# Patient Record
Sex: Male | Born: 1950
Health system: Southern US, Community
[De-identification: ages and names within clinical notes are randomized; demographics above are authoritative.]

## PROBLEM LIST (undated history)

## (undated) DIAGNOSIS — H9190 Unspecified hearing loss, unspecified ear: Secondary | ICD-10-CM

## (undated) DIAGNOSIS — K859 Acute pancreatitis without necrosis or infection, unspecified: Secondary | ICD-10-CM

## (undated) DIAGNOSIS — I10 Essential (primary) hypertension: Secondary | ICD-10-CM

## (undated) HISTORY — PX: CHOLECYSTECTOMY: SHX55

## (undated) HISTORY — PX: JOINT REPLACEMENT: SHX530

---

## 1999-09-22 ENCOUNTER — Encounter: Payer: Self-pay | Admitting: Orthopedic Surgery

## 1999-09-25 ENCOUNTER — Inpatient Hospital Stay (HOSPITAL_COMMUNITY): Admission: RE | Admit: 1999-09-25 | Discharge: 1999-09-29 | Payer: Self-pay | Admitting: Orthopedic Surgery

## 1999-10-25 ENCOUNTER — Encounter: Admission: RE | Admit: 1999-10-25 | Discharge: 2000-01-23 | Payer: Self-pay | Admitting: Orthopedic Surgery

## 1999-11-15 ENCOUNTER — Inpatient Hospital Stay (HOSPITAL_COMMUNITY): Admission: RE | Admit: 1999-11-15 | Discharge: 1999-11-20 | Payer: Self-pay | Admitting: Orthopedic Surgery

## 2001-04-24 ENCOUNTER — Ambulatory Visit (HOSPITAL_COMMUNITY): Admission: RE | Admit: 2001-04-24 | Discharge: 2001-04-24 | Payer: Self-pay | Admitting: *Deleted

## 2002-10-15 ENCOUNTER — Encounter: Payer: Self-pay | Admitting: Emergency Medicine

## 2002-10-16 ENCOUNTER — Inpatient Hospital Stay (HOSPITAL_COMMUNITY): Admission: EM | Admit: 2002-10-16 | Discharge: 2002-10-23 | Payer: Self-pay | Admitting: Emergency Medicine

## 2002-10-17 ENCOUNTER — Encounter: Payer: Self-pay | Admitting: Internal Medicine

## 2004-05-12 ENCOUNTER — Ambulatory Visit (HOSPITAL_COMMUNITY): Admission: RE | Admit: 2004-05-12 | Discharge: 2004-05-12 | Payer: Self-pay | Admitting: *Deleted

## 2015-01-26 ENCOUNTER — Other Ambulatory Visit: Payer: Self-pay | Admitting: Gastroenterology

## 2021-01-02 DIAGNOSIS — I10 Essential (primary) hypertension: Secondary | ICD-10-CM | POA: Diagnosis not present

## 2021-01-02 DIAGNOSIS — R634 Abnormal weight loss: Secondary | ICD-10-CM | POA: Diagnosis not present

## 2021-01-02 DIAGNOSIS — R7301 Impaired fasting glucose: Secondary | ICD-10-CM | POA: Diagnosis not present

## 2021-01-03 ENCOUNTER — Other Ambulatory Visit: Payer: Self-pay | Admitting: Family Medicine

## 2021-01-03 DIAGNOSIS — R6881 Early satiety: Secondary | ICD-10-CM

## 2021-01-03 DIAGNOSIS — R634 Abnormal weight loss: Secondary | ICD-10-CM

## 2021-01-16 ENCOUNTER — Other Ambulatory Visit: Payer: Self-pay

## 2021-01-17 ENCOUNTER — Ambulatory Visit
Admission: RE | Admit: 2021-01-17 | Discharge: 2021-01-17 | Disposition: A | Discharge status: Home / Self Care | Payer: Medicare Other | Source: Ambulatory Visit | Attending: Family Medicine | Admitting: Family Medicine

## 2021-01-17 DIAGNOSIS — R634 Abnormal weight loss: Secondary | ICD-10-CM | POA: Diagnosis not present

## 2021-01-17 DIAGNOSIS — R6881 Early satiety: Secondary | ICD-10-CM | POA: Diagnosis not present

## 2021-01-17 DIAGNOSIS — Z9049 Acquired absence of other specified parts of digestive tract: Secondary | ICD-10-CM | POA: Diagnosis not present

## 2021-01-17 DIAGNOSIS — K7689 Other specified diseases of liver: Secondary | ICD-10-CM | POA: Diagnosis not present

## 2021-01-19 ENCOUNTER — Other Ambulatory Visit: Payer: Self-pay | Admitting: Family Medicine

## 2021-01-19 DIAGNOSIS — R6881 Early satiety: Secondary | ICD-10-CM

## 2021-01-19 DIAGNOSIS — R634 Abnormal weight loss: Secondary | ICD-10-CM

## 2021-01-30 ENCOUNTER — Ambulatory Visit
Admission: RE | Admit: 2021-01-30 | Discharge: 2021-01-30 | Disposition: A | Discharge status: Home / Self Care | Payer: Medicare Other | Source: Ambulatory Visit | Attending: Family Medicine | Admitting: Family Medicine

## 2021-01-30 ENCOUNTER — Other Ambulatory Visit: Payer: Self-pay | Admitting: Family Medicine

## 2021-01-30 DIAGNOSIS — R634 Abnormal weight loss: Secondary | ICD-10-CM

## 2021-01-30 DIAGNOSIS — R6881 Early satiety: Secondary | ICD-10-CM

## 2021-01-30 DIAGNOSIS — R140 Abdominal distension (gaseous): Secondary | ICD-10-CM | POA: Diagnosis not present

## 2021-07-11 DIAGNOSIS — I10 Essential (primary) hypertension: Secondary | ICD-10-CM | POA: Diagnosis not present

## 2021-07-11 DIAGNOSIS — Z1389 Encounter for screening for other disorder: Secondary | ICD-10-CM | POA: Diagnosis not present

## 2021-07-11 DIAGNOSIS — R7301 Impaired fasting glucose: Secondary | ICD-10-CM | POA: Diagnosis not present

## 2021-07-11 DIAGNOSIS — Z0000 Encounter for general adult medical examination without abnormal findings: Secondary | ICD-10-CM | POA: Diagnosis not present

## 2021-08-05 ENCOUNTER — Inpatient Hospital Stay (HOSPITAL_BASED_OUTPATIENT_CLINIC_OR_DEPARTMENT_OTHER)
Admission: EM | Admit: 2021-08-05 | Discharge: 2021-08-12 | DRG: 439 | Disposition: A | Discharge status: Home / Self Care | Payer: Medicare Other | Attending: Internal Medicine | Admitting: Internal Medicine

## 2021-08-05 ENCOUNTER — Other Ambulatory Visit: Payer: Self-pay

## 2021-08-05 ENCOUNTER — Encounter (HOSPITAL_BASED_OUTPATIENT_CLINIC_OR_DEPARTMENT_OTHER): Payer: Self-pay

## 2021-08-05 ENCOUNTER — Inpatient Hospital Stay (HOSPITAL_COMMUNITY): Payer: Medicare Other

## 2021-08-05 ENCOUNTER — Emergency Department (HOSPITAL_BASED_OUTPATIENT_CLINIC_OR_DEPARTMENT_OTHER): Payer: Medicare Other

## 2021-08-05 DIAGNOSIS — K8050 Calculus of bile duct without cholangitis or cholecystitis without obstruction: Secondary | ICD-10-CM | POA: Diagnosis not present

## 2021-08-05 DIAGNOSIS — R7989 Other specified abnormal findings of blood chemistry: Secondary | ICD-10-CM | POA: Diagnosis not present

## 2021-08-05 DIAGNOSIS — Y838 Other surgical procedures as the cause of abnormal reaction of the patient, or of later complication, without mention of misadventure at the time of the procedure: Secondary | ICD-10-CM | POA: Diagnosis present

## 2021-08-05 DIAGNOSIS — Z9049 Acquired absence of other specified parts of digestive tract: Secondary | ICD-10-CM

## 2021-08-05 DIAGNOSIS — K625 Hemorrhage of anus and rectum: Secondary | ICD-10-CM | POA: Diagnosis not present

## 2021-08-05 DIAGNOSIS — K851 Biliary acute pancreatitis without necrosis or infection: Secondary | ICD-10-CM | POA: Diagnosis not present

## 2021-08-05 DIAGNOSIS — R7401 Elevation of levels of liver transaminase levels: Secondary | ICD-10-CM

## 2021-08-05 DIAGNOSIS — K9186 Retained cholelithiasis following cholecystectomy: Secondary | ICD-10-CM | POA: Diagnosis not present

## 2021-08-05 DIAGNOSIS — Z885 Allergy status to narcotic agent status: Secondary | ICD-10-CM | POA: Diagnosis not present

## 2021-08-05 DIAGNOSIS — I10 Essential (primary) hypertension: Secondary | ICD-10-CM

## 2021-08-05 DIAGNOSIS — R109 Unspecified abdominal pain: Secondary | ICD-10-CM | POA: Diagnosis not present

## 2021-08-05 DIAGNOSIS — K8511 Biliary acute pancreatitis with uninfected necrosis: Principal | ICD-10-CM | POA: Diagnosis present

## 2021-08-05 DIAGNOSIS — Z79899 Other long term (current) drug therapy: Secondary | ICD-10-CM

## 2021-08-05 DIAGNOSIS — K828 Other specified diseases of gallbladder: Secondary | ICD-10-CM | POA: Diagnosis not present

## 2021-08-05 DIAGNOSIS — K567 Ileus, unspecified: Secondary | ICD-10-CM

## 2021-08-05 DIAGNOSIS — K8689 Other specified diseases of pancreas: Secondary | ICD-10-CM | POA: Diagnosis not present

## 2021-08-05 DIAGNOSIS — K5900 Constipation, unspecified: Secondary | ICD-10-CM | POA: Diagnosis present

## 2021-08-05 DIAGNOSIS — R17 Unspecified jaundice: Secondary | ICD-10-CM

## 2021-08-05 DIAGNOSIS — Z20822 Contact with and (suspected) exposure to covid-19: Secondary | ICD-10-CM | POA: Diagnosis not present

## 2021-08-05 DIAGNOSIS — H9190 Unspecified hearing loss, unspecified ear: Secondary | ICD-10-CM | POA: Diagnosis present

## 2021-08-05 DIAGNOSIS — K859 Acute pancreatitis without necrosis or infection, unspecified: Secondary | ICD-10-CM | POA: Diagnosis present

## 2021-08-05 DIAGNOSIS — D696 Thrombocytopenia, unspecified: Secondary | ICD-10-CM | POA: Diagnosis not present

## 2021-08-05 DIAGNOSIS — R1110 Vomiting, unspecified: Secondary | ICD-10-CM | POA: Diagnosis not present

## 2021-08-05 DIAGNOSIS — K8020 Calculus of gallbladder without cholecystitis without obstruction: Secondary | ICD-10-CM | POA: Diagnosis not present

## 2021-08-05 DIAGNOSIS — K838 Other specified diseases of biliary tract: Secondary | ICD-10-CM | POA: Diagnosis not present

## 2021-08-05 DIAGNOSIS — R1084 Generalized abdominal pain: Secondary | ICD-10-CM | POA: Diagnosis not present

## 2021-08-05 HISTORY — DX: Essential (primary) hypertension: I10

## 2021-08-05 LAB — COMPREHENSIVE METABOLIC PANEL
ALT: 277 U/L — ABNORMAL HIGH (ref 0–44)
AST: 279 U/L — ABNORMAL HIGH (ref 15–41)
Albumin: 4.1 g/dL (ref 3.5–5.0)
Alkaline Phosphatase: 129 U/L — ABNORMAL HIGH (ref 38–126)
Anion gap: 13 (ref 5–15)
BUN: 17 mg/dL (ref 8–23)
CO2: 24 mmol/L (ref 22–32)
Calcium: 9.8 mg/dL (ref 8.9–10.3)
Chloride: 101 mmol/L (ref 98–111)
Creatinine, Ser: 0.99 mg/dL (ref 0.61–1.24)
GFR, Estimated: >60 mL/min (ref >60)
Glucose, Bld: 147 mg/dL — ABNORMAL HIGH (ref 70–99)
Potassium: 4 mmol/L (ref 3.5–5.1)
Sodium: 138 mmol/L (ref 135–145)
Total Bilirubin: 3.9 mg/dL — ABNORMAL HIGH (ref 0.3–1.2)
Total Protein: 7 g/dL (ref 6.5–8.1)

## 2021-08-05 LAB — CBC WITH DIFFERENTIAL/PLATELET
Abs Immature Granulocytes: 0.04 10*3/uL (ref 0.00–0.07)
Basophils Absolute: 0 10*3/uL (ref 0.0–0.1)
Basophils Relative: 0 %
Eosinophils Absolute: 0 10*3/uL (ref 0.0–0.5)
Eosinophils Relative: 0 %
HCT: 42 % (ref 39.0–52.0)
Hemoglobin: 14.2 g/dL (ref 13.0–17.0)
Immature Granulocytes: 0 %
Lymphocytes Relative: 3 %
Lymphs Abs: 0.4 10*3/uL — ABNORMAL LOW (ref 0.7–4.0)
MCH: 33 pg (ref 26.0–34.0)
MCHC: 33.8 g/dL (ref 30.0–36.0)
MCV: 97.7 fL (ref 80.0–100.0)
Monocytes Absolute: 0.6 10*3/uL (ref 0.1–1.0)
Monocytes Relative: 4 %
Neutro Abs: 12.3 10*3/uL — ABNORMAL HIGH (ref 1.7–7.7)
Neutrophils Relative %: 93 %
Platelets: 153 10*3/uL (ref 150–400)
RBC: 4.3 MIL/uL (ref 4.22–5.81)
RDW: 13.7 % (ref 11.5–15.5)
WBC: 13.2 10*3/uL — ABNORMAL HIGH (ref 4.0–10.5)
nRBC: 0 % (ref 0.0–0.2)

## 2021-08-05 LAB — RESP PANEL BY RT-PCR (FLU A&B, COVID) ARPGX2
Influenza A by PCR: NEGATIVE
Influenza B by PCR: NEGATIVE
SARS Coronavirus 2 by RT PCR: NEGATIVE

## 2021-08-05 LAB — LIPASE, BLOOD: Lipase: 1649 U/L — ABNORMAL HIGH (ref 11–51)

## 2021-08-05 LAB — TRIGLYCERIDES: Triglycerides: 39 mg/dL (ref <150)

## 2021-08-05 MED ORDER — IOHEXOL 350 MG/ML SOLN
50.0000 mL | Freq: Once | INTRAVENOUS | Status: AC | PRN
  Administered 2021-08-05: 50 mL via INTRAVENOUS

## 2021-08-05 MED ORDER — HYDROMORPHONE HCL 1 MG/ML IJ SOLN
0.5000 mg | INTRAMUSCULAR | Status: DC | PRN
  Administered 2021-08-06 – 2021-08-09 (×13): 0.5 mg via INTRAVENOUS
  Filled 2021-08-05 (×13): qty 0.5

## 2021-08-05 MED ORDER — ONDANSETRON HCL 4 MG/2ML IJ SOLN
4.0000 mg | Freq: Once | INTRAMUSCULAR | Status: AC
  Administered 2021-08-05: 4 mg via INTRAVENOUS
  Filled 2021-08-05: qty 2

## 2021-08-05 MED ORDER — HYDROMORPHONE HCL 1 MG/ML IJ SOLN
0.5000 mg | Freq: Once | INTRAMUSCULAR | Status: AC
  Administered 2021-08-05: 0.5 mg via INTRAVENOUS
  Filled 2021-08-05: qty 1

## 2021-08-05 MED ORDER — ONDANSETRON HCL 4 MG/2ML IJ SOLN
4.0000 mg | Freq: Once | INTRAMUSCULAR | Status: AC
  Administered 2021-08-05: 4 mg via INTRAVENOUS

## 2021-08-05 MED ORDER — FENTANYL CITRATE (PF) 100 MCG/2ML IJ SOLN
50.0000 ug | Freq: Once | INTRAMUSCULAR | Status: AC
Start: 2021-08-05 — End: 2021-08-05
  Administered 2021-08-05: 50 ug via INTRAVENOUS
  Filled 2021-08-05: qty 2

## 2021-08-05 MED ORDER — LACTATED RINGERS IV SOLN
INTRAVENOUS | Status: DC
  Administered 2021-08-07: 150 mL/h via INTRAVENOUS

## 2021-08-05 MED ORDER — HYDROMORPHONE HCL 1 MG/ML IJ SOLN: 0.5000 mg | INTRAMUSCULAR | Status: DC | PRN

## 2021-08-05 MED ORDER — ENOXAPARIN SODIUM 40 MG/0.4ML IJ SOSY
40.0000 mg | PREFILLED_SYRINGE | INTRAMUSCULAR | Status: DC
  Administered 2021-08-06 – 2021-08-11 (×6): 40 mg via SUBCUTANEOUS
  Filled 2021-08-05 (×7): qty 0.4

## 2021-08-05 MED ORDER — FENTANYL CITRATE PF 50 MCG/ML IJ SOSY
12.5000 ug | PREFILLED_SYRINGE | Freq: Once | INTRAMUSCULAR | Status: AC
  Administered 2021-08-05: 12.5 ug via INTRAVENOUS
  Filled 2021-08-05: qty 1

## 2021-08-05 MED ORDER — ONDANSETRON HCL 4 MG/2ML IJ SOLN
4.0000 mg | Freq: Once | INTRAMUSCULAR | Status: DC
Start: 2021-08-05 — End: 2021-08-12
  Filled 2021-08-05 (×2): qty 2

## 2021-08-05 MED ORDER — ONDANSETRON HCL 4 MG/2ML IJ SOLN
4.0000 mg | Freq: Four times a day (QID) | INTRAMUSCULAR | Status: DC | PRN
  Administered 2021-08-06 – 2021-08-07 (×4): 4 mg via INTRAVENOUS
  Filled 2021-08-05 (×4): qty 2

## 2021-08-05 MED ORDER — PROCHLORPERAZINE EDISYLATE 10 MG/2ML IJ SOLN
5.0000 mg | Freq: Once | INTRAMUSCULAR | Status: AC
  Administered 2021-08-05: 5 mg via INTRAVENOUS
  Filled 2021-08-05: qty 2

## 2021-08-05 MED ORDER — MORPHINE SULFATE (PF) 2 MG/ML IV SOLN
1.0000 mg | INTRAVENOUS | Status: DC | PRN
  Filled 2021-08-05: qty 1

## 2021-08-05 NOTE — Progress Notes (Signed)
Pt arrived to floor, MD notified.  

## 2021-08-05 NOTE — ED Notes (Signed)
Handoff report given to [facility] 

## 2021-08-05 NOTE — ED Notes (Signed)
Handoff report given to Pharmacologist at [facility]

## 2021-08-05 NOTE — H&P (Signed)
History and Physical    Hunter Ward [id number] DOB: 29-Oct-1951 DOA: 08/05/2021  PCP: Gaynelle Arabian, MD  Patient coming from:[facility]  I have personally briefly reviewed patient's old medical records in [place]  Chief Complaint: abdominal pain  HPI: Hunter Ward is a 70 y.o. male with medical history significant for HTN and cholecystectomy presenting with abdominal pain and N/V.  Pt is deaf and required virtual sign language interpreter.  However her history was limited since there was certain questions that per the interpreter patient did not answer appropriately.  He states he had abdominal pain sometime in the past.  Has had nausea and vomiting.  No diarrhea.  Points to pain in the epigastric area.  Did not answer appropriately when asked medical history and only pointed IV machine wanting pain medication.  Denies any new recent medications.  Denies alcohol use.  [place] unclear of his answer when asked if he had elevated cholesterol levels.   In the ED he was afebrile, normotensive.  Lipase >1600. CT abd showing acute pancreatitis with retained stone in gallbladder fossa and CBD around 39m.  AST of 279, ALT of 277, alkaline phosphatase 129, total bilirubin 3.9. Denies alcohol use. WBC 13.    Review of Systems: Unable to fully obtain due to language barrier even with virtual interpreter Past Medical History:  Diagnosis Date   Hypertension     Past Surgical History:  Procedure Laterality Date   CHOLECYSTECTOMY     JOINT REPLACEMENT Bilateral      reports that he has never smoked. He has never used smokeless tobacco. He reports that he does not drink alcohol and does not use drugs. Social History  Allergies  Allergen Reactions   Codeine Itching and Nausea And Vomiting    History reviewed. No pertinent family history.   Prior to Admission medications   Medication Sig Start Date End Date Taking? Authorizing Provider  lisinopril (ZESTRIL) 20 MG  tablet Take 20 mg by mouth daily. 05/29/21  Yes [provider]  NIFEdipine (PROCARDIA-XL/NIFEDICAL-XL) 30 MG 24 hr tablet Take 30 mg by mouth daily. 05/29/21  Yes [provider]    Physical Exam: Vitals:   08/05/21 1700 08/05/21 1710 08/05/21 1805 08/05/21 2109  BP: 94/71 140/77 139/77 (!) 151/94  Pulse: (!) 102 75 69 68  Resp: '18 14 15 16  '$ Temp:    98.7 F (37.1 C)  TempSrc:    Oral  SpO2: 100% 100% 100% 100%  Weight:      Height:        Constitutional: NAD, calm, elderly male in some discomfort due to abdominal pain Vitals:   08/05/21 1700 08/05/21 1710 08/05/21 1805 08/05/21 2109  BP: 94/71 140/77 139/77 (!) 151/94  Pulse: (!) 102 75 69 68  Resp: '18 14 15 16  '$ Temp:    98.7 F (37.1 C)  TempSrc:    Oral  SpO2: 100% 100% 100% 100%  Weight:      Height:       Eyes: PERRL, lids and conjunctivae normal ENMT: Mucous membranes are moist.  Neck: normal, supple Respiratory: clear to auscultation bilaterally, no wheezing, no crackles. Normal respiratory effort. No accessory muscle use.  Cardiovascular: Regular rate and rhythm, no murmurs / rubs / gallops. No extremity edema.  Abdomen: Diffuse mild tenderness with voluntary guarding but no rebound tenderness, or rigidity, no masses palpated. Bowel sounds positive.  Musculoskeletal: no clubbing / cyanosis. No joint deformity upper and lower extremities. Good ROM, no  contractures. Normal muscle tone.  Skin: scattered linear excoriations on the distal pretibial region of bilateral lower extremity Neurologic: CN 2-12 grossly intact. Sensation intact,  Strength 5/5 in all 4.  Psychiatric:  Alert and oriented x 3. Normal mood.     Labs on Admission: I have personally reviewed following labs and imaging studies  CBC: Recent Labs  Lab 08/05/21 1632  WBC 13.2*  NEUTROABS 12.3*  HGB 14.2  HCT 42.0  MCV 97.7  PLT 0000000   Basic Metabolic Panel: Recent Labs  Lab 08/05/21 1632  NA 138  K 4.0  CL 101  CO2 24   GLUCOSE 147*  BUN 17  CREATININE 0.99  CALCIUM 9.8   GFR: Estimated Creatinine Clearance: 71.5 mL/min (by C-G formula based on SCr of 0.99 mg/dL). Liver Function Tests: Recent Labs  Lab 08/05/21 1632  AST 279*  ALT 277*  ALKPHOS 129*  BILITOT 3.9*  PROT 7.0  ALBUMIN 4.1   Recent Labs  Lab 08/05/21 1632  LIPASE 1,649*   No results for input(s): AMMONIA in the last 168 hours. Coagulation Profile: No results for input(s): INR, PROTIME in the last 168 hours. Cardiac Enzymes: No results for input(s): CKTOTAL, CKMB, CKMBINDEX, TROPONINI in the last 168 hours. BNP (last 3 results) No results for input(s): PROBNP in the last 8760 hours. HbA1C: No results for input(s): HGBA1C in the last 72 hours. CBG: No results for input(s): GLUCAP in the last 168 hours. Lipid Profile: No results for input(s): CHOL, HDL, LDLCALC, TRIG, CHOLHDL, LDLDIRECT in the last 72 hours. Thyroid Function Tests: No results for input(s): TSH, T4TOTAL, FREET4, T3FREE, THYROIDAB in the last 72 hours. Anemia Panel: No results for input(s): VITAMINB12, FOLATE, FERRITIN, TIBC, IRON, RETICCTPCT in the last 72 hours. Urine analysis: No results found for: COLORURINE, APPEARANCEUR, LABSPEC, PHURINE, GLUCOSEU, HGBUR, BILIRUBINUR, KETONESUR, PROTEINUR, UROBILINOGEN, NITRITE, LEUKOCYTESUR  Radiological Exams on Admission: CT ABDOMEN PELVIS W CONTRAST  Result Date: 08/05/2021 CLINICAL DATA:  Persistent vomiting with history of prior cholecystectomy. EXAM: CT ABDOMEN AND PELVIS WITH CONTRAST TECHNIQUE: Multidetector CT imaging of the abdomen and pelvis was performed using the standard protocol following bolus administration of intravenous contrast. CONTRAST:  17m OMNIPAQUE IOHEXOL 350 MG/ML SOLN COMPARISON:  None. FINDINGS: Lower chest: No acute abnormality. Hepatobiliary: No focal liver abnormality is seen. Status post cholecystectomy. 9 mm and 14 mm calcifications are seen within the inferior aspect of the  gallbladder fossa. The common bile duct measures approximately 9 mm. Pancreas: Marked severity peripancreatic inflammatory fat stranding and peripancreatic fluid is seen. This extends to involve the left upper quadrant and mid and lower portions of the right abdomen. Diffuse enlargement of the pancreatic head and uncinate process is also noted (approximately 3.6 cm x 3.6 cm (axial CT image 35, CT series 2). Spleen: Normal in size without focal abnormality. Adrenals/Urinary Tract: A 2.4 cm x 1.9 cm well-defined low-attenuation (approximately 15.91 Hounsfield units) left adrenal mass is noted. The right adrenal gland is normal in size and appearance. Kidneys are normal, without renal calculi, focal lesion, or hydronephrosis. Bladder is unremarkable. Stomach/Bowel: There is a small hiatal hernia. Appendix appears normal. No evidence of bowel dilatation. Vascular/Lymphatic: Aortic atherosclerosis. No enlarged abdominal or pelvic lymph nodes. Reproductive: There is mild to moderate severity prostate gland enlargement. Other: No abdominal wall hernia or abnormality. No abdominopelvic ascites. Musculoskeletal: Multilevel degenerative changes are seen throughout the lumbar spine. IMPRESSION: 1. Marked severity acute pancreatitis with associated inflammatory changes. 2. Small retained gallstones within the gallbladder fossa, status  post cholecystectomy. 3. Left adrenal mass which may represent an adrenal adenoma. 4. Small hiatal hernia. 5. Mild to moderate severity prostate gland enlargement. 6. Aortic atherosclerosis. Aortic Atherosclerosis (ICD10-I70.0). Electronically Signed   By: Virgina Norfolk M.D.   On: 08/05/2021 18:18      Assessment/Plan  Acute pancreatitis -Likely secondary to gallstones.  Has a history of Coley cystectomy but with gallstones present in fossa.  LFTs and total bilirubin also elevated concerning for possible biliary obstruction although CT abdomen does not show any apparent dilatation.   Will obtain right upper quadrant ultrasound -check triglyceride level -Continuous IV 150 cc/h fluid.  Clear liquid diet as tolerated. -PRN pain control with Dilaudid-has received some in ER and tolerated well despite it being on allergy list   Transaminitis/elevated total bilirubin - Obtain right upper quadrant ultrasound as above - Monitor with repeat CMP in the morning  HTN -controlled  DVT prophylaxis:.Lovenox Code Status: Full Family Communication: Plan discussed with patient at bedside. No family at bedside disposition Plan: Home with at least 2 midnight stays  Consults called:  Admission status: inpatient  Level of care: Med-Surg  Status is: Inpatient  Remains inpatient appropriate because:Inpatient level of care appropriate due to severity of illness  Dispo: The patient is from: Home              Anticipated d/c is to: Home              Patient currently is not medically stable to d/c.   Difficult to place patient No         Orene Desanctis DO [facility]   If 7PM-7AM, please contact night-coverage [email or web address]   08/05/2021, 10:40 PM

## 2021-08-05 NOTE — ED Notes (Signed)
[facility] at bedside 

## 2021-08-05 NOTE — ED Notes (Signed)
Patient transported to CT 

## 2021-08-05 NOTE — ED Provider Notes (Signed)
[facility] EMERGENCY DEPT Provider Note   CSN: [id number] Arrival date & time: 08/05/21  1601     History Chief Complaint  Patient presents with   Abdominal Pain   Emesis   Patient is deaf.  Speaks American sign language.  Child psychotherapist used. Hunter Ward is a 70 y.o. male.   Abdominal Pain Associated symptoms: nausea and vomiting   Associated symptoms: no chest pain and no shortness of breath   Emesis Associated symptoms: abdominal pain   Patient with abdominal pain and vomiting.  Began this morning.  Reportedly was feeling well yesterday.  Pain is somewhat up in his throat also diffusely in his abdomen.  Has been vomiting.  Vomiting does not make the pain feel better.  No diarrhea or constipation.  Abdomen not more swollen.  Has had previous cholecystectomy.  No fevers or chills.  Pain is dull and crampy at times.  It is diffuse over the abdomen.    Past Medical History:  Diagnosis Date   Hypertension     There are no problems to display for this patient.   Past Surgical History:  Procedure Laterality Date   CHOLECYSTECTOMY     JOINT REPLACEMENT Bilateral        History reviewed. No pertinent family history.  Social History   Tobacco Use   Smoking status: Never   Smokeless tobacco: Never  Vaping Use   Vaping Use: Never used  Substance Use Topics   Alcohol use: Never   Drug use: Never    Home Medications Prior to Admission medications   Medication Sig Start Date End Date Taking? Authorizing Provider  lisinopril (ZESTRIL) 20 MG tablet Take 20 mg by mouth daily. 05/29/21   [provider]  NIFEdipine (PROCARDIA-XL/NIFEDICAL-XL) 30 MG 24 hr tablet Take 30 mg by mouth daily. 05/29/21   [provider]    Allergies    Patient has no known allergies.  Review of Systems   Review of Systems  Constitutional:  Negative for appetite change.  HENT:  Negative for congestion.   Respiratory:  Negative for chest tightness  and shortness of breath.   Cardiovascular:  Negative for chest pain.  Gastrointestinal:  Positive for abdominal pain, nausea and vomiting.  Genitourinary:  Negative for urgency.  Musculoskeletal:  Negative for back pain.  Skin:  Negative for rash.  Neurological:  Negative for weakness.  Psychiatric/Behavioral:  Negative for confusion.    Physical Exam Updated Vital Signs BP 139/77   Pulse 69   Temp 98.3 F (36.8 C)   Resp 15   Ht '6\' 1"'$  (1.854 m)   Wt 73.9 kg   SpO2 100%   BMI 21.51 kg/m   Physical Exam Vitals and nursing note reviewed.  Constitutional:      Comments: Patient appears uncomfortable.  HENT:     Head: Atraumatic.  Cardiovascular:     Rate and Rhythm: Normal rate.  Pulmonary:     Breath sounds: No wheezing, rhonchi or rales.  Chest:     Chest wall: No tenderness.  Abdominal:     General: There is no distension.     Tenderness: There is abdominal tenderness.     Hernia: No hernia is present.     Comments: Diffuse abdominal tenderness.  No hernia seen.  No distention.  No mass palpated.  Skin:    General: Skin is warm.     Capillary Refill: Capillary refill takes less than 2 seconds.  Neurological:     Mental  Status: He is alert and oriented to person, place, and time.    ED Results / Procedures / Treatments   Labs (all labs ordered are listed, but only abnormal results are displayed) Labs Reviewed  COMPREHENSIVE METABOLIC PANEL - Abnormal; Notable for the following components:      Result Value   Glucose, Bld 147 (*)    AST 279 (*)    ALT 277 (*)    Alkaline Phosphatase 129 (*)    Total Bilirubin 3.9 (*)    All other components within normal limits  LIPASE, BLOOD - Abnormal; Notable for the following components:   Lipase 1,649 (*)    All other components within normal limits  CBC WITH DIFFERENTIAL/PLATELET - Abnormal; Notable for the following components:   WBC 13.2 (*)    Neutro Abs 12.3 (*)    Lymphs Abs 0.4 (*)    All other components  within normal limits  RESP PANEL BY RT-PCR (FLU A&B, COVID) [id number]  URINALYSIS, ROUTINE W REFLEX MICROSCOPIC    EKG None  Radiology CT ABDOMEN PELVIS W CONTRAST  Result Date: 08/05/2021 CLINICAL DATA:  Persistent vomiting with history of prior cholecystectomy. EXAM: CT ABDOMEN AND PELVIS WITH CONTRAST TECHNIQUE: Multidetector CT imaging of the abdomen and pelvis was performed using the standard protocol following bolus administration of intravenous contrast. CONTRAST:  107m OMNIPAQUE IOHEXOL 350 MG/ML SOLN COMPARISON:  None. FINDINGS: Lower chest: No acute abnormality. Hepatobiliary: No focal liver abnormality is seen. Status post cholecystectomy. 9 mm and 14 mm calcifications are seen within the inferior aspect of the gallbladder fossa. The common bile duct measures approximately 9 mm. Pancreas: Marked severity peripancreatic inflammatory fat stranding and peripancreatic fluid is seen. This extends to involve the left upper quadrant and mid and lower portions of the right abdomen. Diffuse enlargement of the pancreatic head and uncinate process is also noted (approximately 3.6 cm x 3.6 cm (axial CT image 35, CT series 2). Spleen: Normal in size without focal abnormality. Adrenals/Urinary Tract: A 2.4 cm x 1.9 cm well-defined low-attenuation (approximately 15.91 Hounsfield units) left adrenal mass is noted. The right adrenal gland is normal in size and appearance. Kidneys are normal, without renal calculi, focal lesion, or hydronephrosis. Bladder is unremarkable. Stomach/Bowel: There is a small hiatal hernia. Appendix appears normal. No evidence of bowel dilatation. Vascular/Lymphatic: Aortic atherosclerosis. No enlarged abdominal or pelvic lymph nodes. Reproductive: There is mild to moderate severity prostate gland enlargement. Other: No abdominal wall hernia or abnormality. No abdominopelvic ascites. Musculoskeletal: Multilevel degenerative changes are seen throughout the lumbar spine. IMPRESSION: 1.  Marked severity acute pancreatitis with associated inflammatory changes. 2. Small retained gallstones within the gallbladder fossa, status post cholecystectomy. 3. Left adrenal mass which may represent an adrenal adenoma. 4. Small hiatal hernia. 5. Mild to moderate severity prostate gland enlargement. 6. Aortic atherosclerosis. Aortic Atherosclerosis (ICD10-I70.0). Electronically Signed   By: TVirgina NorfolkM.D.   On: 08/05/2021 18:18    Procedures Procedures   Medications Ordered in ED Medications  ondansetron (ZOFRAN) injection 4 mg (4 mg Intravenous Not Given 08/05/21 1745)  fentaNYL (SUBLIMAZE) injection 50 mcg (50 mcg Intravenous Given 08/05/21 1703)  ondansetron (ZOFRAN) injection 4 mg (4 mg Intravenous Given 08/05/21 1703)  ondansetron (ZOFRAN) injection 4 mg (4 mg Intravenous Given 08/05/21 1745)  iohexol (OMNIPAQUE) 350 MG/ML injection 50 mL (50 mLs Intravenous Contrast Given 08/05/21 1744)    ED Course  I have reviewed the triage vital signs and the nursing notes.  Pertinent labs & imaging results  that were available during my care of the patient were reviewed by me and considered in my medical decision making (see chart for details).    MDM Rules/Calculators/A&P                           Patient abdominal pain.  Began this morning.  Has had vomiting.  White count is elevated.  Has pancreatitis and LFTs are elevated.  No fever.  Does have some retained gallstones after cholecystectomy.  CT scan showed acute pancreatitis.  Continued pain and vomiting.  Will admit to hospitalist.  Reportedly has seen by [facility] in the past Final Clinical Impression(s) / ED Diagnoses Final diagnoses:  Acute pancreatitis, unspecified complication status, unspecified pancreatitis type    Rx / DC Orders ED Discharge Orders     None        Davonna Belling, MD 08/05/21 1846

## 2021-08-05 NOTE — ED Triage Notes (Signed)
Vomiting constantly since 0600 this morning, patient is deaf but can read lips and use sign language, patient's niece is in the room and assisting with patient's history.

## 2021-08-06 ENCOUNTER — Inpatient Hospital Stay (HOSPITAL_COMMUNITY): Payer: Medicare Other

## 2021-08-06 DIAGNOSIS — K859 Acute pancreatitis without necrosis or infection, unspecified: Secondary | ICD-10-CM

## 2021-08-06 DIAGNOSIS — I10 Essential (primary) hypertension: Secondary | ICD-10-CM

## 2021-08-06 DIAGNOSIS — R17 Unspecified jaundice: Secondary | ICD-10-CM

## 2021-08-06 DIAGNOSIS — R7401 Elevation of levels of liver transaminase levels: Secondary | ICD-10-CM

## 2021-08-06 LAB — LIPASE, BLOOD: Lipase: 771 U/L — ABNORMAL HIGH (ref 11–51)

## 2021-08-06 LAB — COMPREHENSIVE METABOLIC PANEL
ALT: 239 U/L — ABNORMAL HIGH (ref 0–44)
AST: 220 U/L — ABNORMAL HIGH (ref 15–41)
Albumin: 3.9 g/dL (ref 3.5–5.0)
Alkaline Phosphatase: 123 U/L (ref 38–126)
Anion gap: 8 (ref 5–15)
BUN: 22 mg/dL (ref 8–23)
CO2: 25 mmol/L (ref 22–32)
Calcium: 9.1 mg/dL (ref 8.9–10.3)
Chloride: 104 mmol/L (ref 98–111)
Creatinine, Ser: 0.86 mg/dL (ref 0.61–1.24)
GFR, Estimated: >60 mL/min (ref >60)
Glucose, Bld: 129 mg/dL — ABNORMAL HIGH (ref 70–99)
Potassium: 4.2 mmol/L (ref 3.5–5.1)
Sodium: 137 mmol/L (ref 135–145)
Total Bilirubin: 3.6 mg/dL — ABNORMAL HIGH (ref 0.3–1.2)
Total Protein: 6.6 g/dL (ref 6.5–8.1)

## 2021-08-06 LAB — CBC
HCT: 43.9 % (ref 39.0–52.0)
Hemoglobin: 14.9 g/dL (ref 13.0–17.0)
MCH: 33.3 pg (ref 26.0–34.0)
MCHC: 33.9 g/dL (ref 30.0–36.0)
MCV: 98 fL (ref 80.0–100.0)
Platelets: 144 10*3/uL — ABNORMAL LOW (ref 150–400)
RBC: 4.48 MIL/uL (ref 4.22–5.81)
RDW: 13.7 % (ref 11.5–15.5)
WBC: 19.8 10*3/uL — ABNORMAL HIGH (ref 4.0–10.5)
nRBC: 0 % (ref 0.0–0.2)

## 2021-08-06 MED ORDER — BISACODYL 10 MG RE SUPP
10.0000 mg | Freq: Once | RECTAL | Status: DC
  Filled 2021-08-06: qty 1

## 2021-08-06 MED ORDER — GADOBUTROL 1 MMOL/ML IV SOLN
7.5000 mL | Freq: Once | INTRAVENOUS | Status: AC | PRN
  Administered 2021-08-06: 7.5 mL via INTRAVENOUS

## 2021-08-06 NOTE — Progress Notes (Signed)
Unable to complete admission questions due to communication difficulties. Pt appears confused about his care. Per interpreter, pt is not answering questions appropriately.

## 2021-08-06 NOTE — Consult Note (Signed)
Referring Provider: Dr. Tana Coast Primary Care Physician:  Gaynelle Arabian, MD Primary Gastroenterologist:  Althia Forts  Reason for Consultation:  Elevated LFTs; Pancreatitis  HPI: Hunter Ward is a 70 y.o. male (history obtained with a sign language interpretor virtually) with a remote history of a cholecystectomy about 30 years ago per his niece who had the acute onset of profuse nonbloody vomiting and crampy upper quadrant abdominal pain that started 2 days ago. No BM in "a while." Not able to tell me how long ago. Denies rectal bleeding or diarrhea. Denies alcohol. Niece reports that he had pancreatitis back when he had his GB removed and no known pancreatitis since then until now. She reports that he has been trying to lose weight and has lost 50 pounds this year, which he disagrees with her assessment. Lipase 1649, TB 3.9, ALP 129, AST 279, ALT 277.  CT showed severe acute pancreatitis and small retained gallstones within the GB fossa. Left adrenal mass also seen.   Past Medical History:  Diagnosis Date   Hypertension     Past Surgical History:  Procedure Laterality Date   CHOLECYSTECTOMY     JOINT REPLACEMENT Bilateral     Prior to Admission medications   Medication Sig Start Date End Date Taking? Authorizing Provider  lisinopril (ZESTRIL) 20 MG tablet Take 20 mg by mouth daily. 05/29/21  Yes [provider]  NIFEdipine (PROCARDIA-XL/NIFEDICAL-XL) 30 MG 24 hr tablet Take 30 mg by mouth daily. 05/29/21  Yes [provider]    Scheduled Meds:  bisacodyl  10 mg Rectal Once   enoxaparin (LOVENOX) injection  40 mg Subcutaneous Q24H   ondansetron (ZOFRAN) IV  4 mg Intravenous Once   Continuous Infusions:  lactated ringers 150 mL/hr at 08/06/21 0819   PRN Meds:.HYDROmorphone (DILAUDID) injection, ondansetron (ZOFRAN) IV  Allergies as of 08/05/2021 - Review Complete 08/05/2021  Allergen Reaction Noted   Codeine Itching and Nausea And Vomiting 08/05/2021    History  reviewed. No pertinent family history.  Social History   Socioeconomic History   Marital status: Single    Spouse name: Not on file   Number of children: Not on file   Years of education: Not on file   Highest education level: Not on file  Occupational History   Not on file  Tobacco Use   Smoking status: Never   Smokeless tobacco: Never  Vaping Use   Vaping Use: Never used  Substance and Sexual Activity   Alcohol use: Never   Drug use: Never   Sexual activity: Not on file  Other Topics Concern   Not on file  Social History Narrative   Not on file   Social Determinants of Health   Financial Resource Strain: Not on file  Food Insecurity: Not on file  Transportation Needs: Not on file  Physical Activity: Not on file  Stress: Not on file  Social Connections: Not on file  Intimate Partner Violence: Not on file    Review of Systems: All negative except as stated above in HPI.  Physical Exam: Vital signs: Vitals:   08/06/21 0550 08/06/21 0938  BP: (!) 158/85 (!) 145/87  Pulse: 73 71  Resp: 18 18  Temp: 98.6 F (37 C) 99.2 F (37.3 C)  SpO2: 99% 99%     General:   Lethargic, elderly, thin, no acute distress, pleasant  Head: normocephalic, atraumatic Eyes: anicteric sclera ENT: oropharynx clear Neck: supple, nontender Lungs:  Clear throughout to auscultation.   No wheezes, crackles, or rhonchi.  No acute distress. Heart:  Regular rate and rhythm; no murmurs, clicks, rubs,  or gallops. Abdomen: RUQ and epigastric tenderness with guarding, soft, nondistended, +BS  Rectal:  Deferred Ext: no edema  GI:  Lab Results: Recent Labs    08/05/21 1632 08/06/21 0459  WBC 13.2* 19.8*  HGB 14.2 14.9  HCT 42.0 43.9  PLT 153 144*   BMET Recent Labs    08/05/21 1632 08/06/21 0459  NA 138 137  K 4.0 4.2  CL 101 104  CO2 24 25  GLUCOSE 147* 129*  BUN 17 22  CREATININE 0.99 0.86  CALCIUM 9.8 9.1   LFT Recent Labs    08/06/21 0459  PROT 6.6  ALBUMIN 3.9   AST 220*  ALT 239*  ALKPHOS 123  BILITOT 3.6*   PT/INR No results for input(s): LABPROT, INR in the last 72 hours.   Studies/Results: CT ABDOMEN PELVIS W CONTRAST  Result Date: 08/05/2021 CLINICAL DATA:  Persistent vomiting with history of prior cholecystectomy. EXAM: CT ABDOMEN AND PELVIS WITH CONTRAST TECHNIQUE: Multidetector CT imaging of the abdomen and pelvis was performed using the standard protocol following bolus administration of intravenous contrast. CONTRAST:  81m OMNIPAQUE IOHEXOL 350 MG/ML SOLN COMPARISON:  None. FINDINGS: Lower chest: No acute abnormality. Hepatobiliary: No focal liver abnormality is seen. Status post cholecystectomy. 9 mm and 14 mm calcifications are seen within the inferior aspect of the gallbladder fossa. The common bile duct measures approximately 9 mm. Pancreas: Marked severity peripancreatic inflammatory fat stranding and peripancreatic fluid is seen. This extends to involve the left upper quadrant and mid and lower portions of the right abdomen. Diffuse enlargement of the pancreatic head and uncinate process is also noted (approximately 3.6 cm x 3.6 cm (axial CT image 35, CT series 2). Spleen: Normal in size without focal abnormality. Adrenals/Urinary Tract: A 2.4 cm x 1.9 cm well-defined low-attenuation (approximately 15.91 Hounsfield units) left adrenal mass is noted. The right adrenal gland is normal in size and appearance. Kidneys are normal, without renal calculi, focal lesion, or hydronephrosis. Bladder is unremarkable. Stomach/Bowel: There is a small hiatal hernia. Appendix appears normal. No evidence of bowel dilatation. Vascular/Lymphatic: Aortic atherosclerosis. No enlarged abdominal or pelvic lymph nodes. Reproductive: There is mild to moderate severity prostate gland enlargement. Other: No abdominal wall hernia or abnormality. No abdominopelvic ascites. Musculoskeletal: Multilevel degenerative changes are seen throughout the lumbar spine. IMPRESSION:  1. Marked severity acute pancreatitis with associated inflammatory changes. 2. Small retained gallstones within the gallbladder fossa, status post cholecystectomy. 3. Left adrenal mass which may represent an adrenal adenoma. 4. Small hiatal hernia. 5. Mild to moderate severity prostate gland enlargement. 6. Aortic atherosclerosis. Aortic Atherosclerosis (ICD10-I70.0). Electronically Signed   By: TVirgina NorfolkM.D.   On: 08/05/2021 18:18   UKoreaAbdomen Limited RUQ (LIVER/GB)  Result Date: 08/06/2021 CLINICAL DATA:  Elevated serum total bilirubin levels. Prior cholecystectomy. EXAM: ULTRASOUND ABDOMEN LIMITED RIGHT UPPER QUADRANT COMPARISON:  January 17, 2021 FINDINGS: Gallbladder: The gallbladder is surgically absent. A 1.2 cm shadowing echogenic focus is noted within the gallbladder fossa. Common bile duct: Diameter: 15.1 mm Liver: No focal lesion identified. Within normal limits in parenchymal echogenicity. Portal vein is patent on color Doppler imaging with normal direction of blood flow towards the liver. Other: None. IMPRESSION: 1. Evidence of prior cholecystectomy with a retained gallstones suspected within the gallbladder fossa. Electronically Signed   By: TVirgina NorfolkM.D.   On: 08/06/2021 00:56    Impression/Plan: Severe acute pancreatitis question retained gallstones (  vs primary biliary stone). Needs an MRCP and he thinks he can hold his breath to get an adequate study. If MRCP shows a CBD stone, then will need an ERCP in the near future. Discussed risks/benefits of an ERCP with him using the interpretor and with his niece and answered all of their questions. NPO. Supportive care. Dr. Therisa Doyne will f/u tomorrow.    LOS: 1 day   [name]  08/06/2021, 12:37 PM  Questions please call [phone]

## 2021-08-06 NOTE — Progress Notes (Signed)
[facility]                                                                              Patient Demographics  Hunter Ward, is a 70 y.o. male, DOB - [birth year], NAT:[id number]  Admit date - 08/05/2021   Admitting Physician Orene Desanctis, DO  Outpatient Primary MD for the patient is Gaynelle Arabian, MD  Outpatient specialists:   LOS - 1  days   Medical records reviewed and are as summarized below:    Chief Complaint  Patient presents with   Abdominal Pain   Emesis       Brief summary   70 year old male with hypertension, prior cholecystectomy presented with nausea vomiting and abdominal pain. + Constipation In ED, afebrile, normotensive, lipase > 1600 CT abdomen showed acute pancreatitis with retained stone in gallbladder fossa, CBD 9 mm. Elevated LFTs AST 279, ALT 277, alk phos 129, bilirubin 3.9.  No alcohol use   Assessment & Plan    Principal Problem:   Acute gallstone pancreatitis with transaminitis -CT abdomen showed acute pancreatitis with retained stone in gallbladder fossa, CBD 9 mm, presented with nausea, vomiting, abdominal pain with elevated LFTs. -Triglycerides 39, no history of alcohol use -Continue n.p.o., IV fluids, pain control, antiemetics -GI consulted, ([facility], Dr. Michail Sermon), may need MRCP/ERCP, will defer to GI  Active Problems:   HTN (hypertension) -BP controlled  Constipation -Placed on Dulcolax suppository -Placed on bowel regimen once tolerating diet    Code Status: Full CODE STATUS DVT Prophylaxis:  enoxaparin (LOVENOX) injection 40 mg Start: 08/05/21 2230   Level of Care: Level of care: Med-Surg Family Communication: Discussed all imaging results, lab results, explained to the patient   Patient was examined and all questions/concerns were addressed with the assistance of ASL interpreter in the presence of RN. Disposition Plan:     Status is: Inpatient  Remains inpatient appropriate because:Inpatient level of  care appropriate due to severity of illness  Dispo: The patient is from: Home              Anticipated d/c is to: Home              Patient currently is not medically stable to d/c.   Difficult to place patient No      Time Spent in minutes   25 minutes  Procedures:  None  Consultants:   Gastroenterology  Antimicrobials:   Anti-infectives (From admission, onward)    None          Medications  Scheduled Meds:  enoxaparin (LOVENOX) injection  40 mg Subcutaneous Q24H   ondansetron (ZOFRAN) IV  4 mg Intravenous Once   Continuous Infusions:  lactated ringers 150 mL/hr at 08/06/21 0819   PRN Meds:.HYDROmorphone (DILAUDID) injection, ondansetron (ZOFRAN) IV      Subjective:   Hunter Ward was seen and examined today with assistance of ASL interpreter.  Still has right upper quadrant pain, nausea but no active vomiting.  Feels constipated.  No fevers or chills.  No chest pain or shortness of breath.   Objective:   Vitals:   08/05/21 2109 08/06/21 4270 08/06/21 0550 08/06/21 6237  BP: (!) 151/94 (!) 145/86 (!) 158/85 (!) 145/87  Pulse: 68 74 73 71  Resp: 16 16 18 18   Temp: 98.7 F (37.1 C) 97.8 F (36.6 C) 98.6 F (37 C) 99.2 F (37.3 C)  TempSrc: Oral Oral Oral Oral  SpO2: 100% 100% 99% 99%  Weight:      Height:        Intake/Output Summary (Last 24 hours) at 08/06/2021 1036 Last data filed at 08/06/2021 1000 Gross per 24 hour  Intake 1264.21 ml  Output 550 ml  Net 714.21 ml     Wt Readings from Last 3 Encounters:  08/05/21 73.9 kg     Exam General: Alert and oriented x 3, NAD, deaf Cardiovascular: S1 S2 auscultated,  RRR Respiratory: CTA B Gastrointestinal: Soft, RUQ TTP  nondistended, + BS Ext: no pedal edema bilaterally Neuro: no new deficits Skin: No rashes Psych: Normal affect and demeanor, alert and oriented x3    Data Reviewed:  I have personally reviewed following labs and imaging studies  Micro Results Recent Results (from  the past 240 hour(s))  Resp Panel by RT-PCR (Flu A&B, Covid) Nasopharyngeal Swab     Status: None   Collection Time: 08/05/21  6:46 PM   Specimen: Nasopharyngeal Swab; Nasopharyngeal(NP) swabs in vial transport medium  Result Value Ref Range Status   SARS Coronavirus 2 by RT PCR NEGATIVE NEGATIVE Final    Comment: (NOTE) SARS-CoV-2 target nucleic acids are NOT DETECTED.  The SARS-CoV-2 RNA is generally detectable in upper respiratory specimens during the acute phase of infection. The lowest concentration of SARS-CoV-2 viral copies this assay can detect is 138 copies/mL. A negative result does not preclude SARS-Cov-2 infection and should not be used as the sole basis for treatment or other patient management decisions. A negative result may occur with  improper specimen collection/handling, submission of specimen other than nasopharyngeal swab, presence of viral mutation(s) within the areas targeted by this assay, and inadequate number of viral copies(<138 copies/mL). A negative result must be combined with clinical observations, patient history, and epidemiological information. The expected result is Negative.  Fact Sheet for Patients:  EntrepreneurPulse.com.au  Fact Sheet for Healthcare Providers:  IncredibleEmployment.be  This test is no t yet approved or cleared by the [place] FDA and  has been authorized for detection and/or diagnosis of SARS-CoV-2 by FDA under an Emergency Use Authorization (EUA). This EUA will remain  in effect (meaning this test can be used) for the duration of the COVID-19 declaration under Section 564(b)(1) of the Act, 21 U.S.C.section 360bbb-3(b)(1), unless the authorization is terminated  or revoked sooner.       Influenza A by PCR NEGATIVE NEGATIVE Final   Influenza B by PCR NEGATIVE NEGATIVE Final    Comment: (NOTE) The Xpert Xpress SARS-CoV-2/FLU/RSV plus assay is intended as an aid in the diagnosis of  influenza from Nasopharyngeal swab specimens and should not be used as a sole basis for treatment. Nasal washings and aspirates are unacceptable for Xpert Xpress SARS-CoV-2/FLU/RSV testing.  Fact Sheet for Patients: EntrepreneurPulse.com.au  Fact Sheet for Healthcare Providers: IncredibleEmployment.be  This test is not yet approved or cleared by the [place] FDA and has been authorized for detection and/or diagnosis of SARS-CoV-2 by FDA under an Emergency Use Authorization (EUA). This EUA will remain in effect (meaning this test can be used) for the duration of the COVID-19 declaration under Section 564(b)(1) of the Act, 21 U.S.C. section 360bbb-3(b)(1), unless the authorization is terminated or revoked.  Performed  at [facility], [address], [place], [place] [address]     Radiology Reports CT ABDOMEN PELVIS W CONTRAST  Result Date: 08/05/2021 CLINICAL DATA:  Persistent vomiting with history of prior cholecystectomy. EXAM: CT ABDOMEN AND PELVIS WITH CONTRAST TECHNIQUE: Multidetector CT imaging of the abdomen and pelvis was performed using the standard protocol following bolus administration of intravenous contrast. CONTRAST:  62m OMNIPAQUE IOHEXOL 350 MG/ML SOLN COMPARISON:  None. FINDINGS: Lower chest: No acute abnormality. Hepatobiliary: No focal liver abnormality is seen. Status post cholecystectomy. 9 mm and 14 mm calcifications are seen within the inferior aspect of the gallbladder fossa. The common bile duct measures approximately 9 mm. Pancreas: Marked severity peripancreatic inflammatory fat stranding and peripancreatic fluid is seen. This extends to involve the left upper quadrant and mid and lower portions of the right abdomen. Diffuse enlargement of the pancreatic head and uncinate process is also noted (approximately 3.6 cm x 3.6 cm (axial CT image 35, CT series 2). Spleen: Normal in size without focal  abnormality. Adrenals/Urinary Tract: A 2.4 cm x 1.9 cm well-defined low-attenuation (approximately 15.91 Hounsfield units) left adrenal mass is noted. The right adrenal gland is normal in size and appearance. Kidneys are normal, without renal calculi, focal lesion, or hydronephrosis. Bladder is unremarkable. Stomach/Bowel: There is a small hiatal hernia. Appendix appears normal. No evidence of bowel dilatation. Vascular/Lymphatic: Aortic atherosclerosis. No enlarged abdominal or pelvic lymph nodes. Reproductive: There is mild to moderate severity prostate gland enlargement. Other: No abdominal wall hernia or abnormality. No abdominopelvic ascites. Musculoskeletal: Multilevel degenerative changes are seen throughout the lumbar spine. IMPRESSION: 1. Marked severity acute pancreatitis with associated inflammatory changes. 2. Small retained gallstones within the gallbladder fossa, status post cholecystectomy. 3. Left adrenal mass which may represent an adrenal adenoma. 4. Small hiatal hernia. 5. Mild to moderate severity prostate gland enlargement. 6. Aortic atherosclerosis. Aortic Atherosclerosis (ICD10-I70.0). Electronically Signed   By: TVirgina NorfolkM.D.   On: 08/05/2021 18:18   [place]domen Limited RUQ (LIVER/GB)  Result Date: 08/06/2021 CLINICAL DATA:  Elevated serum total bilirubin levels. Prior cholecystectomy. EXAM: ULTRASOUND ABDOMEN LIMITED RIGHT UPPER QUADRANT COMPARISON:  January 17, 2021 FINDINGS: Gallbladder: The gallbladder is surgically absent. A 1.2 cm shadowing echogenic focus is noted within the gallbladder fossa. Common bile duct: Diameter: 15.1 mm Liver: No focal lesion identified. Within normal limits in parenchymal echogenicity. Portal vein is patent on color Doppler imaging with normal direction of blood flow towards the liver. Other: None. IMPRESSION: 1. Evidence of prior cholecystectomy with a retained gallstones suspected within the gallbladder fossa. Electronically Signed   By:  TVirgina NorfolkM.D.   On: 08/06/2021 00:56    Lab Data:  CBC: Recent Labs  Lab 08/05/21 1632 08/06/21 0459  WBC 13.2* 19.8*  NEUTROABS 12.3*  --   HGB 14.2 14.9  HCT 42.0 43.9  MCV 97.7 98.0  PLT 153 1935   Basic Metabolic Panel: Recent Labs  Lab 08/05/21 1632 08/06/21 0459  NA 138 137  K 4.0 4.2  CL 101 104  CO2 24 25  GLUCOSE 147* 129*  BUN 17 22  CREATININE 0.99 0.86  CALCIUM 9.8 9.1   GFR: Estimated Creatinine Clearance: 82.3 mL/min (by C-G formula based on SCr of 0.86 mg/dL). Liver Function Tests: Recent Labs  Lab 08/05/21 1632 08/06/21 0459  AST 279* 220*  ALT 277* 239*  ALKPHOS 129* 123  BILITOT 3.9* 3.6*  PROT 7.0 6.6  ALBUMIN 4.1 3.9   Recent Labs  Lab 08/05/21  1632 08/06/21 0459  LIPASE 1,649* 771*   No results for input(s): AMMONIA in the last 168 hours. Coagulation Profile: No results for input(s): INR, PROTIME in the last 168 hours. Cardiac Enzymes: No results for input(s): CKTOTAL, CKMB, CKMBINDEX, TROPONINI in the last 168 hours. BNP (last 3 results) No results for input(s): PROBNP in the last 8760 hours. HbA1C: No results for input(s): HGBA1C in the last 72 hours. CBG: No results for input(s): GLUCAP in the last 168 hours. Lipid Profile: Recent Labs    08/05/21 2230  TRIG 39   Thyroid Function Tests: No results for input(s): TSH, T4TOTAL, FREET4, T3FREE, THYROIDAB in the last 72 hours. Anemia Panel: No results for input(s): VITAMINB12, FOLATE, FERRITIN, TIBC, IRON, RETICCTPCT in the last 72 hours. Urine analysis: No results found for: COLORURINE, APPEARANCEUR, LABSPEC, PHURINE, GLUCOSEU, HGBUR, BILIRUBINUR, KETONESUR, PROTEINUR, UROBILINOGEN, NITRITE, LEUKOCYTESUR   Garen Woolbright M.D. [facility] 08/06/2021, 10:36 AM  Available via Epic secure chat 7am-7pm After 7 pm, please refer to night coverage provider listed on amion.

## 2021-08-07 ENCOUNTER — Encounter (HOSPITAL_COMMUNITY)
Admission: EM | Disposition: A | Discharge status: Home / Self Care | Payer: Self-pay | Source: Home / Self Care | Attending: Internal Medicine

## 2021-08-07 ENCOUNTER — Inpatient Hospital Stay (HOSPITAL_COMMUNITY): Payer: Medicare Other | Admitting: Certified Registered Nurse Anesthetist

## 2021-08-07 ENCOUNTER — Encounter (HOSPITAL_COMMUNITY): Payer: Self-pay | Admitting: Family Medicine

## 2021-08-07 ENCOUNTER — Inpatient Hospital Stay (HOSPITAL_COMMUNITY): Payer: Medicare Other

## 2021-08-07 DIAGNOSIS — K8050 Calculus of bile duct without cholangitis or cholecystitis without obstruction: Secondary | ICD-10-CM

## 2021-08-07 HISTORY — PX: PANCREATIC STENT PLACEMENT: SHX5539

## 2021-08-07 HISTORY — PX: REMOVAL OF STONES: SHX5545

## 2021-08-07 HISTORY — PX: SPHINCTEROTOMY: SHX5544

## 2021-08-07 HISTORY — PX: ENDOSCOPIC RETROGRADE CHOLANGIOPANCREATOGRAPHY (ERCP) WITH PROPOFOL: SHX5810

## 2021-08-07 LAB — COMPREHENSIVE METABOLIC PANEL
ALT: 128 U/L — ABNORMAL HIGH (ref 0–44)
AST: 61 U/L — ABNORMAL HIGH (ref 15–41)
Albumin: 3.1 g/dL — ABNORMAL LOW (ref 3.5–5.0)
Alkaline Phosphatase: 79 U/L (ref 38–126)
Anion gap: 6 (ref 5–15)
BUN: 23 mg/dL (ref 8–23)
CO2: 26 mmol/L (ref 22–32)
Calcium: 8.7 mg/dL — ABNORMAL LOW (ref 8.9–10.3)
Chloride: 105 mmol/L (ref 98–111)
Creatinine, Ser: 0.74 mg/dL (ref 0.61–1.24)
GFR, Estimated: >60 mL/min (ref >60)
Glucose, Bld: 101 mg/dL — ABNORMAL HIGH (ref 70–99)
Potassium: 4.2 mmol/L (ref 3.5–5.1)
Sodium: 137 mmol/L (ref 135–145)
Total Bilirubin: 1.9 mg/dL — ABNORMAL HIGH (ref 0.3–1.2)
Total Protein: 5.8 g/dL — ABNORMAL LOW (ref 6.5–8.1)

## 2021-08-07 LAB — CBC
HCT: 44.1 % (ref 39.0–52.0)
Hemoglobin: 14.3 g/dL (ref 13.0–17.0)
MCH: 33.1 pg (ref 26.0–34.0)
MCHC: 32.4 g/dL (ref 30.0–36.0)
MCV: 102.1 fL — ABNORMAL HIGH (ref 80.0–100.0)
Platelets: 108 10*3/uL — ABNORMAL LOW (ref 150–400)
RBC: 4.32 MIL/uL (ref 4.22–5.81)
RDW: 14.2 % (ref 11.5–15.5)
WBC: 23.6 10*3/uL — ABNORMAL HIGH (ref 4.0–10.5)
nRBC: 0 % (ref 0.0–0.2)

## 2021-08-07 LAB — PROCALCITONIN: Procalcitonin: 0.61 ng/mL

## 2021-08-07 LAB — LIPASE, BLOOD: Lipase: 177 U/L — ABNORMAL HIGH (ref 11–51)

## 2021-08-07 SURGERY — ENDOSCOPIC RETROGRADE CHOLANGIOPANCREATOGRAPHY (ERCP) WITH PROPOFOL
Anesthesia: General

## 2021-08-07 MED ORDER — PHENYLEPHRINE HCL (PRESSORS) 10 MG/ML IV SOLN
INTRAVENOUS | Status: AC
  Filled 2021-08-07: qty 2

## 2021-08-07 MED ORDER — LIDOCAINE 2% (20 MG/ML) 5 ML SYRINGE
INTRAMUSCULAR | Status: DC | PRN
  Administered 2021-08-07: 100 mg via INTRAVENOUS

## 2021-08-07 MED ORDER — DEXAMETHASONE SODIUM PHOSPHATE 10 MG/ML IJ SOLN
INTRAMUSCULAR | Status: DC | PRN
  Administered 2021-08-07: 8 mg via INTRAVENOUS

## 2021-08-07 MED ORDER — FENTANYL CITRATE (PF) 100 MCG/2ML IJ SOLN
INTRAMUSCULAR | Status: DC | PRN
  Administered 2021-08-07 (×2): 50 ug via INTRAVENOUS

## 2021-08-07 MED ORDER — SODIUM CHLORIDE 0.9 % IV SOLN
INTRAVENOUS | Status: DC | PRN
  Administered 2021-08-07: 20 mL

## 2021-08-07 MED ORDER — FENTANYL CITRATE (PF) 100 MCG/2ML IJ SOLN
INTRAMUSCULAR | Status: AC
  Filled 2021-08-07: qty 2

## 2021-08-07 MED ORDER — INDOMETHACIN 50 MG RE SUPP
RECTAL | Status: AC
  Filled 2021-08-07: qty 2

## 2021-08-07 MED ORDER — GLUCAGON HCL RDNA (DIAGNOSTIC) 1 MG IJ SOLR
INTRAMUSCULAR | Status: AC
  Filled 2021-08-07: qty 1

## 2021-08-07 MED ORDER — SODIUM CHLORIDE 0.9 % IV SOLN
1.0000 g | Freq: Once | INTRAVENOUS | Status: AC
  Administered 2021-08-07: 1 g via INTRAVENOUS
  Filled 2021-08-07: qty 1

## 2021-08-07 MED ORDER — CIPROFLOXACIN IN D5W 400 MG/200ML IV SOLN
INTRAVENOUS | Status: AC
  Filled 2021-08-07: qty 200

## 2021-08-07 MED ORDER — SODIUM CHLORIDE 0.9 % IV SOLN: INTRAVENOUS | Status: DC

## 2021-08-07 MED ORDER — PROPOFOL 10 MG/ML IV BOLUS
INTRAVENOUS | Status: DC | PRN
  Administered 2021-08-07: 120 mg via INTRAVENOUS

## 2021-08-07 MED ORDER — ROCURONIUM BROMIDE 10 MG/ML (PF) SYRINGE
PREFILLED_SYRINGE | INTRAVENOUS | Status: DC | PRN
  Administered 2021-08-07: 60 mg via INTRAVENOUS

## 2021-08-07 MED ORDER — INDOMETHACIN 50 MG RE SUPP
RECTAL | Status: DC | PRN
  Administered 2021-08-07: 100 mg via RECTAL

## 2021-08-07 MED ORDER — SODIUM CHLORIDE 0.9 % IV SOLN
1.0000 g | Freq: Three times a day (TID) | INTRAVENOUS | Status: DC
  Administered 2021-08-08 (×2): 1 g via INTRAVENOUS
  Filled 2021-08-07 (×2): qty 1

## 2021-08-07 MED ORDER — CIPROFLOXACIN IN D5W 400 MG/200ML IV SOLN
INTRAVENOUS | Status: DC | PRN
  Administered 2021-08-07: 400 mg via INTRAVENOUS

## 2021-08-07 MED ORDER — SUGAMMADEX SODIUM 200 MG/2ML IV SOLN
INTRAVENOUS | Status: DC | PRN
  Administered 2021-08-07: 200 mg via INTRAVENOUS

## 2021-08-07 MED ORDER — PHENYLEPHRINE HCL-NACL 20-0.9 MG/250ML-% IV SOLN
INTRAVENOUS | Status: DC | PRN
  Administered 2021-08-07: 20 ug/min via INTRAVENOUS

## 2021-08-07 MED ORDER — ONDANSETRON HCL 4 MG/2ML IJ SOLN
INTRAMUSCULAR | Status: DC | PRN
  Administered 2021-08-07: 4 mg via INTRAVENOUS

## 2021-08-07 NOTE — Consult Note (Addendum)
Consult Note  Hunter Ward Jan 19, 1951  CE:[id number].    Requesting MD: Dr. Ronnette Juniper Chief Complaint/Reason for Consult: biliary pancreatitis s/p cholecystectomy with remnant gallbladder HPI:  Patient is a 70 year old male who presented to [facility] 8/27 with abdominal pain and n/v. Patient reported pain in epigastric abdomen. Patient underwent ERCP yesterday. He reports some mild abdominal pain this AM. His main concern is that he has not had a bowel movement in several days.   PMH significant for HTN and deafness. Patient underwent cholecystectomy about 30 years ago and this was done open. Patient history limited - interpreter used but patient had a hard time focusing and per interpreter did not always make sense or answer questions appropriately. He doesn't seem able to fully grasp that he may need revisional surgery to remove remnant gallbladder.   ROS: Review of Systems  Unable to perform ROS: Mental acuity   History reviewed. No pertinent family history.  Past Medical History:  Diagnosis Date   Hypertension     Past Surgical History:  Procedure Laterality Date   CHOLECYSTECTOMY     JOINT REPLACEMENT Bilateral     Social History:  reports that he has never smoked. He has never used smokeless tobacco. He reports that he does not drink alcohol and does not use drugs.  Allergies:  Allergies  Allergen Reactions   Codeine Itching and Nausea And Vomiting    Medications Prior to Admission  Medication Sig Dispense Refill   lisinopril (ZESTRIL) 20 MG tablet Take 20 mg by mouth daily.     NIFEdipine (PROCARDIA-XL/NIFEDICAL-XL) 30 MG 24 hr tablet Take 30 mg by mouth daily.      Blood pressure 133/83, pulse 90, temperature 99.4 F (37.4 C), temperature source Oral, resp. rate 15, height '6\' 1"'$  (1.854 m), weight 73.9 kg, SpO2 96 %. Physical Exam:  General: pleasant, WD, WN male who is laying in bed in NAD HEENT: head is normocephalic, atraumatic.  Sclera are anicteric.   PERRL.  Ears and nose without any masses or lesions.  Mouth is pink and moist Heart: regular, rate, and rhythm.  Normal s1,s2. No obvious murmurs, gallops, or rubs noted.  Palpable radial and pedal pulses bilaterally Lungs: CTAB, no wheezes, rhonchi, or rales noted.  Respiratory effort nonlabored Abd: soft, mild pain in epigastric abdomen, ND, +BS, RUQ surgical scar MS: all 4 extremities are symmetrical with no cyanosis, clubbing, or edema. Skin: warm and dry with no masses, lesions, or rashes Neuro: Cranial nerves 2-12 grossly intact, sensation is normal throughout Psych: A&Ox3 with an appropriate affect.   Results for orders placed or performed during the hospital encounter of 08/05/21 (from the past 48 hour(s))  Comprehensive metabolic panel     Status: Abnormal   Collection Time: 08/05/21  4:32 PM  Result Value Ref Range   Sodium 138 135 - 145 mmol/L   Potassium 4.0 3.5 - 5.1 mmol/L   Chloride 101 98 - 111 mmol/L   CO2 24 22 - 32 mmol/L   Glucose, Bld 147 (H) 70 - 99 mg/dL    Comment: Glucose reference range applies only to samples taken after fasting for at least 8 hours.   BUN 17 8 - 23 mg/dL   Creatinine, Ser 0.99 0.61 - 1.24 mg/dL   Calcium 9.8 8.9 - 10.3 mg/dL   Total Protein 7.0 6.5 - 8.1 g/dL   Albumin 4.1 3.5 - 5.0 g/dL   AST 279 (H) 15 - 41 U/L  ALT 277 (H) 0 - 44 U/L   Alkaline Phosphatase 129 (H) 38 - 126 U/L   Total Bilirubin 3.9 (H) 0.3 - 1.2 mg/dL   GFR, Estimated >60 >60 mL/min    Comment: (NOTE) Calculated using the CKD-EPI Creatinine Equation (2021)    Anion gap 13 5 - 15    Comment: Performed at [facility], [place], [place] [address]  Lipase, blood     Status: Abnormal   Collection Time: 08/05/21  4:32 PM  Result Value Ref Range   Lipase 1,649 (H) 11 - 51 U/L    Comment: RESULTS CONFIRMED BY MANUAL DILUTION Performed at [facility], [address] [place], [place] [address]   CBC with Diff      Status: Abnormal   Collection Time: 08/05/21  4:32 PM  Result Value Ref Range   WBC 13.2 (H) 4.0 - 10.5 K/uL   RBC 4.30 4.22 - 5.81 MIL/uL   Hemoglobin 14.2 13.0 - 17.0 g/dL   HCT 42.0 39.0 - 52.0 %   MCV 97.7 80.0 - 100.0 fL   MCH 33.0 26.0 - 34.0 pg   MCHC 33.8 30.0 - 36.0 g/dL   RDW 13.7 11.5 - 15.5 %   Platelets 153 150 - 400 K/uL   nRBC 0.0 0.0 - 0.2 %   Neutrophils Relative % 93 %   Neutro Abs 12.3 (H) 1.7 - 7.7 K/uL   Lymphocytes Relative 3 %   Lymphs Abs 0.4 (L) 0.7 - 4.0 K/uL   Monocytes Relative 4 %   Monocytes Absolute 0.6 0.1 - 1.0 K/uL   Eosinophils Relative 0 %   Eosinophils Absolute 0.0 0.0 - 0.5 K/uL   Basophils Relative 0 %   Basophils Absolute 0.0 0.0 - 0.1 K/uL   Immature Granulocytes 0 %   Abs Immature Granulocytes 0.04 0.00 - 0.07 K/uL    Comment: Performed at [facility], [place], [place] [address]  Resp Panel by RT-PCR (Flu A&B, Covid) Nasopharyngeal Swab     Status: None   Collection Time: 08/05/21  6:46 PM   Specimen: Nasopharyngeal Swab; Nasopharyngeal(NP) swabs in vial transport medium  Result Value Ref Range   SARS Coronavirus 2 by RT PCR NEGATIVE NEGATIVE    Comment: (NOTE) SARS-CoV-2 target nucleic acids are NOT DETECTED.  The SARS-CoV-2 RNA is generally detectable in upper respiratory specimens during the acute phase of infection. The lowest concentration of SARS-CoV-2 viral copies this assay can detect is 138 copies/mL. A negative result does not preclude SARS-Cov-2 infection and should not be used as the sole basis for treatment or other patient management decisions. A negative result may occur with  improper specimen collection/handling, submission of specimen other than nasopharyngeal swab, presence of viral mutation(s) within the areas targeted by this assay, and inadequate number of viral copies(<138 copies/mL). A negative result must be combined with clinical observations, patient history, and  epidemiological information. The expected result is Negative.  Fact Sheet for Patients:  EntrepreneurPulse.com.au  Fact Sheet for Healthcare Providers:  IncredibleEmployment.be  This test is no t yet approved or cleared by the [place] FDA and  has been authorized for detection and/or diagnosis of SARS-CoV-2 by FDA under an Emergency Use Authorization (EUA). This EUA will remain  in effect (meaning this test can be used) for the duration of the COVID-19 declaration under Section 564(b)(1) of the Act, 21 U.S.C.section 360bbb-3(b)(1), unless the authorization is terminated  or revoked sooner.  Influenza A by PCR NEGATIVE NEGATIVE   Influenza B by PCR NEGATIVE NEGATIVE    Comment: (NOTE) The Xpert Xpress SARS-CoV-2/FLU/RSV plus assay is intended as an aid in the diagnosis of influenza from Nasopharyngeal swab specimens and should not be used as a sole basis for treatment. Nasal washings and aspirates are unacceptable for Xpert Xpress SARS-CoV-2/FLU/RSV testing.  Fact Sheet for Patients: EntrepreneurPulse.com.au  Fact Sheet for Healthcare Providers: IncredibleEmployment.be  This test is not yet approved or cleared by the [place] FDA and has been authorized for detection and/or diagnosis of SARS-CoV-2 by FDA under an Emergency Use Authorization (EUA). This EUA will remain in effect (meaning this test can be used) for the duration of the COVID-19 declaration under Section 564(b)(1) of the Act, 21 U.S.C. section 360bbb-3(b)(1), unless the authorization is terminated or revoked.  Performed at [facility], [address], [place], [place] [address]   Triglycerides     Status: None   Collection Time: 08/05/21 10:30 PM  Result Value Ref Range   Triglycerides 39 <150 mg/dL    Comment: Performed at [facility], [place] [address], [place], [place] [address]   Comprehensive metabolic panel     Status: Abnormal   Collection Time: 08/06/21  4:59 AM  Result Value Ref Range   Sodium 137 135 - 145 mmol/L   Potassium 4.2 3.5 - 5.1 mmol/L   Chloride 104 98 - 111 mmol/L   CO2 25 22 - 32 mmol/L   Glucose, Bld 129 (H) 70 - 99 mg/dL    Comment: Glucose reference range applies only to samples taken after fasting for at least 8 hours.   BUN 22 8 - 23 mg/dL   Creatinine, Ser 0.86 0.61 - 1.24 mg/dL   Calcium 9.1 8.9 - 10.3 mg/dL   Total Protein 6.6 6.5 - 8.1 g/dL   Albumin 3.9 3.5 - 5.0 g/dL   AST 220 (H) 15 - 41 U/L   ALT 239 (H) 0 - 44 U/L   Alkaline Phosphatase 123 38 - 126 U/L   Total Bilirubin 3.6 (H) 0.3 - 1.2 mg/dL   GFR, Estimated >60 >60 mL/min    Comment: (NOTE) Calculated using the CKD-EPI Creatinine Equation (2021)    Anion gap 8 5 - 15    Comment: Performed at [facility], [place] [address], [place], [place] [address]  CBC     Status: Abnormal   Collection Time: 08/06/21  4:59 AM  Result Value Ref Range   WBC 19.8 (H) 4.0 - 10.5 K/uL   RBC 4.48 4.22 - 5.81 MIL/uL   Hemoglobin 14.9 13.0 - 17.0 g/dL   HCT 43.9 39.0 - 52.0 %   MCV 98.0 80.0 - 100.0 fL   MCH 33.3 26.0 - 34.0 pg   MCHC 33.9 30.0 - 36.0 g/dL   RDW 13.7 11.5 - 15.5 %   Platelets 144 (L) 150 - 400 K/uL   nRBC 0.0 0.0 - 0.2 %    Comment: Performed at [facility], [place] [address] [place], [place] [address]  Lipase, blood     Status: Abnormal   Collection Time: 08/06/21  4:59 AM  Result Value Ref Range   Lipase 771 (H) 11 - 51 U/L    Comment: RESULTS CONFIRMED BY MANUAL DILUTION Performed at [facility], [place] [address], [place], [place] [address]   Comprehensive metabolic panel     Status: Abnormal   Collection Time: 08/07/21  4:20 AM  Result Value Ref Range   Sodium 137 135 - 145 mmol/L   Potassium 4.2 3.5 - 5.1 mmol/L   Chloride 105 98 - 111 mmol/L   CO2 26 22 - 32 mmol/L   Glucose, Bld 101 (H) 70 - 99  mg/dL    Comment: Glucose reference range applies only to samples taken after fasting for at least 8 hours.   BUN 23 8 - 23 mg/dL   Creatinine, Ser 0.74 0.61 - 1.24 mg/dL   Calcium 8.7 (L) 8.9 - 10.3 mg/dL   Total Protein 5.8 (L) 6.5 - 8.1 g/dL   Albumin 3.1 (L) 3.5 - 5.0 g/dL   AST 61 (H) 15 - 41 U/L   ALT 128 (H) 0 - 44 U/L   Alkaline Phosphatase 79 38 - 126 U/L   Total Bilirubin 1.9 (H) 0.3 - 1.2 mg/dL   GFR, Estimated >60 >60 mL/min    Comment: (NOTE) Calculated using the CKD-EPI Creatinine Equation (2021)    Anion gap 6 5 - 15    Comment: Performed at [facility], [place] [address] [place], [place] [address]  Lipase, blood     Status: Abnormal   Collection Time: 08/07/21  4:20 AM  Result Value Ref Range   Lipase 177 (H) 11 - 51 U/L    Comment: Performed at [facility], [place] [address], [place], [place] [address]  CBC     Status: Abnormal   Collection Time: 08/07/21  8:51 AM  Result Value Ref Range   WBC 23.6 (H) 4.0 - 10.5 K/uL   RBC 4.32 4.22 - 5.81 MIL/uL   Hemoglobin 14.3 13.0 - 17.0 g/dL   HCT 44.1 39.0 - 52.0 %   MCV 102.1 (H) 80.0 - 100.0 fL   MCH 33.1 26.0 - 34.0 pg   MCHC 32.4 30.0 - 36.0 g/dL   RDW 14.2 11.5 - 15.5 %   Platelets 108 (L) 150 - 400 K/uL    Comment: SPECIMEN CHECKED FOR CLOTS Immature Platelet Fraction may be clinically indicated, consider ordering this additional test [id number] REPEATED TO VERIFY PLATELET COUNT CONFIRMED BY SMEAR    nRBC 0.0 0.0 - 0.2 %    Comment: Performed at [facility], [place] [address], [place], [place] [address]  Procalcitonin - Baseline     Status: None   Collection Time: 08/07/21  8:51 AM  Result Value Ref Range   Procalcitonin 0.61 ng/mL    Comment:        Interpretation: PCT > 0.5 ng/mL and <= 2 ng/mL: Systemic infection (sepsis) is possible, but other conditions are known to elevate PCT as well. (NOTE)       Sepsis PCT Algorithm           Lower  Respiratory Tract                                      Infection PCT Algorithm    ----------------------------     ----------------------------         PCT < 0.25 ng/mL                PCT < 0.10 ng/mL          Strongly encourage             Strongly discourage   discontinuation of antibiotics    initiation of antibiotics    ----------------------------     -----------------------------  PCT 0.25 - 0.50 ng/mL            PCT 0.10 - 0.25 ng/mL               OR       >80% decrease in PCT            Discourage initiation of                                            antibiotics      Encourage discontinuation           of antibiotics    ----------------------------     -----------------------------         PCT >= 0.50 ng/mL              PCT 0.26 - 0.50 ng/mL                AND       <80% decrease in PCT             Encourage initiation of                                             antibiotics       Encourage continuation           of antibiotics    ----------------------------     -----------------------------        PCT >= 0.50 ng/mL                  PCT > 0.50 ng/mL               AND         increase in PCT                  Strongly encourage                                      initiation of antibiotics    Strongly encourage escalation           of antibiotics                                     -----------------------------                                           PCT <= 0.25 ng/mL                                                 OR                                        > 80% decrease in PCT  Discontinue / Do not initiate                                             antibiotics  Performed at [facility] [address] [place], [place] [address]    CT ABDOMEN PELVIS W CONTRAST  Result Date: 08/05/2021 CLINICAL DATA:  Persistent vomiting with history of prior cholecystectomy. EXAM: CT ABDOMEN AND PELVIS WITH  CONTRAST TECHNIQUE: Multidetector CT imaging of the abdomen and pelvis was performed using the standard protocol following bolus administration of intravenous contrast. CONTRAST:  70m OMNIPAQUE IOHEXOL 350 MG/ML SOLN COMPARISON:  None. FINDINGS: Lower chest: No acute abnormality. Hepatobiliary: No focal liver abnormality is seen. Status post cholecystectomy. 9 mm and 14 mm calcifications are seen within the inferior aspect of the gallbladder fossa. The common bile duct measures approximately 9 mm. Pancreas: Marked severity peripancreatic inflammatory fat stranding and peripancreatic fluid is seen. This extends to involve the left upper quadrant and mid and lower portions of the right abdomen. Diffuse enlargement of the pancreatic head and uncinate process is also noted (approximately 3.6 cm x 3.6 cm (axial CT image 35, CT series 2). Spleen: Normal in size without focal abnormality. Adrenals/Urinary Tract: A 2.4 cm x 1.9 cm well-defined low-attenuation (approximately 15.91 Hounsfield units) left adrenal mass is noted. The right adrenal gland is normal in size and appearance. Kidneys are normal, without renal calculi, focal lesion, or hydronephrosis. Bladder is unremarkable. Stomach/Bowel: There is a small hiatal hernia. Appendix appears normal. No evidence of bowel dilatation. Vascular/Lymphatic: Aortic atherosclerosis. No enlarged abdominal or pelvic lymph nodes. Reproductive: There is mild to moderate severity prostate gland enlargement. Other: No abdominal wall hernia or abnormality. No abdominopelvic ascites. Musculoskeletal: Multilevel degenerative changes are seen throughout the lumbar spine. IMPRESSION: 1. Marked severity acute pancreatitis with associated inflammatory changes. 2. Small retained gallstones within the gallbladder fossa, status post cholecystectomy. 3. Left adrenal mass which may represent an adrenal adenoma. 4. Small hiatal hernia. 5. Mild to moderate severity prostate gland enlargement. 6.  Aortic atherosclerosis. Aortic Atherosclerosis (ICD10-I70.0). Electronically Signed   By: TVirgina NorfolkM.D.   On: 08/05/2021 18:18   MR 3D Recon At Scanner  Result Date: 08/06/2021 CLINICAL DATA:  Pancreatitis, jaundice, nausea, vomiting, history of cholecystectomy EXAM: MRI ABDOMEN WITHOUT AND WITH CONTRAST (INCLUDING MRCP) TECHNIQUE: Multiplanar multisequence MR imaging of the abdomen was performed both before and after the administration of intravenous contrast. Heavily T2-weighted images of the biliary and pancreatic ducts were obtained, and three-dimensional MRCP images were rendered by post processing. CONTRAST:  7.537mGADAVIST GADOBUTROL 1 MMOL/ML IV SOLN COMPARISON:  CT abdomen pelvis, 08/05/2021 FINDINGS: Lower chest: No acute findings. Hepatobiliary: No mass or other parenchymal abnormality identified. Status post cholecystectomy with a sizable gallbladder remnant in the gallbladder fossa containing multiple gallstones. There are two gallstones within the common bile duct in the pancreatic head, approximately 4 and 5 cm from the ampulla, measuring 5-6 mm each (series 6, image 15). There is severe intra and extrahepatic biliary ductal dilatation, the common bile duct measuring up to 1.5 cm in caliber. Pancreas: Very extensive, diffuse edema of the pancreatic parenchyma with adjacent peripancreatic fat stranding and fluid (series 3, image 20). There are areas of parenchymal hypoenhancement, particularly of the pancreatic head and neck (series 29, image 53, 37). No pancreatic ductal dilatation. No acute pancreatic fluid collection. Spleen:  Within  normal limits in size and appearance. Adrenals/Urinary Tract: There is a definitively benign, fat containing left adrenal adenoma (series 9, image 27). No evidence of hydronephrosis. Stomach/Bowel: Visualized portions within the abdomen are unremarkable. Vascular/Lymphatic: No pathologically enlarged lymph nodes identified. No abdominal aortic aneurysm  demonstrated. Other:  Small volume ascites. Musculoskeletal: No suspicious bone lesions identified. IMPRESSION: 1. Very extensive, diffuse edema of the pancreatic parenchyma with adjacent peripancreatic fat stranding and fluid. There are areas of parenchymal hypoenhancement, particularly of the pancreatic head and neck, concerning for necrosis in the setting of severe acute pancreatitis. No evidence of acute pancreatic fluid collection. 2. Severe intra and extrahepatic biliary ductal dilatation, the common bile duct measuring up to 1.5 cm in caliber. Choledocholithiasis with two gallstones within the common bile duct in the pancreatic head measuring 5-6 mm each, approximately 4 and 5 cm from the ampulla. 3. Status post cholecystectomy with a sizable gallbladder remnant in the gallbladder fossa containing multiple gallstones. 4. Small volume ascites. These results will be called to the ordering clinician or representative by the Radiologist Assistant, and communication documented in the PACS or [employer]. Electronically Signed   By: Eddie Candle M.D.   On: 08/06/2021 20:19   MR ABDOMEN MRCP W WO CONTAST  Result Date: 08/06/2021 CLINICAL DATA:  Pancreatitis, jaundice, nausea, vomiting, history of cholecystectomy EXAM: MRI ABDOMEN WITHOUT AND WITH CONTRAST (INCLUDING MRCP) TECHNIQUE: Multiplanar multisequence MR imaging of the abdomen was performed both before and after the administration of intravenous contrast. Heavily T2-weighted images of the biliary and pancreatic ducts were obtained, and three-dimensional MRCP images were rendered by post processing. CONTRAST:  7.40m GADAVIST GADOBUTROL 1 MMOL/ML IV SOLN COMPARISON:  CT abdomen pelvis, 08/05/2021 FINDINGS: Lower chest: No acute findings. Hepatobiliary: No mass or other parenchymal abnormality identified. Status post cholecystectomy with a sizable gallbladder remnant in the gallbladder fossa containing multiple gallstones. There are two gallstones  within the common bile duct in the pancreatic head, approximately 4 and 5 cm from the ampulla, measuring 5-6 mm each (series 6, image 15). There is severe intra and extrahepatic biliary ductal dilatation, the common bile duct measuring up to 1.5 cm in caliber. Pancreas: Very extensive, diffuse edema of the pancreatic parenchyma with adjacent peripancreatic fat stranding and fluid (series 3, image 20). There are areas of parenchymal hypoenhancement, particularly of the pancreatic head and neck (series 29, image 53, 37). No pancreatic ductal dilatation. No acute pancreatic fluid collection. Spleen:  Within normal limits in size and appearance. Adrenals/Urinary Tract: There is a definitively benign, fat containing left adrenal adenoma (series 9, image 27). No evidence of hydronephrosis. Stomach/Bowel: Visualized portions within the abdomen are unremarkable. Vascular/Lymphatic: No pathologically enlarged lymph nodes identified. No abdominal aortic aneurysm demonstrated. Other:  Small volume ascites. Musculoskeletal: No suspicious bone lesions identified. IMPRESSION: 1. Very extensive, diffuse edema of the pancreatic parenchyma with adjacent peripancreatic fat stranding and fluid. There are areas of parenchymal hypoenhancement, particularly of the pancreatic head and neck, concerning for necrosis in the setting of severe acute pancreatitis. No evidence of acute pancreatic fluid collection. 2. Severe intra and extrahepatic biliary ductal dilatation, the common bile duct measuring up to 1.5 cm in caliber. Choledocholithiasis with two gallstones within the common bile duct in the pancreatic head measuring 5-6 mm each, approximately 4 and 5 cm from the ampulla. 3. Status post cholecystectomy with a sizable gallbladder remnant in the gallbladder fossa containing multiple gallstones. 4. Small volume ascites. These results will be called to the ordering clinician or representative  by the Radiologist Assistant, and communication  documented in the PACS or [employer]. Electronically Signed   By: Eddie Candle M.D.   On: 08/06/2021 20:19   US Abdomen Limited RUQ (LIVER/GB)  Result Date: 08/06/2021 CLINICAL DATA:  Elevated serum total bilirubin levels. Prior cholecystectomy. EXAM: ULTRASOUND ABDOMEN LIMITED RIGHT UPPER QUADRANT COMPARISON:  January 17, 2021 FINDINGS: Gallbladder: The gallbladder is surgically absent. A 1.2 cm shadowing echogenic focus is noted within the gallbladder fossa. Common bile duct: Diameter: 15.1 mm Liver: No focal lesion identified. Within normal limits in parenchymal echogenicity. Portal vein is patent on color Doppler imaging with normal direction of blood flow towards the liver. Other: None. IMPRESSION: 1. Evidence of prior cholecystectomy with a retained gallstones suspected within the gallbladder fossa. Electronically Signed   By: Virgina Norfolk M.D.   On: 08/06/2021 00:56      Assessment/Plan Biliary pancreatitis S/P open cholecystectomy 30 years ago with remnant gallbladder - lipase >1600 on admit and was downtrending - 177 8/29 - Tbili 3.9 on admit and 1.9 8/29 - US/CT showed retained gallstones in gallbladder fossa but no mention of remnant gallbladder - MRCP showed sizable gallbladder remnant containing multiple gallstones - s/p ERCP 8/29 - if it is felt that patient needs surgical intervention for resection of remnant gallbladder acutely then would recommend transfer to tertiary center. If it is felt that patient could follow up as an outpatient to discuss elective resection, although I am not sure that patient would be compliant with this given our discussion today, then could follow up with HPB surgery as an outpatient.   FEN: soft diet, IVF per primary  VTE: LMWH ID: merrem  HTN Deafness   Norm Parcel, [facility] Surgery 08/07/2021, 3:33 PM Please see Amion for pager number during day hours 7:00am-4:30pm

## 2021-08-07 NOTE — Progress Notes (Signed)
Consulted concerning remnant gallbladder with cholelithiasis and biliary pancreatitis. Patient underwent cholecystectomy 30 years ago. Patient is undergoing ERCP today. We will see and write a formal consult tomorrow AM.   Norm Parcel, [facility] Surgery 08/07/2021, 3:20 PM Please see Amion for pager number during day hours 7:00am-4:30pm

## 2021-08-07 NOTE — Transfer of Care (Signed)
Immediate Anesthesia Transfer of Care Note  Patient: JOHNATHON DOBBYN  Procedure(s) Performed: ENDOSCOPIC RETROGRADE CHOLANGIOPANCREATOGRAPHY (ERCP) WITH PROPOFOL PANCREATIC STENT PLACEMENT SPHINCTEROTOMY REMOVAL OF STONES  Patient Location: PACU  Anesthesia Type:General  Level of Consciousness: awake, alert , oriented and patient cooperative  Airway & Oxygen Therapy: Patient Spontanous Breathing and Patient connected to face mask oxygen  Post-op Assessment: Report given to RN and Post -op Vital signs reviewed and stable  Post vital signs: Reviewed and stable  Last Vitals:  Vitals Value Taken Time  BP    Temp    Pulse    Resp    SpO2      Last Pain:  Vitals:   08/07/21 1406  TempSrc: Oral  PainSc:          Complications: No notable events documented.

## 2021-08-07 NOTE — Progress Notes (Addendum)
[facility] Gastroenterology Progress Note  Hunter Ward 70 y.o. 10-Aug-1951  CC:  Gallstone pancreatitis   Subjective: Patient reports continued upper abdominal pain and nausea.  Encounter conducted with [employer] video interpreting services (ASL), Dani [phone].  ROS : Review of Systems  Cardiovascular:  Negative for chest pain and palpitations.  Gastrointestinal:  Positive for abdominal pain and nausea. Negative for blood in stool, constipation, diarrhea, heartburn, melena and vomiting.     Objective: Vital signs in last 24 hours: Vitals:   08/06/21 2056 08/07/21 0545  BP: 123/90 124/71  Pulse: 93 81  Resp: 16 16  Temp: 99.7 F (37.6 C) 98.9 F (37.2 C)  SpO2: 97% 97%    Physical Exam:  General:  Lethargic, oriented, cooperative, no distress  Head:  Normocephalic, without obvious abnormality, atraumatic  Eyes:  Mildly icteric sclera, EOMs intact  Lungs:   Respirations unlabored  Heart:  Regular rate and rhythm, S1, S2 normal  Abdomen:   Soft, with moderate epigastric tenderness to palpation, normoactive bowel sounds   Extremities: Extremities normal, atraumatic, no  edema    Lab Results: Recent Labs    08/06/21 0459 08/07/21 0420  NA 137 137  K 4.2 4.2  CL 104 105  CO2 25 26  GLUCOSE 129* 101*  BUN 22 23  CREATININE 0.86 0.74  CALCIUM 9.1 8.7*   Recent Labs    08/06/21 0459 08/07/21 0420  AST 220* 61*  ALT 239* 128*  ALKPHOS 123 79  BILITOT 3.6* 1.9*  PROT 6.6 5.8*  ALBUMIN 3.9 3.1*   Recent Labs    08/05/21 1632 08/06/21 0459  WBC 13.2* 19.8*  NEUTROABS 12.3*  --   HGB 14.2 14.9  HCT 42.0 43.9  MCV 97.7 98.0  PLT 153 144*   No results for input(s): LABPROT, INR in the last 72 hours.   Assessment: Gallstone pancreatitis: Severe acute pancreatitis and choledocholithiasis with severe intra and extra hepatic biliary dilation on MRI/MRCP 8/28 -s/p cholecystectomy with a sizable gallbladder remnant in the gallbladder fossa containing multiple  gallstones. -T. Bili 1.9, improved from 3.6 yesterday -AST 61/ ALT 128/ ALP 79, down-trending -Lipase 177, compared to 771 yesterday -CBC pending -Normal renal function: BUN 23/ Cr 0.74  Plan: Proceed with ERCP today with Dr. Therisa Doyne.  I thoroughly discussed the procedure with the patient via [employer] video interpreting services (ASL), Dani [phone], to include nature, alternatives, benefits, and risks (including but not limited to pancreatitis/worsening pancreatitis, bleeding, infection, perforation, anesthesia/cardiac and pulmonary complications).  Patient verbalized understanding and gave verbal consent to proceed with ERCP.  Recommend surgical consultation for gallbladder remnant; however, discussed with Claiborne Billings (PA, [facility]) who states she discussed with her attending physician and stated that proceeding with removal of gallbladder remnant is likely not feasible due to anatomy.  Thus, recommend outpatient referral to [facility] (Dr. Gayleen Orem) for consideration of endoscopic drainage of GB remnant (EUS-guided transduodenal drainage vs. ERCP with transpapillary cystic duct stent placement) to prevent recurrent choledocholithiasis.  NPO until post-procedure.  Continue to trend LFTs and CBC.  Continue supportive care.  [facility] will follow.  Salley Slaughter PA-C 08/07/2021, 9:09 AM  Contact #  [phone]

## 2021-08-07 NOTE — Anesthesia Postprocedure Evaluation (Signed)
Anesthesia Post Note  Patient: Hunter Ward  Procedure(s) Performed: ENDOSCOPIC RETROGRADE CHOLANGIOPANCREATOGRAPHY (ERCP) WITH PROPOFOL PANCREATIC STENT PLACEMENT SPHINCTEROTOMY REMOVAL OF STONES     Patient location during evaluation: PACU Anesthesia Type: General Level of consciousness: awake and alert Pain management: pain level controlled Vital Signs Assessment: post-procedure vital signs reviewed and stable Respiratory status: spontaneous breathing, nonlabored ventilation, respiratory function stable and patient connected to nasal cannula oxygen Cardiovascular status: blood pressure returned to baseline and stable Postop Assessment: no apparent nausea or vomiting Anesthetic complications: no   No notable events documented.  Last Vitals:  Vitals:   08/07/21 1406 08/07/21 1411  BP: (!) 141/79 133/83  Pulse: 90   Resp: 15   Temp: 37.4 C   SpO2: 96%     Last Pain:  Vitals:   08/07/21 1406  TempSrc: Oral  PainSc:                  Bobby Ragan S

## 2021-08-07 NOTE — Anesthesia Preprocedure Evaluation (Signed)
Anesthesia Evaluation  Patient identified by MRN, date of birth, ID band Patient awake    Reviewed: Allergy & Precautions, NPO status , Patient's Chart, lab work & pertinent test results  Airway Mallampati: II  TM Distance: >3 FB Neck ROM: Full    Dental no notable dental hx.    Pulmonary neg pulmonary ROS,    Pulmonary exam normal breath sounds clear to auscultation       Cardiovascular hypertension, Normal cardiovascular exam Rhythm:Regular Rate:Normal     Neuro/Psych negative neurological ROS  negative psych ROS   GI/Hepatic negative GI ROS, Acute pancreatitis   Endo/Other  negative endocrine ROS  Renal/GU negative Renal ROS  negative genitourinary   Musculoskeletal negative musculoskeletal ROS (+)   Abdominal   Peds negative pediatric ROS (+)  Hematology negative hematology ROS (+)   Anesthesia Other Findings   Reproductive/Obstetrics negative OB ROS                             Anesthesia Physical Anesthesia Plan  ASA: 2  Anesthesia Plan: General   Post-op Pain Management:    Induction: Intravenous  PONV Risk Score and Plan: 2 and Ondansetron, Dexamethasone and Treatment may vary due to age or medical condition  Airway Management Planned: Oral ETT  Additional Equipment:   Intra-op Plan:   Post-operative Plan: Extubation in OR  Informed Consent: I have reviewed the patients History and Physical, chart, labs and discussed the procedure including the risks, benefits and alternatives for the proposed anesthesia with the patient or authorized representative who has indicated his/her understanding and acceptance.     Dental advisory given  Plan Discussed with: CRNA and Surgeon  Anesthesia Plan Comments:         Anesthesia Quick Evaluation

## 2021-08-07 NOTE — Progress Notes (Signed)
         [facility]                                                                              Patient Demographics  Fontaine Earlywine, is a 70 y.o. male, DOB - 09/10/1951, MRN:[id number]  Admit date - 08/05/2021   Admitting Physician Ching T Tu, DO  Outpatient Primary MD for the patient is Ehinger, Robert, MD  Outpatient specialists:   LOS - 2  days   Medical records reviewed and are as summarized below:    Chief Complaint  Patient presents with   Abdominal Pain   Emesis       Brief summary   70-year-old male with hypertension, prior cholecystectomy presented with nausea vomiting and abdominal pain. + Constipation In ED, afebrile, normotensive, lipase > 1600 CT abdomen showed acute pancreatitis with retained stone in gallbladder fossa, CBD 9 mm. Elevated LFTs AST 279, ALT 277, alk phos 129, bilirubin 3.9.  No alcohol use   Assessment & Plan    Principal Problem:   Acute gallstone pancreatitis with transaminitis -CT abdomen showed acute pancreatitis with retained stone in gallbladder fossa, CBD 9 mm, presented with nausea, vomiting, abdominal pain with elevated LFTs. -Triglycerides 39, no history of alcohol use -MRCP reviewed, areas concerning for necrosis in the pancreatic head and neck concerning for in the setting of severe acute pancreatitis.  Severe intra and extrahepatic biliary ductal dilatation, choledocholithiasis. -GI following, plan for ERCP today -Continue n.p.o. status, aggressive IV fluid hydration, pain control, antiemetics -Procalcitonin 0.6, leukocytosis trending up, 23.6, worsening pain, transaminitis, -obtain blood cultures, placed on meropenem (high risk of mortality with acute necrotizing pancreatitis)  Active Problems:   HTN (hypertension) -BP controlled  Constipation -We will place on bowel regimen once tolerating diet    Code Status: Full CODE STATUS DVT Prophylaxis:  enoxaparin (LOVENOX) injection 40 mg Start: 08/05/21  2230   Level of Care: Level of care: Med-Surg Family Communication: Discussed all imaging results, lab results, explained to the patient   Patient was examined and all questions/concerns were addressed with the assistance of ASL interpreter, Sarah #[phone]   Disposition Plan:     Status is: Inpatient  Remains inpatient appropriate because:Inpatient level of care appropriate due to severity of illness  Dispo: The patient is from: Home              Anticipated d/c is to: Home              Patient currently is not medically stable to d/c.   Difficult to place patient No      Time Spent in minutes   25 minutes  Procedures:  MRCP  Consultants:   Gastroenterology  Antimicrobials:   Anti-infectives (From admission, onward)    None          Medications  Scheduled Meds:  [MAR Hold] bisacodyl  10 mg Rectal Once   [MAR Hold] enoxaparin (LOVENOX) injection  40 mg Subcutaneous Q24H   [MAR Hold] ondansetron (ZOFRAN) IV  4 mg Intravenous Once   Continuous Infusions:  sodium chloride     lactated ringers 150 mL/hr at 08/07/21 1452   PRN Meds:.[MAR Hold]    HYDROmorphone (DILAUDID) injection, indomethacin, [MAR Hold] ondansetron (ZOFRAN) IV      Subjective:   Tarique Loveall was seen and examined today with assistance of ASL interpreter.  Complaining of significant right upper quadrant pain, 9/10, nausea.  Low-grade temp of 99.4.  No chest pain or shortness of breath. Objective:   Vitals:   08/07/21 0545 08/07/21 1314 08/07/21 1406 08/07/21 1411  BP: 124/71 (!) 136/100 (!) 141/79 133/83  Pulse: 81 81 90   Resp: _0 Temp: 98.9 F (37.2 C) 99.3 F (37.4 C) 99.4 F (37.4 C)   TempSrc: Oral Oral Oral   SpO2: 97% 98% 96%   Weight:   73.9 kg   Height:   6' 1" (1.854 m)     Intake/Output Summary (Last 24 hours) at 08/07/2021 1512 Last data filed at 08/07/2021 1400 Gross per 24 hour  Intake 3235.28 ml  Output 525 ml  Net 2710.28 ml     Wt Readings from  Last 3 Encounters:  08/07/21 73.9 kg   Physical Exam General: Alert and oriented x 3, feels uncomfortable with pain Cardiovascular: S1 S2 clear, RRR. No pedal edema b/l Respiratory: CTAB, no wheezing, rales or rhonchi Gastrointestinal: Soft, right upper quadrant TTP, nondistended, NBS Ext: no pedal edema bilaterally Psych: normal affect   Data Reviewed:  I have personally reviewed following labs and imaging studies  Micro Results Recent Results (from the past 240 hour(s))  Resp Panel by RT-PCR (Flu A&B, Covid) Nasopharyngeal Swab     Status: None   Collection Time: 08/05/21  6:46 PM   Specimen: Nasopharyngeal Swab; Nasopharyngeal(NP) swabs in vial transport medium  Result Value Ref Range Status   SARS Coronavirus 2 by RT PCR NEGATIVE NEGATIVE Final    Comment: (NOTE) SARS-CoV-2 target nucleic acids are NOT DETECTED.  The SARS-CoV-2 RNA is generally detectable in upper respiratory specimens during the acute phase of infection. The lowest concentration of SARS-CoV-2 viral copies this assay can detect is 138 copies/mL. A negative result does not preclude SARS-Cov-2 infection and should not be used as the sole basis for treatment or other patient management decisions. A negative result may occur with  improper specimen collection/handling, submission of specimen other than nasopharyngeal swab, presence of viral mutation(s) within the areas targeted by this assay, and inadequate number of viral copies(<138 copies/mL). A negative result must be combined with clinical observations, patient history, and epidemiological information. The expected result is Negative.  Fact Sheet for Patients:  EntrepreneurPulse.com.au  Fact Sheet for Healthcare Providers:  IncredibleEmployment.be  This test is no t yet approved or cleared by the [place] FDA and  has been authorized for detection and/or diagnosis of SARS-CoV-2 by FDA under an Emergency Use  Authorization (EUA). This EUA will remain  in effect (meaning this test can be used) for the duration of the COVID-19 declaration under Section 564(b)(1) of the Act, 21 U.S.C.section 360bbb-3(b)(1), unless the authorization is terminated  or revoked sooner.       Influenza A by PCR NEGATIVE NEGATIVE Final   Influenza B by PCR NEGATIVE NEGATIVE Final    Comment: (NOTE) The Xpert Xpress SARS-CoV-2/FLU/RSV plus assay is intended as an aid in the diagnosis of influenza from Nasopharyngeal swab specimens and should not be used as a sole basis for treatment. Nasal washings and aspirates are unacceptable for Xpert Xpress SARS-CoV-2/FLU/RSV testing.  Fact Sheet for Patients: EntrepreneurPulse.com.au  Fact Sheet for Healthcare Providers: IncredibleEmployment.be  This test is not yet approved or cleared by  the [employer] and has been authorized for detection and/or diagnosis of SARS-CoV-2 by FDA under an Emergency Use Authorization (EUA). This EUA will remain in effect (meaning this test can be used) for the duration of the COVID-19 declaration under Section 564(b)(1) of the Act, 21 U.S.C. section 360bbb-3(b)(1), unless the authorization is terminated or revoked.  Performed at [facility], [address] [place], [place] [address]     Radiology Reports CT ABDOMEN PELVIS W CONTRAST  Result Date: 08/05/2021 CLINICAL DATA:  Persistent vomiting with history of prior cholecystectomy. EXAM: CT ABDOMEN AND PELVIS WITH CONTRAST TECHNIQUE: Multidetector CT imaging of the abdomen and pelvis was performed using the standard protocol following bolus administration of intravenous contrast. CONTRAST:  36m OMNIPAQUE IOHEXOL 350 MG/ML SOLN COMPARISON:  None. FINDINGS: Lower chest: No acute abnormality. Hepatobiliary: No focal liver abnormality is seen. Status post cholecystectomy. 9 mm and 14 mm calcifications are seen within the inferior  aspect of the gallbladder fossa. The common bile duct measures approximately 9 mm. Pancreas: Marked severity peripancreatic inflammatory fat stranding and peripancreatic fluid is seen. This extends to involve the left upper quadrant and mid and lower portions of the right abdomen. Diffuse enlargement of the pancreatic head and uncinate process is also noted (approximately 3.6 cm x 3.6 cm (axial CT image 35, CT series 2). Spleen: Normal in size without focal abnormality. Adrenals/Urinary Tract: A 2.4 cm x 1.9 cm well-defined low-attenuation (approximately 15.91 Hounsfield units) left adrenal mass is noted. The right adrenal gland is normal in size and appearance. Kidneys are normal, without renal calculi, focal lesion, or hydronephrosis. Bladder is unremarkable. Stomach/Bowel: There is a small hiatal hernia. Appendix appears normal. No evidence of bowel dilatation. Vascular/Lymphatic: Aortic atherosclerosis. No enlarged abdominal or pelvic lymph nodes. Reproductive: There is mild to moderate severity prostate gland enlargement. Other: No abdominal wall hernia or abnormality. No abdominopelvic ascites. Musculoskeletal: Multilevel degenerative changes are seen throughout the lumbar spine. IMPRESSION: 1. Marked severity acute pancreatitis with associated inflammatory changes. 2. Small retained gallstones within the gallbladder fossa, status post cholecystectomy. 3. Left adrenal mass which may represent an adrenal adenoma. 4. Small hiatal hernia. 5. Mild to moderate severity prostate gland enlargement. 6. Aortic atherosclerosis. Aortic Atherosclerosis (ICD10-I70.0). Electronically Signed   By: TVirgina NorfolkM.D.   On: 08/05/2021 18:18   MR 3D Recon At Scanner  Result Date: 08/06/2021 CLINICAL DATA:  Pancreatitis, jaundice, nausea, vomiting, history of cholecystectomy EXAM: MRI ABDOMEN WITHOUT AND WITH CONTRAST (INCLUDING MRCP) TECHNIQUE: Multiplanar multisequence MR imaging of the abdomen was performed both  before and after the administration of intravenous contrast. Heavily T2-weighted images of the biliary and pancreatic ducts were obtained, and three-dimensional MRCP images were rendered by post processing. CONTRAST:  7.556mGADAVIST GADOBUTROL 1 MMOL/ML IV SOLN COMPARISON:  CT abdomen pelvis, 08/05/2021 FINDINGS: Lower chest: No acute findings. Hepatobiliary: No mass or other parenchymal abnormality identified. Status post cholecystectomy with a sizable gallbladder remnant in the gallbladder fossa containing multiple gallstones. There are two gallstones within the common bile duct in the pancreatic head, approximately 4 and 5 cm from the ampulla, measuring 5-6 mm each (series 6, image 15). There is severe intra and extrahepatic biliary ductal dilatation, the common bile duct measuring up to 1.5 cm in caliber. Pancreas: Very extensive, diffuse edema of the pancreatic parenchyma with adjacent peripancreatic fat stranding and fluid (series 3, image 20). There are areas of parenchymal hypoenhancement, particularly of the pancreatic head and neck (series 29, image 53, 37). No pancreatic  ductal dilatation. No acute pancreatic fluid collection. Spleen:  Within normal limits in size and appearance. Adrenals/Urinary Tract: There is a definitively benign, fat containing left adrenal adenoma (series 9, image 27). No evidence of hydronephrosis. Stomach/Bowel: Visualized portions within the abdomen are unremarkable. Vascular/Lymphatic: No pathologically enlarged lymph nodes identified. No abdominal aortic aneurysm demonstrated. Other:  Small volume ascites. Musculoskeletal: No suspicious bone lesions identified. IMPRESSION: 1. Very extensive, diffuse edema of the pancreatic parenchyma with adjacent peripancreatic fat stranding and fluid. There are areas of parenchymal hypoenhancement, particularly of the pancreatic head and neck, concerning for necrosis in the setting of severe acute pancreatitis. No evidence of acute pancreatic  fluid collection. 2. Severe intra and extrahepatic biliary ductal dilatation, the common bile duct measuring up to 1.5 cm in caliber. Choledocholithiasis with two gallstones within the common bile duct in the pancreatic head measuring 5-6 mm each, approximately 4 and 5 cm from the ampulla. 3. Status post cholecystectomy with a sizable gallbladder remnant in the gallbladder fossa containing multiple gallstones. 4. Small volume ascites. These results will be called to the ordering clinician or representative by the Radiologist Assistant, and communication documented in the PACS or [facility] Dashboard. Electronically Signed   By: Alex  Bibbey M.D.   On: 08/06/2021 20:19   MR ABDOMEN MRCP W WO CONTAST  Result Date: 08/06/2021 CLINICAL DATA:  Pancreatitis, jaundice, nausea, vomiting, history of cholecystectomy EXAM: MRI ABDOMEN WITHOUT AND WITH CONTRAST (INCLUDING MRCP) TECHNIQUE: Multiplanar multisequence MR imaging of the abdomen was performed both before and after the administration of intravenous contrast. Heavily T2-weighted images of the biliary and pancreatic ducts were obtained, and three-dimensional MRCP images were rendered by post processing. CONTRAST:  7.5mL GADAVIST GADOBUTROL 1 MMOL/ML IV SOLN COMPARISON:  CT abdomen pelvis, 08/05/2021 FINDINGS: Lower chest: No acute findings. Hepatobiliary: No mass or other parenchymal abnormality identified. Status post cholecystectomy with a sizable gallbladder remnant in the gallbladder fossa containing multiple gallstones. There are two gallstones within the common bile duct in the pancreatic head, approximately 4 and 5 cm from the ampulla, measuring 5-6 mm each (series 6, image 15). There is severe intra and extrahepatic biliary ductal dilatation, the common bile duct measuring up to 1.5 cm in caliber. Pancreas: Very extensive, diffuse edema of the pancreatic parenchyma with adjacent peripancreatic fat stranding and fluid (series 3, image 20). There are areas of  parenchymal hypoenhancement, particularly of the pancreatic head and neck (series 29, image 53, 37). No pancreatic ductal dilatation. No acute pancreatic fluid collection. Spleen:  Within normal limits in size and appearance. Adrenals/Urinary Tract: There is a definitively benign, fat containing left adrenal adenoma (series 9, image 27). No evidence of hydronephrosis. Stomach/Bowel: Visualized portions within the abdomen are unremarkable. Vascular/Lymphatic: No pathologically enlarged lymph nodes identified. No abdominal aortic aneurysm demonstrated. Other:  Small volume ascites. Musculoskeletal: No suspicious bone lesions identified. IMPRESSION: 1. Very extensive, diffuse edema of the pancreatic parenchyma with adjacent peripancreatic fat stranding and fluid. There are areas of parenchymal hypoenhancement, particularly of the pancreatic head and neck, concerning for necrosis in the setting of severe acute pancreatitis. No evidence of acute pancreatic fluid collection. 2. Severe intra and extrahepatic biliary ductal dilatation, the common bile duct measuring up to 1.5 cm in caliber. Choledocholithiasis with two gallstones within the common bile duct in the pancreatic head measuring 5-6 mm each, approximately 4 and 5 cm from the ampulla. 3. Status post cholecystectomy with a sizable gallbladder remnant in the gallbladder fossa containing multiple gallstones. 4. Small volume ascites. These   results will be called to the ordering clinician or representative by the Radiologist Assistant, and communication documented in the PACS or [employer]. Electronically Signed   By: Eddie Candle M.D.   On: 08/06/2021 20:19   US Abdomen Limited RUQ (LIVER/GB)  Result Date: 08/06/2021 CLINICAL DATA:  Elevated serum total bilirubin levels. Prior cholecystectomy. EXAM: ULTRASOUND ABDOMEN LIMITED RIGHT UPPER QUADRANT COMPARISON:  January 17, 2021 FINDINGS: Gallbladder: The gallbladder is surgically absent. A 1.2 cm shadowing  echogenic focus is noted within the gallbladder fossa. Common bile duct: Diameter: 15.1 mm Liver: No focal lesion identified. Within normal limits in parenchymal echogenicity. Portal vein is patent on color Doppler imaging with normal direction of blood flow towards the liver. Other: None. IMPRESSION: 1. Evidence of prior cholecystectomy with a retained gallstones suspected within the gallbladder fossa. Electronically Signed   By: Virgina Norfolk M.D.   On: 08/06/2021 00:56    Lab Data:  CBC: Recent Labs  Lab 08/05/21 1632 08/06/21 0459 08/07/21 0851  WBC 13.2* 19.8* 23.6*  NEUTROABS 12.3*  --   --   HGB 14.2 14.9 14.3  HCT 42.0 43.9 44.1  MCV 97.7 98.0 102.1*  PLT 153 144* 250*   Basic Metabolic Panel: Recent Labs  Lab 08/05/21 1632 08/06/21 0459 08/07/21 0420  NA 138 137 137  K 4.0 4.2 4.2  CL 101 104 105  CO2 _0 GLUCOSE 147* 129* 101*  BUN _1 CREATININE 0.99 0.86 0.74  CALCIUM 9.8 9.1 8.7*   GFR: Estimated Creatinine Clearance: 88.5 mL/min (by C-G formula based on SCr of 0.74 mg/dL). Liver Function Tests: Recent Labs  Lab 08/05/21 1632 08/06/21 0459 08/07/21 0420  AST 279* 220* 61*  ALT 277* 239* 128*  ALKPHOS 129* 123 79  BILITOT 3.9* 3.6* 1.9*  PROT 7.0 6.6 5.8*  ALBUMIN 4.1 3.9 3.1*   Recent Labs  Lab 08/05/21 1632 08/06/21 0459 08/07/21 0420  LIPASE 1,649* 771* 177*   No results for input(s): AMMONIA in the last 168 hours. Coagulation Profile: No results for input(s): INR, PROTIME in the last 168 hours. Cardiac Enzymes: No results for input(s): CKTOTAL, CKMB, CKMBINDEX, TROPONINI in the last 168 hours. BNP (last 3 results) No results for input(s): PROBNP in the last 8760 hours. HbA1C: No results for input(s): HGBA1C in the last 72 hours. CBG: No results for input(s): GLUCAP in the last 168 hours. Lipid Profile: Recent Labs    08/05/21 2230  TRIG 39   Thyroid Function Tests: No results for input(s): TSH, T4TOTAL, FREET4,  T3FREE, THYROIDAB in the last 72 hours. Anemia Panel: No results for input(s): VITAMINB12, FOLATE, FERRITIN, TIBC, IRON, RETICCTPCT in the last 72 hours. Urine analysis: No results found for: COLORURINE, APPEARANCEUR, LABSPEC, PHURINE, GLUCOSEU, HGBUR, BILIRUBINUR, KETONESUR, PROTEINUR, UROBILINOGEN, NITRITE, LEUKOCYTESUR   Itali Mckendry M.D. [facility] 08/07/2021, 3:12 PM  Available via Epic secure chat 7am-7pm After 7 pm, please refer to night coverage provider listed on amion.

## 2021-08-07 NOTE — Op Note (Addendum)
[facility] Patient Name: Hunter Ward Procedure Date: 08/07/2021 MRN: [id number] Attending MD: Ronnette Juniper , MD Date of Birth: 1950-12-12 CSN: [id number] Age: 70 Admit Type: Inpatient Procedure:                ERCP Indications:              Common bile duct stone(s), For therapy of acute                            pancreatitis, gallstones in GB remnant, abnormal                            LFTs Providers:                Ronnette Juniper, MD, Dulcy Fanny, Hinton Dyer Referring MD:             [facility] Medicines:                Monitored Anesthesia Care Complications:            No immediate complications. Estimated blood loss:                            Minimal. Estimated Blood Loss:     Estimated blood loss was minimal. Procedure:                Pre-Anesthesia Assessment:                           - Prior to the procedure, a History and Physical                            was performed, and patient medications and                            allergies were reviewed. The patient's tolerance of                            previous anesthesia was also reviewed. The risks                            and benefits of the procedure and the sedation                            options and risks were discussed with the patient.                            All questions were answered, and informed consent                            was obtained. Prior Anticoagulants: The patient has                            taken no previous anticoagulant or antiplatelet                            agents. ASA  Grade Assessment: II - A patient with                            mild systemic disease. After reviewing the risks                            and benefits, the patient was deemed in                            satisfactory condition to undergo the procedure.                           After obtaining informed consent, the scope was                            passed under direct vision.  Throughout the                            procedure, the patient's blood pressure, pulse, and                            oxygen saturations were monitored continuously. The                            [id number] Olympus duodenoscope was                            introduced through the mouth, and used to inject                            contrast into and used to inject contrast into the                            bile duct and ventral pancreatic duct. The ERCP was                            extremely difficult due to severe edema and                            congested duodenal folds. The patient tolerated the                            procedure well. Scope In: Scope Out: Findings:      The scout film was normal. The esophagus was successfully intubated       under direct vision. The scope was advanced to a normal major papilla in       the descending duodenum without detailed examination of the pharynx,       larynx and associated structures, and upper GI tract. The stomach was       grossly normal.      However, the duodenal folds were severely congested, edematous and       inflammed. It took a prolonged effort to advance the scope into the 2nd       portion of duodenum and  again to find the ampulla, which was embedded       between two severely edematous duodenal folds.      After a prolonged effort, initial canulation led the wire into the       pancreatic which was injected inadvertently. Subsequently, all the       contrast was suctioned through the sphinctertome and one 5 Fr by 5 cm       plastic stent with a single external pigtail and no internal flaps was       placed 4.5 cm into the ventral pancreatic duct. Clear fluid flowed       through the stent. The stent was in good position.      Even after placement of the pancreatic stent, it was extremely       challenging to canulate the bile duct.      Eventually, the bile duct was deeply cannulated with the  sphincterotome.       Contrast was injected. I personally interpreted the bile duct and       pancreatic duct images. There was brisk flow of contrast through the       ducts. Image quality was excellent. Contrast extended to the main bile       duct. The middle third of the main bile duct contained two stones, the       largest of which was 6 mm in diameter. The upper third of the main bile       duct was severely dilated. The largest diameter was 15 mm. A straight       Roadrunner wire was passed into the biliary tree. A 10 mm biliary       sphincterotomy was made with a braided sphincterotome using ERBE       electrocautery. The sphincterotomy oozed minimal amount of blood. The       biliary tree was swept with a 12 mm balloon starting at the bifurcation.       Sludge was swept from the duct. Two stones were removed. No stones       remained. Impression:               - The upper third of the main bile duct was                            severely dilated.                           - Choledocholithiasis was found. Complete removal                            was accomplished by biliary sphincterotomy and                            balloon extraction.                           - A biliary sphincterotomy was performed.                           - The biliary tree was swept.                           -  One plastic stent was placed into the ventral                            pancreatic duct. Moderate Sedation:      Patient did not receive moderate sedation for this procedure, but       instead received monitored anesthesia care. Recommendation:           - Clear liquid diet today if tolerated.                           - Management of severe pancreatitis noted on MRCP-                            IVF at atleast 116m/hr, aquedate pain                            management,advance diet at tolerated.                           - Will need removal of stones from GB remnant,                             either via surgery or at a tertiary center with                            advanced GI. Procedure Code(s):        --- Professional ---                           [phone] Endoscopic retrograde                            cholangiopancreatography (ERCP); with placement of                            endoscopic stent into biliary or pancreatic duct,                            including pre- and post-dilation and guide wire                            passage, when performed, including sphincterotomy,                            when performed, each stent                           [id number], Endoscopic retrograde                            cholangiopancreatography (ERCP); with removal of                            calculi/debris from biliary/pancreatic duct(s)  [id number], 59, Endoscopic retrograde                            cholangiopancreatography (ERCP); with                            sphincterotomy/papillotomy Diagnosis Code(s):        --- Professional ---                           K80.50, Calculus of bile duct without cholangitis                            or cholecystitis without obstruction                           K85.90, Acute pancreatitis without necrosis or                            infection, unspecified                           K83.8, Other specified diseases of biliary tract CPT copyright 2019 [employer]. All rights reserved. The codes documented in this report are preliminary and upon coder review may  be revised to meet current compliance requirements. Ronnette Juniper, MD 08/07/2021 5:07:56 PM This report has been signed electronically. Number of Addenda: 0

## 2021-08-07 NOTE — Progress Notes (Signed)
Pharmacy Antibiotic Note  Hunter Ward is a 70 y.o. male admitted on 08/05/2021 with acute necrotizing pancreatitis, planning ERCP on 8/29.  Pharmacy has been consulted for Meropenem dosing.  Plan: Meropenem 1g IV q8h Follow up renal function, culture results, and clinical course.   Height: '6\' 1"'$  (185.4 cm) Weight: 73.9 kg (162 lb 14.7 oz) IBW/kg (Calculated) : 79.9  Temp (24hrs), Avg:99.3 F (37.4 C), Min:98.9 F (37.2 C), Max:99.7 F (37.6 C)  Recent Labs  Lab 08/05/21 1632 08/06/21 0459 08/07/21 0420 08/07/21 0851  WBC 13.2* 19.8*  --  23.6*  CREATININE 0.99 0.86 0.74  --     Estimated Creatinine Clearance: 88.5 mL/min (by C-G formula based on SCr of 0.74 mg/dL).    Allergies  Allergen Reactions   Codeine Itching and Nausea And Vomiting    Antimicrobials this admission: 8/29 Cipro x1 8/29 Meropenem >>   Dose adjustments this admission:   Microbiology results: 8/29 BCx:   Thank you for allowing pharmacy to be a part of this patient's care. Gretta Arab PharmD, BCPS Clinical Pharmacist [facility] main pharmacy [phone] 08/07/2021 3:29 PM

## 2021-08-07 NOTE — Progress Notes (Signed)
Mobility Specialist - Progress Note     08/07/21 1051  Mobility  Activity Ambulated in hall  Level of Assistance Standby assist, set-up cues, supervision of patient - no hands on  Assistive Device Front wheel walker  Distance Ambulated (ft) 350 ft  Mobility Ambulated with assistance in hallway  Mobility Response Tolerated well  Mobility performed by Mobility specialist  $Mobility charge 1 Mobility    Pt required set up cues and extra time before beginning ambulation of ~350 ft. RW was utilized during session and no reports of pain, dizziness, or SOB were presented. Pt returned to bed after session and was left with call bell at side.   [place] [facility]ist [facility] Phone: [phone] 08/07/21, 10:56 AM

## 2021-08-07 NOTE — Anesthesia Procedure Notes (Signed)
Procedure Name: Intubation Date/Time: 08/07/2021 2:58 PM Performed by: Cleda Daub, CRNA Pre-anesthesia Checklist: Patient identified, Emergency Drugs available, Suction available and Patient being monitored Patient Re-evaluated:Patient Re-evaluated prior to induction Oxygen Delivery Method: Circle system utilized Preoxygenation: Pre-oxygenation with 100% oxygen Induction Type: IV induction Ventilation: Mask ventilation without difficulty and Oral airway inserted - appropriate to patient size Laryngoscope Size: Mac and 4 Grade View: Grade I Tube type: Oral Tube size: 7.5 mm Number of attempts: 1 Airway Equipment and Method: Stylet and Oral airway Placement Confirmation: ETT inserted through vocal cords under direct vision, positive ETCO2 and breath sounds checked- equal and bilateral Secured at: 22 cm Tube secured with: Tape Dental Injury: Teeth and Oropharynx as per pre-operative assessment

## 2021-08-08 ENCOUNTER — Encounter (HOSPITAL_COMMUNITY): Payer: Self-pay | Admitting: Gastroenterology

## 2021-08-08 ENCOUNTER — Inpatient Hospital Stay (HOSPITAL_COMMUNITY): Payer: Medicare Other

## 2021-08-08 LAB — COMPREHENSIVE METABOLIC PANEL
ALT: 73 U/L — ABNORMAL HIGH (ref 0–44)
AST: 34 U/L (ref 15–41)
Albumin: 2.6 g/dL — ABNORMAL LOW (ref 3.5–5.0)
Alkaline Phosphatase: 62 U/L (ref 38–126)
Anion gap: 4 — ABNORMAL LOW (ref 5–15)
BUN: 21 mg/dL (ref 8–23)
CO2: 27 mmol/L (ref 22–32)
Calcium: 8.2 mg/dL — ABNORMAL LOW (ref 8.9–10.3)
Chloride: 107 mmol/L (ref 98–111)
Creatinine, Ser: 0.78 mg/dL (ref 0.61–1.24)
GFR, Estimated: >60 mL/min (ref >60)
Glucose, Bld: 110 mg/dL — ABNORMAL HIGH (ref 70–99)
Potassium: 4.1 mmol/L (ref 3.5–5.1)
Sodium: 138 mmol/L (ref 135–145)
Total Bilirubin: 1.3 mg/dL — ABNORMAL HIGH (ref 0.3–1.2)
Total Protein: 5.1 g/dL — ABNORMAL LOW (ref 6.5–8.1)

## 2021-08-08 LAB — CBC
HCT: 35.3 % — ABNORMAL LOW (ref 39.0–52.0)
Hemoglobin: 11.5 g/dL — ABNORMAL LOW (ref 13.0–17.0)
MCH: 33 pg (ref 26.0–34.0)
MCHC: 32.6 g/dL (ref 30.0–36.0)
MCV: 101.4 fL — ABNORMAL HIGH (ref 80.0–100.0)
Platelets: 82 10*3/uL — ABNORMAL LOW (ref 150–400)
RBC: 3.48 MIL/uL — ABNORMAL LOW (ref 4.22–5.81)
RDW: 13.8 % (ref 11.5–15.5)
WBC: 14.4 10*3/uL — ABNORMAL HIGH (ref 4.0–10.5)
nRBC: 0 % (ref 0.0–0.2)

## 2021-08-08 LAB — LIPASE, BLOOD: Lipase: 73 U/L — ABNORMAL HIGH (ref 11–51)

## 2021-08-08 MED ORDER — DOCUSATE SODIUM 100 MG PO CAPS
100.0000 mg | ORAL_CAPSULE | Freq: Two times a day (BID) | ORAL | Status: DC
  Administered 2021-08-08 – 2021-08-12 (×8): 100 mg via ORAL
  Filled 2021-08-08 (×8): qty 1

## 2021-08-08 MED ORDER — POLYETHYLENE GLYCOL 3350 17 G PO PACK
17.0000 g | PACK | Freq: Every day | ORAL | Status: DC | PRN
  Administered 2021-08-08: 17 g via ORAL
  Filled 2021-08-08: qty 1

## 2021-08-08 MED ORDER — PIPERACILLIN-TAZOBACTAM 3.375 G IVPB
3.3750 g | Freq: Three times a day (TID) | INTRAVENOUS | Status: DC
  Administered 2021-08-08 – 2021-08-10 (×6): 3.375 g via INTRAVENOUS
  Filled 2021-08-08 (×6): qty 50

## 2021-08-08 NOTE — Progress Notes (Signed)
Niece: Margarita Grizzle Caudle(contact) [phone] with request for Covering Medical Provider(MD) to please call with Patient status and update.   Number also noted on white board in room . Day shift nurse made aware.

## 2021-08-08 NOTE — Progress Notes (Signed)
[facility]                                                                              Patient Demographics  Hunter Ward, is a 70 y.o. male, DOB - 1951/01/29, VQX:[id number]  Admit date - 08/05/2021   Admitting Physician Orene Desanctis, DO  Outpatient Primary MD for the patient is Gaynelle Arabian, MD  Outpatient specialists:   LOS - 3  days   Medical records reviewed and are as summarized below:    Chief Complaint  Patient presents with   Abdominal Pain   Emesis       Brief summary   70 year old male with hypertension, prior cholecystectomy presented with nausea vomiting and abdominal pain. + Constipation In ED, afebrile, normotensive, lipase > 1600 CT abdomen showed acute pancreatitis with retained stone in gallbladder fossa, CBD 9 mm. Elevated LFTs AST 279, ALT 277, alk phos 129, bilirubin 3.9.  No alcohol use    Assessment & Plan    Principal Problem:   Acute severe gallstone pancreatitis with necrosis, transaminitis, choledocholithiasis -CT abdomen showed acute pancreatitis with retained stone in gallbladder fossa, CBD 9 mm, presented with nausea, vomiting, abdominal pain with elevated LFTs. -Triglycerides 39, no history of alcohol use -MRCP reviewed, areas concerning for necrosis in the pancreatic head and neck concerning for in the setting of severe acute pancreatitis.  Severe intra and extrahepatic biliary ductal dilatation, choledocholithiasis. -Procalcitonin 0.6, leukocytosis, abdominal pain, transaminitis, placed on meropenem (high risk of mortality with acute necrotizing pancreatitis) -Underwent ERCP 8/29 ([facility]), recommended surgical evaluation -Patient is tolerating clears, continue pain control, bowel regimen, IV fluid hydration  Active Problems:   HTN (hypertension) -BP stable  Constipation -Placed on Colace, MiraLAX    Code Status: Full CODE STATUS DVT Prophylaxis:  enoxaparin (LOVENOX) injection 40 mg Start: 08/05/21  2230   Level of Care: Level of care: Med-Surg Family Communication: Discussed all imaging results, lab results, explained to the patient   Patient was examined and all questions/concerns were addressed with the assistance of ASL interpreter, Levada Dy # [phone]  Disposition Plan:     Status is: Inpatient  Remains inpatient appropriate because:Inpatient level of care appropriate due to severity of illness  Dispo: The patient is from: Home              Anticipated d/c is to: Home              Patient currently is not medically stable to d/c.   Difficult to place patient No      Time Spent in minutes   25 minutes  Procedures:  MRCP ERCP  Consultants:   Gastroenterology Surgery  Antimicrobials:   Anti-infectives (From admission, onward)    Start     Dose/Rate Route Frequency Ordered Stop   08/08/21 1800  piperacillin-tazobactam (ZOSYN) IVPB 3.375 g        3.375 g 12.5 mL/hr over 240 Minutes Intravenous Every 8 hours 08/08/21 1117     08/08/21 0200  meropenem (MERREM) 1 g in sodium chloride 0.9 % 100 mL IVPB  Status:  Discontinued        1 g  200 mL/hr over 30 Minutes Intravenous Every 8 hours 08/07/21 1605 08/08/21 1115   08/07/21 1530  meropenem (MERREM) 1 g in sodium chloride 0.9 % 100 mL IVPB        1 g 200 mL/hr over 30 Minutes Intravenous  Once 08/07/21 1526 08/07/21 1913          Medications  Scheduled Meds:  bisacodyl  10 mg Rectal Once   enoxaparin (LOVENOX) injection  40 mg Subcutaneous Q24H   ondansetron (ZOFRAN) IV  4 mg Intravenous Once   Continuous Infusions:  lactated ringers 150 mL/hr at 08/08/21 0609   piperacillin-tazobactam (ZOSYN)  IV     PRN Meds:.HYDROmorphone (DILAUDID) injection, ondansetron (ZOFRAN) IV      Subjective:   Hunter Ward was seen and examined today with assistance of ASL interpreter.  Complains of feeling cold and still having abdominal pain in the right upper quadrant and epigastric area.  Afebrile.  No acute nausea or  vomiting.  Objective:   Vitals:   08/08/21 0131 08/08/21 0627 08/08/21 0942 08/08/21 1317  BP: 111/68 114/69 112/65 109/65  Pulse: 65 64 (!) 59 60  Resp: _0 Temp: 98.1 F (36.7 C) 98 F (36.7 C) 98.3 F (36.8 C) 98.6 F (37 C)  TempSrc: Oral Oral Oral Oral  SpO2: 97% 98% 96% 95%  Weight:      Height:        Intake/Output Summary (Last 24 hours) at 08/08/2021 1350 Last data filed at 08/08/2021 1000 Gross per 24 hour  Intake 4352.62 ml  Output 1250 ml  Net 3102.62 ml     Wt Readings from Last 3 Encounters:  08/07/21 73.9 kg   Physical Exam General: Alert and oriented x 3, NAD Cardiovascular: S1 S2 clear, RRR. No pedal edema b/l Respiratory: CTAB, no wheezing, rales or rhonchi Gastrointestinal: Soft, diffuse TTP, worse in RUQ,  nondistended, NBS Ext: no pedal edema bilaterally Neuro: no new deficits Psych: Normal affect and demeanor, alert and oriented x3    Data Reviewed:  I have personally reviewed following labs and imaging studies  Micro Results Recent Results (from the past 240 hour(s))  Resp Panel by RT-PCR (Flu A&B, Covid) Nasopharyngeal Swab     Status: None   Collection Time: 08/05/21  6:46 PM   Specimen: Nasopharyngeal Swab; Nasopharyngeal(NP) swabs in vial transport medium  Result Value Ref Range Status   SARS Coronavirus 2 by RT PCR NEGATIVE NEGATIVE Final    Comment: (NOTE) SARS-CoV-2 target nucleic acids are NOT DETECTED.  The SARS-CoV-2 RNA is generally detectable in upper respiratory specimens during the acute phase of infection. The lowest concentration of SARS-CoV-2 viral copies this assay can detect is 138 copies/mL. A negative result does not preclude SARS-Cov-2 infection and should not be used as the sole basis for treatment or other patient management decisions. A negative result may occur with  improper specimen collection/handling, submission of specimen other than nasopharyngeal swab, presence of viral mutation(s) within  the areas targeted by this assay, and inadequate number of viral copies(<138 copies/mL). A negative result must be combined with clinical observations, patient history, and epidemiological information. The expected result is Negative.  Fact Sheet for Patients:  EntrepreneurPulse.com.au  Fact Sheet for Healthcare Providers:  IncredibleEmployment.be  This test is no t yet approved or cleared by the [place] FDA and  has been authorized for detection and/or diagnosis of SARS-CoV-2 by FDA under an Emergency Use Authorization (EUA). This EUA will remain  in effect (  meaning this test can be used) for the duration of the COVID-19 declaration under Section 564(b)(1) of the Act, 21 U.S.C.section 360bbb-3(b)(1), unless the authorization is terminated  or revoked sooner.       Influenza A by PCR NEGATIVE NEGATIVE Final   Influenza B by PCR NEGATIVE NEGATIVE Final    Comment: (NOTE) The Xpert Xpress SARS-CoV-2/FLU/RSV plus assay is intended as an aid in the diagnosis of influenza from Nasopharyngeal swab specimens and should not be used as a sole basis for treatment. Nasal washings and aspirates are unacceptable for Xpert Xpress SARS-CoV-2/FLU/RSV testing.  Fact Sheet for Patients: EntrepreneurPulse.com.au  Fact Sheet for Healthcare Providers: IncredibleEmployment.be  This test is not yet approved or cleared by the [place] FDA and has been authorized for detection and/or diagnosis of SARS-CoV-2 by FDA under an Emergency Use Authorization (EUA). This EUA will remain in effect (meaning this test can be used) for the duration of the COVID-19 declaration under Section 564(b)(1) of the Act, 21 U.S.C. section 360bbb-3(b)(1), unless the authorization is terminated or revoked.  Performed at [facility], [address] [place], [place] [address]   Culture, blood (routine x 2)     Status:  None (Preliminary result)   Collection Time: 08/07/21  6:57 PM   Specimen: BLOOD  Result Value Ref Range Status   Specimen Description   Final    BLOOD LEFT ANTECUBITAL Performed at [facility] [address] [place], [place] [address]    Special Requests   Final    BOTTLES DRAWN AEROBIC ONLY Blood Culture adequate volume Performed at [facility] [address], [place], [place] [address]    Culture   Final    NO GROWTH < 12 HOURS Performed at [facility] [address], [place], [place] [address]    Report Status PENDING  Incomplete  Culture, blood (routine x 2)     Status: None (Preliminary result)   Collection Time: 08/07/21  6:57 PM   Specimen: BLOOD  Result Value Ref Range Status   Specimen Description   Final    BLOOD RIGHT ANTECUBITAL Performed at [facility] [address] [place], [place] [address]    Special Requests   Final    BOTTLES DRAWN AEROBIC ONLY Blood Culture adequate volume Performed at [facility] [address] [place], [place] [address]    Culture   Final    NO GROWTH < 12 HOURS Performed at [facility] [address], [place],  [address]    Report Status PENDING  Incomplete    Radiology Reports CT ABDOMEN PELVIS W CONTRAST  Result Date: 08/05/2021 CLINICAL DATA:  Persistent vomiting with history of prior cholecystectomy. EXAM: CT ABDOMEN AND PELVIS WITH CONTRAST TECHNIQUE: Multidetector CT imaging of the abdomen and pelvis was performed using the standard protocol following bolus administration of intravenous contrast. CONTRAST:  60m OMNIPAQUE IOHEXOL 350 MG/ML SOLN COMPARISON:  None. FINDINGS: Lower chest: No acute abnormality. Hepatobiliary: No focal liver abnormality is seen. Status post cholecystectomy. 9 mm and 14 mm calcifications are seen within the inferior aspect of the gallbladder fossa. The common bile duct measures approximately 9  mm. Pancreas: Marked severity peripancreatic inflammatory fat stranding and peripancreatic fluid is seen. This extends to involve the left upper quadrant and mid and lower portions of the right abdomen. Diffuse enlargement of the pancreatic head and uncinate process is also noted (approximately 3.6 cm x 3.6 cm (axial  CT image 35, CT series 2). Spleen: Normal in size without focal abnormality. Adrenals/Urinary Tract: A 2.4 cm x 1.9 cm well-defined low-attenuation (approximately 15.91 Hounsfield units) left adrenal mass is noted. The right adrenal gland is normal in size and appearance. Kidneys are normal, without renal calculi, focal lesion, or hydronephrosis. Bladder is unremarkable. Stomach/Bowel: There is a small hiatal hernia. Appendix appears normal. No evidence of bowel dilatation. Vascular/Lymphatic: Aortic atherosclerosis. No enlarged abdominal or pelvic lymph nodes. Reproductive: There is mild to moderate severity prostate gland enlargement. Other: No abdominal wall hernia or abnormality. No abdominopelvic ascites. Musculoskeletal: Multilevel degenerative changes are seen throughout the lumbar spine. IMPRESSION: 1. Marked severity acute pancreatitis with associated inflammatory changes. 2. Small retained gallstones within the gallbladder fossa, status post cholecystectomy. 3. Left adrenal mass which may represent an adrenal adenoma. 4. Small hiatal hernia. 5. Mild to moderate severity prostate gland enlargement. 6. Aortic atherosclerosis. Aortic Atherosclerosis (ICD10-I70.0). Electronically Signed   By: Virgina Norfolk M.D.   On: 08/05/2021 18:18   MR 3D Recon At Scanner  Result Date: 08/06/2021 CLINICAL DATA:  Pancreatitis, jaundice, nausea, vomiting, history of cholecystectomy EXAM: MRI ABDOMEN WITHOUT AND WITH CONTRAST (INCLUDING MRCP) TECHNIQUE: Multiplanar multisequence MR imaging of the abdomen was performed both before and after the administration of intravenous contrast. Heavily T2-weighted  images of the biliary and pancreatic ducts were obtained, and three-dimensional MRCP images were rendered by post processing. CONTRAST:  7.73mL GADAVIST GADOBUTROL 1 MMOL/ML IV SOLN COMPARISON:  CT abdomen pelvis, 08/05/2021 FINDINGS: Lower chest: No acute findings. Hepatobiliary: No mass or other parenchymal abnormality identified. Status post cholecystectomy with a sizable gallbladder remnant in the gallbladder fossa containing multiple gallstones. There are two gallstones within the common bile duct in the pancreatic head, approximately 4 and 5 cm from the ampulla, measuring 5-6 mm each (series 6, image 15). There is severe intra and extrahepatic biliary ductal dilatation, the common bile duct measuring up to 1.5 cm in caliber. Pancreas: Very extensive, diffuse edema of the pancreatic parenchyma with adjacent peripancreatic fat stranding and fluid (series 3, image 20). There are areas of parenchymal hypoenhancement, particularly of the pancreatic head and neck (series 29, image 53, 37). No pancreatic ductal dilatation. No acute pancreatic fluid collection. Spleen:  Within normal limits in size and appearance. Adrenals/Urinary Tract: There is a definitively benign, fat containing left adrenal adenoma (series 9, image 27). No evidence of hydronephrosis. Stomach/Bowel: Visualized portions within the abdomen are unremarkable. Vascular/Lymphatic: No pathologically enlarged lymph nodes identified. No abdominal aortic aneurysm demonstrated. Other:  Small volume ascites. Musculoskeletal: No suspicious bone lesions identified. IMPRESSION: 1. Very extensive, diffuse edema of the pancreatic parenchyma with adjacent peripancreatic fat stranding and fluid. There are areas of parenchymal hypoenhancement, particularly of the pancreatic head and neck, concerning for necrosis in the setting of severe acute pancreatitis. No evidence of acute pancreatic fluid collection. 2. Severe intra and extrahepatic biliary ductal dilatation,  the common bile duct measuring up to 1.5 cm in caliber. Choledocholithiasis with two gallstones within the common bile duct in the pancreatic head measuring 5-6 mm each, approximately 4 and 5 cm from the ampulla. 3. Status post cholecystectomy with a sizable gallbladder remnant in the gallbladder fossa containing multiple gallstones. 4. Small volume ascites. These results will be called to the ordering clinician or representative by the Radiologist Assistant, and communication documented in the PACS or [employer]. Electronically Signed   By: Eddie Candle M.D.   On: 08/06/2021 20:19   DG ERCP BILIARY & PANCREATIC  DUCTS  Result Date: 08/08/2021 CLINICAL DATA:  Choledocholithiasis. EXAM: INTRAOPERATIVE CHOLANGIOGRAM TECHNIQUE: Cholangiographic images from the C-arm fluoroscopic device were submitted for interpretation post-operatively. Please see the procedural report for the amount of contrast and the fluoroscopy time utilized. COMPARISON:  CT Abdomen Pelvis, 08/05/2021. Abdominal ultrasound, 08/06/2021. MR abdomen, 08/06/2021. FINDINGS: Fluoroscopy Time:  7 minutes, 32 seconds Radiation Exposure Index (if provided by the fluoroscopic device): None provided. Number of Acquired Spot Images: 11. Multiple, limited oblique planar views of the RIGHT upper quadrant demonstrating a flexible endoscope, pancreatic duct cannulation and plastic stent placement common common bile duct cannulation, cholangiogram, balloon sweep, and plastic biliary stent placement. No discrete filling defect demonstrated. IMPRESSION: Intra procedural fluoroscopic imaging for ERCP, biliary and pancreatic duct stent placements. Please see performing service dictation for complete description of intra procedural findings. Electronically Signed   By: Michaelle Birks M.D.   On: 08/08/2021 07:47   MR ABDOMEN MRCP W WO CONTAST  Result Date: 08/06/2021 CLINICAL DATA:  Pancreatitis, jaundice, nausea, vomiting, history of cholecystectomy EXAM: MRI  ABDOMEN WITHOUT AND WITH CONTRAST (INCLUDING MRCP) TECHNIQUE: Multiplanar multisequence MR imaging of the abdomen was performed both before and after the administration of intravenous contrast. Heavily T2-weighted images of the biliary and pancreatic ducts were obtained, and three-dimensional MRCP images were rendered by post processing. CONTRAST:  7.63m GADAVIST GADOBUTROL 1 MMOL/ML IV SOLN COMPARISON:  CT abdomen pelvis, 08/05/2021 FINDINGS: Lower chest: No acute findings. Hepatobiliary: No mass or other parenchymal abnormality identified. Status post cholecystectomy with a sizable gallbladder remnant in the gallbladder fossa containing multiple gallstones. There are two gallstones within the common bile duct in the pancreatic head, approximately 4 and 5 cm from the ampulla, measuring 5-6 mm each (series 6, image 15). There is severe intra and extrahepatic biliary ductal dilatation, the common bile duct measuring up to 1.5 cm in caliber. Pancreas: Very extensive, diffuse edema of the pancreatic parenchyma with adjacent peripancreatic fat stranding and fluid (series 3, image 20). There are areas of parenchymal hypoenhancement, particularly of the pancreatic head and neck (series 29, image 53, 37). No pancreatic ductal dilatation. No acute pancreatic fluid collection. Spleen:  Within normal limits in size and appearance. Adrenals/Urinary Tract: There is a definitively benign, fat containing left adrenal adenoma (series 9, image 27). No evidence of hydronephrosis. Stomach/Bowel: Visualized portions within the abdomen are unremarkable. Vascular/Lymphatic: No pathologically enlarged lymph nodes identified. No abdominal aortic aneurysm demonstrated. Other:  Small volume ascites. Musculoskeletal: No suspicious bone lesions identified. IMPRESSION: 1. Very extensive, diffuse edema of the pancreatic parenchyma with adjacent peripancreatic fat stranding and fluid. There are areas of parenchymal hypoenhancement, particularly  of the pancreatic head and neck, concerning for necrosis in the setting of severe acute pancreatitis. No evidence of acute pancreatic fluid collection. 2. Severe intra and extrahepatic biliary ductal dilatation, the common bile duct measuring up to 1.5 cm in caliber. Choledocholithiasis with two gallstones within the common bile duct in the pancreatic head measuring 5-6 mm each, approximately 4 and 5 cm from the ampulla. 3. Status post cholecystectomy with a sizable gallbladder remnant in the gallbladder fossa containing multiple gallstones. 4. Small volume ascites. These results will be called to the ordering clinician or representative by the Radiologist Assistant, and communication documented in the PACS or [employer] Electronically Signed   By: AEddie CandleM.D.   On: 08/06/2021 20:19   [place]Abdomen Limited RUQ (LIVER/GB)  Result Date: 08/06/2021 CLINICAL DATA:  Elevated serum total bilirubin levels. Prior cholecystectomy. EXAM: ULTRASOUND ABDOMEN  LIMITED RIGHT UPPER QUADRANT COMPARISON:  January 17, 2021 FINDINGS: Gallbladder: The gallbladder is surgically absent. A 1.2 cm shadowing echogenic focus is noted within the gallbladder fossa. Common bile duct: Diameter: 15.1 mm Liver: No focal lesion identified. Within normal limits in parenchymal echogenicity. Portal vein is patent on color Doppler imaging with normal direction of blood flow towards the liver. Other: None. IMPRESSION: 1. Evidence of prior cholecystectomy with a retained gallstones suspected within the gallbladder fossa. Electronically Signed   By: Virgina Norfolk M.D.   On: 08/06/2021 00:56    Lab Data:  CBC: Recent Labs  Lab 08/05/21 1632 08/06/21 0459 08/07/21 0851 08/08/21 0419  WBC 13.2* 19.8* 23.6* 14.4*  NEUTROABS 12.3*  --   --   --   HGB 14.2 14.9 14.3 11.5*  HCT 42.0 43.9 44.1 35.3*  MCV 97.7 98.0 102.1* 101.4*  PLT 153 144* 108* 82*   Basic Metabolic Panel: Recent Labs  Lab 08/05/21 1632 08/06/21 0459  08/07/21 0420 08/08/21 0419  NA 138 137 137 138  K 4.0 4.2 4.2 4.1  CL 101 104 105 107  CO2 _0 GLUCOSE 147* 129* 101* 110*  BUN _1 CREATININE 0.99 0.86 0.74 0.78  CALCIUM 9.8 9.1 8.7* 8.2*   GFR: Estimated Creatinine Clearance: 88.5 mL/min (by C-G formula based on SCr of 0.78 mg/dL). Liver Function Tests: Recent Labs  Lab 08/05/21 1632 08/06/21 0459 08/07/21 0420 08/08/21 0419  AST 279* 220* 61* 34  ALT 277* 239* 128* 73*  ALKPHOS 129* 123 79 62  BILITOT 3.9* 3.6* 1.9* 1.3*  PROT 7.0 6.6 5.8* 5.1*  ALBUMIN 4.1 3.9 3.1* 2.6*   Recent Labs  Lab 08/05/21 1632 08/06/21 0459 08/07/21 0420 08/08/21 0419  LIPASE 1,649* 771* 177* 73*   No results for input(s): AMMONIA in the last 168 hours. Coagulation Profile: No results for input(s): INR, PROTIME in the last 168 hours. Cardiac Enzymes: No results for input(s): CKTOTAL, CKMB, CKMBINDEX, TROPONINI in the last 168 hours. BNP (last 3 results) No results for input(s): PROBNP in the last 8760 hours. HbA1C: No results for input(s): HGBA1C in the last 72 hours. CBG: No results for input(s): GLUCAP in the last 168 hours. Lipid Profile: Recent Labs    08/05/21 2230  TRIG 39   Thyroid Function Tests: No results for input(s): TSH, T4TOTAL, FREET4, T3FREE, THYROIDAB in the last 72 hours. Anemia Panel: No results for input(s): VITAMINB12, FOLATE, FERRITIN, TIBC, IRON, RETICCTPCT in the last 72 hours. Urine analysis: No results found for: COLORURINE, APPEARANCEUR, LABSPEC, PHURINE, GLUCOSEU, HGBUR, BILIRUBINUR, KETONESUR, PROTEINUR, UROBILINOGEN, NITRITE, LEUKOCYTESUR   Derik Fults M.D. [facility] 08/08/2021, 1:50 PM  Available via Epic secure chat 7am-7pm After 7 pm, please refer to night coverage provider listed on amion.

## 2021-08-08 NOTE — Progress Notes (Signed)
Subjective: Seen and examined at bedside. Sign language interpretation used, Collier [place] [phone]. Patient able to tolerate clear liquids and is asking about grits and scrambled eggs. He has been passing gas but is frustrated about not being able to have a bowel movement. He complains of mild abdominal pain. Complains of feeling excessively cold.  Objective: Vital signs in last 24 hours: Temp:  [98 F (36.7 C)-99.5 F (37.5 C)] 98.3 F (36.8 C) (08/30 0942) Pulse Rate:  [59-90] 59 (08/30 0942) Resp:  [10-19] 14 (08/30 0942) BP: (109-141)/(65-100) 112/65 (08/30 0942) SpO2:  [95 %-98 %] 96 % (08/30 0942) Weight:  [73.9 kg] 73.9 kg (08/29 1406) Weight change:     PE: No pallor, no icterus, moist mucous membranes GENERAL: Covered in blankets, not in distress  ABDOMEN: Mild upper abdominal tenderness, sluggish bowel sounds EXTREMITIES: No edema  Lab Results: Results for orders placed or performed during the hospital encounter of 08/05/21 (from the past 48 hour(s))  Comprehensive metabolic panel     Status: Abnormal   Collection Time: 08/07/21  4:20 AM  Result Value Ref Range   Sodium 137 135 - 145 mmol/L   Potassium 4.2 3.5 - 5.1 mmol/L   Chloride 105 98 - 111 mmol/L   CO2 26 22 - 32 mmol/L   Glucose, Bld 101 (H) 70 - 99 mg/dL    Comment: Glucose reference range applies only to samples taken after fasting for at least 8 hours.   BUN 23 8 - 23 mg/dL   Creatinine, Ser 0.74 0.61 - 1.24 mg/dL   Calcium 8.7 (L) 8.9 - 10.3 mg/dL   Total Protein 5.8 (L) 6.5 - 8.1 g/dL   Albumin 3.1 (L) 3.5 - 5.0 g/dL   AST 61 (H) 15 - 41 U/L   ALT 128 (H) 0 - 44 U/L   Alkaline Phosphatase 79 38 - 126 U/L   Total Bilirubin 1.9 (H) 0.3 - 1.2 mg/dL   GFR, Estimated >60 >60 mL/min    Comment: (NOTE) Calculated using the CKD-EPI Creatinine Equation (2021)    Anion gap 6 5 - 15    Comment: Performed at [facility], [place] [address] [place], [place] [address]  Lipase, blood     Status:  Abnormal   Collection Time: 08/07/21  4:20 AM  Result Value Ref Range   Lipase 177 (H) 11 - 51 U/L    Comment: Performed at [facility], [place] [address] [place], [place] [address]  CBC     Status: Abnormal   Collection Time: 08/07/21  8:51 AM  Result Value Ref Range   WBC 23.6 (H) 4.0 - 10.5 K/uL   RBC 4.32 4.22 - 5.81 MIL/uL   Hemoglobin 14.3 13.0 - 17.0 g/dL   HCT 44.1 39.0 - 52.0 %   MCV 102.1 (H) 80.0 - 100.0 fL   MCH 33.1 26.0 - 34.0 pg   MCHC 32.4 30.0 - 36.0 g/dL   RDW 14.2 11.5 - 15.5 %   Platelets 108 (L) 150 - 400 K/uL    Comment: SPECIMEN CHECKED FOR CLOTS Immature Platelet Fraction may be clinically indicated, consider ordering this additional test [id number] REPEATED TO VERIFY PLATELET COUNT CONFIRMED BY SMEAR    nRBC 0.0 0.0 - 0.2 %    Comment: Performed at [facility], [place] [address], [place], [place] [address]  Procalcitonin - Baseline     Status: None   Collection Time: 08/07/21  8:51 AM  Result Value Ref Range  Procalcitonin 0.61 ng/mL    Comment:        Interpretation: PCT > 0.5 ng/mL and <= 2 ng/mL: Systemic infection (sepsis) is possible, but other conditions are known to elevate PCT as well. (NOTE)       Sepsis PCT Algorithm           Lower Respiratory Tract                                      Infection PCT Algorithm    ----------------------------     ----------------------------         PCT < 0.25 ng/mL                PCT < 0.10 ng/mL          Strongly encourage             Strongly discourage   discontinuation of antibiotics    initiation of antibiotics    ----------------------------     -----------------------------       PCT 0.25 - 0.50 ng/mL            PCT 0.10 - 0.25 ng/mL               OR       >80% decrease in PCT            Discourage initiation of                                            antibiotics      Encourage discontinuation           of antibiotics    ----------------------------      -----------------------------         PCT >= 0.50 ng/mL              PCT 0.26 - 0.50 ng/mL                AND       <80% decrease in PCT             Encourage initiation of                                             antibiotics       Encourage continuation           of antibiotics    ----------------------------     -----------------------------        PCT >= 0.50 ng/mL                  PCT > 0.50 ng/mL               AND         increase in PCT                  Strongly encourage                                      initiation of antibiotics    Strongly encourage escalation  of antibiotics                                     -----------------------------                                           PCT <= 0.25 ng/mL                                                 OR                                        > 80% decrease in PCT                                      Discontinue / Do not initiate                                             antibiotics  Performed at [place] [address], [place], [place] [address]   Culture, blood (routine x 2)     Status: None (Preliminary result)   Collection Time: 08/07/21  6:57 PM   Specimen: BLOOD  Result Value Ref Range   Specimen Description      BLOOD LEFT ANTECUBITAL Performed at [facility], [place] [address], [place], [place] [address]    Special Requests      BOTTLES DRAWN AEROBIC ONLY Blood Culture adequate volume Performed at [place] [address], [place], [place] [address]    Culture      NO GROWTH < 12 HOURS Performed at [place] [address], [place], [place] [address]    Report Status PENDING   Culture, blood (routine x 2)     Status: None (Preliminary result)   Collection Time: 08/07/21  6:57 PM   Specimen: BLOOD  Result Value Ref Range   Specimen Description      BLOOD RIGHT ANTECUBITAL Performed at [facility],  [place] [address] [place], [place] [address]    Special Requests      BOTTLES DRAWN AEROBIC ONLY Blood Culture adequate volume Performed at [place] [address] [place], [place] [address]    Culture      NO GROWTH < 12 HOURS Performed at [place] [address], [place], [place] [address]    Report Status PENDING   Comprehensive metabolic panel     Status: Abnormal   Collection Time: 08/08/21  4:19 AM  Result Value Ref Range   Sodium 138 135 - 145 mmol/L   Potassium 4.1 3.5 - 5.1 mmol/L   Chloride 107 98 - 111 mmol/L   CO2 27 22 - 32 mmol/L   Glucose, Bld 110 (H) 70 - 99 mg/dL    Comment: Glucose reference range applies only to samples taken  after fasting for at least 8 hours.   BUN 21 8 - 23 mg/dL   Creatinine, Ser 0.78 0.61 - 1.24 mg/dL   Calcium 8.2 (L) 8.9 - 10.3 mg/dL   Total Protein 5.1 (L) 6.5 - 8.1 g/dL   Albumin 2.6 (L) 3.5 - 5.0 g/dL   AST 34 15 - 41 U/L   ALT 73 (H) 0 - 44 U/L   Alkaline Phosphatase 62 38 - 126 U/L   Total Bilirubin 1.3 (H) 0.3 - 1.2 mg/dL   GFR, Estimated >60 >60 mL/min    Comment: (NOTE) Calculated using the CKD-EPI Creatinine Equation (2021)    Anion gap 4 (L) 5 - 15    Comment: Performed at [facility], [place] [address] [place], [place] [address]  Lipase, blood     Status: Abnormal   Collection Time: 08/08/21  4:19 AM  Result Value Ref Range   Lipase 73 (H) 11 - 51 U/L    Comment: Performed at [facility], [place] [address] [place], [place] [address]  CBC     Status: Abnormal   Collection Time: 08/08/21  4:19 AM  Result Value Ref Range   WBC 14.4 (H) 4.0 - 10.5 K/uL   RBC 3.48 (L) 4.22 - 5.81 MIL/uL   Hemoglobin 11.5 (L) 13.0 - 17.0 g/dL   HCT 35.3 (L) 39.0 - 52.0 %   MCV 101.4 (H) 80.0 - 100.0 fL   MCH 33.0 26.0 - 34.0 pg   MCHC 32.6 30.0 - 36.0 g/dL   RDW 13.8 11.5 - 15.5 %   Platelets 82 (L) 150 - 400 K/uL    Comment: SPECIMEN CHECKED FOR CLOTS Immature  Platelet Fraction may be clinically indicated, consider ordering this additional test [id number] CONSISTENT WITH PREVIOUS RESULT REPEATED TO VERIFY    nRBC 0.0 0.0 - 0.2 %    Comment: Performed at [facility], [place] [address], [place], [place] [address]    Studies/Results: MR 3D Recon At Scanner  Result Date: 08/06/2021 CLINICAL DATA:  Pancreatitis, jaundice, nausea, vomiting, history of cholecystectomy EXAM: MRI ABDOMEN WITHOUT AND WITH CONTRAST (INCLUDING MRCP) TECHNIQUE: Multiplanar multisequence MR imaging of the abdomen was performed both before and after the administration of intravenous contrast. Heavily T2-weighted images of the biliary and pancreatic ducts were obtained, and three-dimensional MRCP images were rendered by post processing. CONTRAST:  7.64m GADAVIST GADOBUTROL 1 MMOL/ML IV SOLN COMPARISON:  CT abdomen pelvis, 08/05/2021 FINDINGS: Lower chest: No acute findings. Hepatobiliary: No mass or other parenchymal abnormality identified. Status post cholecystectomy with a sizable gallbladder remnant in the gallbladder fossa containing multiple gallstones. There are two gallstones within the common bile duct in the pancreatic head, approximately 4 and 5 cm from the ampulla, measuring 5-6 mm each (series 6, image 15). There is severe intra and extrahepatic biliary ductal dilatation, the common bile duct measuring up to 1.5 cm in caliber. Pancreas: Very extensive, diffuse edema of the pancreatic parenchyma with adjacent peripancreatic fat stranding and fluid (series 3, image 20). There are areas of parenchymal hypoenhancement, particularly of the pancreatic head and neck (series 29, image 53, 37). No pancreatic ductal dilatation. No acute pancreatic fluid collection. Spleen:  Within normal limits in size and appearance. Adrenals/Urinary Tract: There is a definitively benign, fat containing left adrenal adenoma (series 9, image 27). No evidence of hydronephrosis.  Stomach/Bowel: Visualized portions within the abdomen are unremarkable. Vascular/Lymphatic: No pathologically enlarged lymph nodes identified. No abdominal aortic aneurysm demonstrated. Other:  Small  volume ascites. Musculoskeletal: No suspicious bone lesions identified. IMPRESSION: 1. Very extensive, diffuse edema of the pancreatic parenchyma with adjacent peripancreatic fat stranding and fluid. There are areas of parenchymal hypoenhancement, particularly of the pancreatic head and neck, concerning for necrosis in the setting of severe acute pancreatitis. No evidence of acute pancreatic fluid collection. 2. Severe intra and extrahepatic biliary ductal dilatation, the common bile duct measuring up to 1.5 cm in caliber. Choledocholithiasis with two gallstones within the common bile duct in the pancreatic head measuring 5-6 mm each, approximately 4 and 5 cm from the ampulla. 3. Status post cholecystectomy with a sizable gallbladder remnant in the gallbladder fossa containing multiple gallstones. 4. Small volume ascites. These results will be called to the ordering clinician or representative by the Radiologist Assistant, and communication documented in the PACS or [employer]. Electronically Signed   By: Eddie Candle M.D.   On: 08/06/2021 20:19   DG ERCP BILIARY & PANCREATIC DUCTS  Result Date: 08/08/2021 CLINICAL DATA:  Choledocholithiasis. EXAM: INTRAOPERATIVE CHOLANGIOGRAM TECHNIQUE: Cholangiographic images from the C-arm fluoroscopic device were submitted for interpretation post-operatively. Please see the procedural report for the amount of contrast and the fluoroscopy time utilized. COMPARISON:  CT Abdomen Pelvis, 08/05/2021. Abdominal ultrasound, 08/06/2021. MR abdomen, 08/06/2021. FINDINGS: Fluoroscopy Time:  7 minutes, 32 seconds Radiation Exposure Index (if provided by the fluoroscopic device): None provided. Number of Acquired Spot Images: 11. Multiple, limited oblique planar views of the RIGHT  upper quadrant demonstrating a flexible endoscope, pancreatic duct cannulation and plastic stent placement common common bile duct cannulation, cholangiogram, balloon sweep, and plastic biliary stent placement. No discrete filling defect demonstrated. IMPRESSION: Intra procedural fluoroscopic imaging for ERCP, biliary and pancreatic duct stent placements. Please see performing service dictation for complete description of intra procedural findings. Electronically Signed   By: Michaelle Birks M.D.   On: 08/08/2021 07:47   MR ABDOMEN MRCP W WO CONTAST  Result Date: 08/06/2021 CLINICAL DATA:  Pancreatitis, jaundice, nausea, vomiting, history of cholecystectomy EXAM: MRI ABDOMEN WITHOUT AND WITH CONTRAST (INCLUDING MRCP) TECHNIQUE: Multiplanar multisequence MR imaging of the abdomen was performed both before and after the administration of intravenous contrast. Heavily T2-weighted images of the biliary and pancreatic ducts were obtained, and three-dimensional MRCP images were rendered by post processing. CONTRAST:  7.51m GADAVIST GADOBUTROL 1 MMOL/ML IV SOLN COMPARISON:  CT abdomen pelvis, 08/05/2021 FINDINGS: Lower chest: No acute findings. Hepatobiliary: No mass or other parenchymal abnormality identified. Status post cholecystectomy with a sizable gallbladder remnant in the gallbladder fossa containing multiple gallstones. There are two gallstones within the common bile duct in the pancreatic head, approximately 4 and 5 cm from the ampulla, measuring 5-6 mm each (series 6, image 15). There is severe intra and extrahepatic biliary ductal dilatation, the common bile duct measuring up to 1.5 cm in caliber. Pancreas: Very extensive, diffuse edema of the pancreatic parenchyma with adjacent peripancreatic fat stranding and fluid (series 3, image 20). There are areas of parenchymal hypoenhancement, particularly of the pancreatic head and neck (series 29, image 53, 37). No pancreatic ductal dilatation. No acute pancreatic  fluid collection. Spleen:  Within normal limits in size and appearance. Adrenals/Urinary Tract: There is a definitively benign, fat containing left adrenal adenoma (series 9, image 27). No evidence of hydronephrosis. Stomach/Bowel: Visualized portions within the abdomen are unremarkable. Vascular/Lymphatic: No pathologically enlarged lymph nodes identified. No abdominal aortic aneurysm demonstrated. Other:  Small volume ascites. Musculoskeletal: No suspicious bone lesions identified. IMPRESSION: 1. Very extensive, diffuse edema of the pancreatic  parenchyma with adjacent peripancreatic fat stranding and fluid. There are areas of parenchymal hypoenhancement, particularly of the pancreatic head and neck, concerning for necrosis in the setting of severe acute pancreatitis. No evidence of acute pancreatic fluid collection. 2. Severe intra and extrahepatic biliary ductal dilatation, the common bile duct measuring up to 1.5 cm in caliber. Choledocholithiasis with two gallstones within the common bile duct in the pancreatic head measuring 5-6 mm each, approximately 4 and 5 cm from the ampulla. 3. Status post cholecystectomy with a sizable gallbladder remnant in the gallbladder fossa containing multiple gallstones. 4. Small volume ascites. These results will be called to the ordering clinician or representative by the Radiologist Assistant, and communication documented in the PACS or [employer]. Electronically Signed   By: Eddie Candle M.D.   On: 08/06/2021 20:19    Medications: I have reviewed the patient's current medications.  Assessment: Severe pancreatitis-very extensive and diffuse edema pancreatic parenchyma concerning for necrosis in pancreatic head and neck CBD stones, removed via ERCP yesterday, after sphincterotomy and pancreatic stent placement Sizable gallbladder remnant noted containing multiple stones Improvement noted in leukocytosis Hemoglobin decreased from 14.3-11.5 signifying adequate fluid  replacement(positive balance of 7,152 mL since admission, no evidence of fluid overload clinically) Appropriately downtrending LFTs, T bili/AST/ALT/ALP of one-point 3/34/73/62 Lipase level almost normal, do not recommend trending of lipase as it is no clinical significance Downtrending BUN signifying adequate hydration  Plan: Will advance diet to soft, low-fat diet. Will order abdominal x-ray to rule out ileus, if abdominal x-ray is unremarkable, okay to give laxatives as needed for constipation(possibly worsened with use of narcotics). Since patient has a sizable gallbladder remnant containing gallstones, recommend surgical evaluation, if surgery not feasible, will need outpatient referral to tertiary center like [facility] for removal of stones in gallbladder remnant. Patient currently receiving Ringer's lactate at 150 cc an hour, plan to continue the same for another 24 hours unless there is evidence of volume overload. Advised patient regarding early aggressive ambulation, out of bed to chair and to move around in the hallways multiple times a day.   Ronnette Juniper, MD 08/08/2021, 9:59 AM

## 2021-08-09 ENCOUNTER — Inpatient Hospital Stay (HOSPITAL_COMMUNITY): Payer: Medicare Other

## 2021-08-09 DIAGNOSIS — K851 Biliary acute pancreatitis without necrosis or infection: Secondary | ICD-10-CM

## 2021-08-09 LAB — COMPREHENSIVE METABOLIC PANEL
ALT: 51 U/L — ABNORMAL HIGH (ref 0–44)
AST: 21 U/L (ref 15–41)
Albumin: 2.5 g/dL — ABNORMAL LOW (ref 3.5–5.0)
Alkaline Phosphatase: 56 U/L (ref 38–126)
Anion gap: 4 — ABNORMAL LOW (ref 5–15)
BUN: 19 mg/dL (ref 8–23)
CO2: 28 mmol/L (ref 22–32)
Calcium: 8 mg/dL — ABNORMAL LOW (ref 8.9–10.3)
Chloride: 107 mmol/L (ref 98–111)
Creatinine, Ser: 0.67 mg/dL (ref 0.61–1.24)
GFR, Estimated: >60 mL/min (ref >60)
Glucose, Bld: 87 mg/dL (ref 70–99)
Potassium: 3.6 mmol/L (ref 3.5–5.1)
Sodium: 139 mmol/L (ref 135–145)
Total Bilirubin: 1.2 mg/dL (ref 0.3–1.2)
Total Protein: 5.1 g/dL — ABNORMAL LOW (ref 6.5–8.1)

## 2021-08-09 LAB — CBC
HCT: 32.6 % — ABNORMAL LOW (ref 39.0–52.0)
Hemoglobin: 10.9 g/dL — ABNORMAL LOW (ref 13.0–17.0)
MCH: 33.3 pg (ref 26.0–34.0)
MCHC: 33.4 g/dL (ref 30.0–36.0)
MCV: 99.7 fL (ref 80.0–100.0)
Platelets: 93 10*3/uL — ABNORMAL LOW (ref 150–400)
RBC: 3.27 MIL/uL — ABNORMAL LOW (ref 4.22–5.81)
RDW: 13.5 % (ref 11.5–15.5)
WBC: 10.7 10*3/uL — ABNORMAL HIGH (ref 4.0–10.5)
nRBC: 0 % (ref 0.0–0.2)

## 2021-08-09 MED ORDER — TECHNETIUM TC 99M MEBROFENIN IV KIT
5.3000 | PACK | Freq: Once | INTRAVENOUS | Status: AC
  Administered 2021-08-09: 5.3 via INTRAVENOUS

## 2021-08-09 NOTE — Progress Notes (Signed)
Progress Note  2 Days Post-Op  Subjective: Patient reports some mild abdominal pain this AM. Had a very small BM. Denies nausea. ASL interpreter used.   Objective: Vital signs in last 24 hours: Temp:  [98.4 F (36.9 C)-99.4 F (37.4 C)] 98.4 F (36.9 C) (08/31 0535) Pulse Rate:  [51-68] 51 (08/31 0535) Resp:  [15-17] 16 (08/31 0535) BP: (99-110)/(65-72) 105/66 (08/31 0535) SpO2:  [94 %-95 %] 94 % (08/31 0535)    Intake/Output from previous day: 08/30 0701 - 08/31 0700 In: 4785.1 [P.O.:990; I.V.:3599; IV Piggyback:196.1] Out: 1100 [Urine:1100] Intake/Output this shift: No intake/output data recorded.  PE: General: pleasant, WD, WN male who is laying in bed in NAD Heart: regular, rate, and rhythm.   Lungs: CTAB, no wheezes, rhonchi, or rales noted.  Respiratory effort nonlabored Abd: soft, mild pain in epigastric abdomen, ND, +BS, RUQ surgical scar    Lab Results:  Recent Labs    08/08/21 0419 08/09/21 0434  WBC 14.4* 10.7*  HGB 11.5* 10.9*  HCT 35.3* 32.6*  PLT 82* 93*   BMET Recent Labs    08/08/21 0419 08/09/21 0434  NA 138 139  K 4.1 3.6  CL 107 107  CO2 27 28  GLUCOSE 110* 87  BUN 21 19  CREATININE 0.78 0.67  CALCIUM 8.2* 8.0*   PT/INR No results for input(s): LABPROT, INR in the last 72 hours. CMP     Component Value Date/Time   NA 139 08/09/2021 0434   K 3.6 08/09/2021 0434   CL 107 08/09/2021 0434   CO2 28 08/09/2021 0434   GLUCOSE 87 08/09/2021 0434   BUN 19 08/09/2021 0434   CREATININE 0.67 08/09/2021 0434   CALCIUM 8.0 (L) 08/09/2021 0434   PROT 5.1 (L) 08/09/2021 0434   ALBUMIN 2.5 (L) 08/09/2021 0434   AST 21 08/09/2021 0434   ALT 51 (H) 08/09/2021 0434   ALKPHOS 56 08/09/2021 0434   BILITOT 1.2 08/09/2021 0434   GFRNONAA >60 08/09/2021 0434   Lipase     Component Value Date/Time   LIPASE 73 (H) 08/08/2021 0419       Studies/Results: DG Abd 1 View  Result Date: 08/08/2021 CLINICAL DATA:  Emesis, abdominal pain.  EXAM: ABDOMEN - 1 VIEW COMPARISON:  MRI abdomen/MRCP from August 06, 2021. FINDINGS: Supine abdominal radiograph with signs of moderate colonic distension of the rectosigmoid colon up to 7 cm. Stool and gas mixed throughout the remainder of the colon. Scattered gas-filled loops of small bowel. Signs of stent placement into the pancreatic duct with similar appearance. Lung bases are clear. On limited assessment there is no acute skeletal process. Spinal degenerative changes. IMPRESSION: Gaseous distension of bowel loops particularly rectosigmoid colon since previous imaging. Findings may reflect developing global ileus. Distension of the rectosigmoid near 7 cm warrants close attention on follow-up. Correlate with abdominal symptoms. Signs of stent placement into the pancreatic duct. These results will be called to the ordering clinician or representative by the Radiologist Assistant, and communication documented in the PACS or [employer]. Electronically Signed   By: Zetta Bills M.D.   On: 08/08/2021 14:48   DG ERCP BILIARY & PANCREATIC DUCTS  Result Date: 08/08/2021 CLINICAL DATA:  Choledocholithiasis. EXAM: INTRAOPERATIVE CHOLANGIOGRAM TECHNIQUE: Cholangiographic images from the C-arm fluoroscopic device were submitted for interpretation post-operatively. Please see the procedural report for the amount of contrast and the fluoroscopy time utilized. COMPARISON:  CT Abdomen Pelvis, 08/05/2021. Abdominal ultrasound, 08/06/2021. MR abdomen, 08/06/2021. FINDINGS: Fluoroscopy Time:  7 minutes,  32 seconds Radiation Exposure Index (if provided by the fluoroscopic device): None provided. Number of Acquired Spot Images: 11. Multiple, limited oblique planar views of the RIGHT upper quadrant demonstrating a flexible endoscope, pancreatic duct cannulation and plastic stent placement common common bile duct cannulation, cholangiogram, balloon sweep, and plastic biliary stent placement. No discrete filling defect  demonstrated. IMPRESSION: Intra procedural fluoroscopic imaging for ERCP, biliary and pancreatic duct stent placements. Please see performing service dictation for complete description of intra procedural findings. Electronically Signed   By: Michaelle Birks M.D.   On: 08/08/2021 07:47    Anti-infectives: Anti-infectives (From admission, onward)    Start     Dose/Rate Route Frequency Ordered Stop   08/08/21 1800  piperacillin-tazobactam (ZOSYN) IVPB 3.375 g        3.375 g 12.5 mL/hr over 240 Minutes Intravenous Every 8 hours 08/08/21 1117     08/08/21 0200  meropenem (MERREM) 1 g in sodium chloride 0.9 % 100 mL IVPB  Status:  Discontinued        1 g 200 mL/hr over 30 Minutes Intravenous Every 8 hours 08/07/21 1605 08/08/21 1115   08/07/21 1530  meropenem (MERREM) 1 g in sodium chloride 0.9 % 100 mL IVPB        1 g 200 mL/hr over 30 Minutes Intravenous  Once 08/07/21 1526 08/07/21 1913        Assessment/Plan Biliary pancreatitis S/P open cholecystectomy 30 years ago with remnant gallbladder - lipase >1600 on admit and was downtrending - 93 yesterday - Tbili 3.9 on admit and down to 1.2 today - US/CT showed retained gallstones in gallbladder fossa but no mention of remnant gallbladder - MRCP showed sizable gallbladder remnant containing multiple gallstones - s/p ERCP 8/29 - after further consideration would recommend HIDA today to better evaluate remnant gallbladder. Will make recommendations on follow up once this is done. No indication for urgent/emergent surgical intervention   FEN: NPO for HIDA, IVF per primary  VTE: LMWH ID: merrem 8/29>8/30; Zosyn 8/30>>   HTN Deafness   LOS: 4 days    Norm Parcel, [facility] 08/09/2021, 10:57 AM Please see Amion for pager number during day hours 7:00am-4:30pm

## 2021-08-09 NOTE — Progress Notes (Signed)
PROGRESS NOTE    Hunter Ward  [id number] DOB: 30-Jun-1951 DOA: 08/05/2021 PCP: Gaynelle Arabian, MD    Brief Narrative:  70 year old with history of hypertension, history of prior pancreatitis and cholecystectomy 30 years ago presented to the emergency room with nausea vomiting abdominal pain along with constipation.  Stable in the ER.  Found to have lipase more than 1600.  CT scan of the abdomen showed acute pancreatitis with retained stone in the gallbladder fossa, common bile duct 9 mm.  LFT elevated.  Metubine 3.9.   Assessment & Plan:   Principal Problem:   Acute pancreatitis Active Problems:   HTN (hypertension)   Serum total bilirubin elevated   Transaminitis  Acute severe gallstone parotitis with necrosis, transaminitis and choledocholithiasis: Currently n.p.o., needing IV pain medications that were provided with IV Dilaudid.  Lipase normalized. Status post ERCP and biliary stent placement, followed by gastroenterology.  Bilirubin and LFTs normalized. Followed by surgery for possible remnant of gallbladder fossa and stone, HIDA scan today. Continue pain medications, adequate hydration. Remains on IV Zosyn due to elevated WBC count and necrotizing pancreatitis.  Essential hypertension blood pressure stable on current regimen.  Constipation: On Colace and MiraLAX.    DVT prophylaxis: enoxaparin (LOVENOX) injection 40 mg Start: 08/05/21 2230   Code Status: Full code Family Communication: Sister and niece at the bedside Disposition Plan: Status is: Inpatient  Remains inpatient appropriate because:IV treatments appropriate due to intensity of illness or inability to take PO and Inpatient level of care appropriate due to severity of illness  Dispo: The patient is from: Home              Anticipated d/c is to: Home              Patient currently is not medically stable to d/c.   Difficult to place patient No         Consultants:   Gastroenterology Surgery  Procedures:  ERCP 8/29  Antimicrobials:  IV Zosyn 8/27---   Subjective: Patient seen and examined.  Sister and niece at the bedside.  Came back from HIDA scan.  Sister helped with translation and sign language. Patient was in severe abdominal pain since not receiving any pain medication since middle of the night.  Denied any nausea vomiting. He was also complaining of flash of light in front of his eyes.  He has remained n.p.o. after surgical orders of HIDA scan.  Objective: Vitals:   08/08/21 1737 08/08/21 2126 08/09/21 0535 08/09/21 1328  BP: 110/70 99/72 105/66 117/62  Pulse: 68 61 (!) 51 61  Resp: '17 16 16 18  '$ Temp: 99.4 F (37.4 C) 99.3 F (37.4 C) 98.4 F (36.9 C) 98 F (36.7 C)  TempSrc: Oral Oral Oral Oral  SpO2: 94% 95% 94% 97%  Weight:      Height:        Intake/Output Summary (Last 24 hours) at 08/09/2021 1343 Last data filed at 08/09/2021 0900 Gross per 24 hour  Intake 4785.08 ml  Output 1100 ml  Net 3685.08 ml   Filed Weights   08/05/21 1621 08/07/21 1406  Weight: 73.9 kg 73.9 kg    Examination:  General exam: Appears calm but in mild distress with pain just received pain medication. Respiratory system: Clear to auscultation. Respiratory effort normal. Cardiovascular system: S1 & S2 heard, RRR.  No edema.   Gastrointestinal system: Soft.  Mild diffuse tenderness all over.  Bowel sounds present.   Central nervous system: Alert and oriented. No  focal neurological deficits. Extremities: Symmetric 5 x 5 power. Skin: No rashes, lesions or ulcers Psychiatry: Judgement and insight appear normal.  Anxious.    Data Reviewed: I have personally reviewed following labs and imaging studies  CBC: Recent Labs  Lab 08/05/21 1632 08/06/21 0459 08/07/21 0851 08/08/21 0419 08/09/21 0434  WBC 13.2* 19.8* 23.6* 14.4* 10.7*  NEUTROABS 12.3*  --   --   --   --   HGB 14.2 14.9 14.3 11.5* 10.9*  HCT 42.0 43.9 44.1 35.3* 32.6*  MCV  97.7 98.0 102.1* 101.4* 99.7  PLT 153 144* 108* 82* 93*   Basic Metabolic Panel: Recent Labs  Lab 08/05/21 1632 08/06/21 0459 08/07/21 0420 08/08/21 0419 08/09/21 0434  NA 138 137 137 138 139  K 4.0 4.2 4.2 4.1 3.6  CL 101 104 105 107 107  CO2 '24 25 26 27 28  '$ GLUCOSE 147* 129* 101* 110* 87  BUN '17 22 23 21 19  '$ CREATININE 0.99 0.86 0.74 0.78 0.67  CALCIUM 9.8 9.1 8.7* 8.2* 8.0*   GFR: Estimated Creatinine Clearance: 88.5 mL/min (by C-G formula based on SCr of 0.67 mg/dL). Liver Function Tests: Recent Labs  Lab 08/05/21 1632 08/06/21 0459 08/07/21 0420 08/08/21 0419 08/09/21 0434  AST 279* 220* 61* 34 21  ALT 277* 239* 128* 73* 51*  ALKPHOS 129* 123 79 62 56  BILITOT 3.9* 3.6* 1.9* 1.3* 1.2  PROT 7.0 6.6 5.8* 5.1* 5.1*  ALBUMIN 4.1 3.9 3.1* 2.6* 2.5*   Recent Labs  Lab 08/05/21 1632 08/06/21 0459 08/07/21 0420 08/08/21 0419  LIPASE 1,649* 771* 177* 73*   No results for input(s): AMMONIA in the last 168 hours. Coagulation Profile: No results for input(s): INR, PROTIME in the last 168 hours. Cardiac Enzymes: No results for input(s): CKTOTAL, CKMB, CKMBINDEX, TROPONINI in the last 168 hours. BNP (last 3 results) No results for input(s): PROBNP in the last 8760 hours. HbA1C: No results for input(s): HGBA1C in the last 72 hours. CBG: No results for input(s): GLUCAP in the last 168 hours. Lipid Profile: No results for input(s): CHOL, HDL, LDLCALC, TRIG, CHOLHDL, LDLDIRECT in the last 72 hours. Thyroid Function Tests: No results for input(s): TSH, T4TOTAL, FREET4, T3FREE, THYROIDAB in the last 72 hours. Anemia Panel: No results for input(s): VITAMINB12, FOLATE, FERRITIN, TIBC, IRON, RETICCTPCT in the last 72 hours. Sepsis Labs: Recent Labs  Lab 08/07/21 0851  PROCALCITON 0.61    Recent Results (from the past 240 hour(s))  Resp Panel by RT-PCR (Flu A&B, Covid) Nasopharyngeal Swab     Status: None   Collection Time: 08/05/21  6:46 PM   Specimen:  Nasopharyngeal Swab; Nasopharyngeal(NP) swabs in vial transport medium  Result Value Ref Range Status   SARS Coronavirus 2 by RT PCR NEGATIVE NEGATIVE Final    Comment: (NOTE) SARS-CoV-2 target nucleic acids are NOT DETECTED.  The SARS-CoV-2 RNA is generally detectable in upper respiratory specimens during the acute phase of infection. The lowest concentration of SARS-CoV-2 viral copies this assay can detect is 138 copies/mL. A negative result does not preclude SARS-Cov-2 infection and should not be used as the sole basis for treatment or other patient management decisions. A negative result may occur with  improper specimen collection/handling, submission of specimen other than nasopharyngeal swab, presence of viral mutation(s) within the areas targeted by this assay, and inadequate number of viral copies(<138 copies/mL). A negative result must be combined with clinical observations, patient history, and epidemiological information. The expected result is Negative.  Fact Sheet  for Patients:  EntrepreneurPulse.com.au  Fact Sheet for Healthcare Providers:  IncredibleEmployment.be  This test is no t yet approved or cleared by the [place] FDA and  has been authorized for detection and/or diagnosis of SARS-CoV-2 by FDA under an Emergency Use Authorization (EUA). This EUA will remain  in effect (meaning this test can be used) for the duration of the COVID-19 declaration under Section 564(b)(1) of the Act, 21 U.S.C.section 360bbb-3(b)(1), unless the authorization is terminated  or revoked sooner.       Influenza A by PCR NEGATIVE NEGATIVE Final   Influenza B by PCR NEGATIVE NEGATIVE Final    Comment: (NOTE) The Xpert Xpress SARS-CoV-2/FLU/RSV plus assay is intended as an aid in the diagnosis of influenza from Nasopharyngeal swab specimens and should not be used as a sole basis for treatment. Nasal washings and aspirates are unacceptable for  Xpert Xpress SARS-CoV-2/FLU/RSV testing.  Fact Sheet for Patients: EntrepreneurPulse.com.au  Fact Sheet for Healthcare Providers: IncredibleEmployment.be  This test is not yet approved or cleared by the [place] FDA and has been authorized for detection and/or diagnosis of SARS-CoV-2 by FDA under an Emergency Use Authorization (EUA). This EUA will remain in effect (meaning this test can be used) for the duration of the COVID-19 declaration under Section 564(b)(1) of the Act, 21 U.S.C. section 360bbb-3(b)(1), unless the authorization is terminated or revoked.  Performed at [facility], [address], [place], [place] [address]   Culture, blood (routine x 2)     Status: None (Preliminary result)   Collection Time: 08/07/21  6:57 PM   Specimen: BLOOD  Result Value Ref Range Status   Specimen Description   Final    BLOOD LEFT ANTECUBITAL Performed at [facility] [address] [place], [place] [address]    Special Requests   Final    BOTTLES DRAWN AEROBIC ONLY Blood Culture adequate volume Performed at [facility] [address] [place], [place] [address]    Culture   Final    NO GROWTH 1 DAY Performed at [facility], [place] [address] [place] [address], [place], [place] [address]    Report Status PENDING  Incomplete  Culture, blood (routine x 2)     Status: None (Preliminary result)   Collection Time: 08/07/21  6:57 PM   Specimen: BLOOD  Result Value Ref Range Status   Specimen Description   Final    BLOOD RIGHT ANTECUBITAL Performed at [facility] [address] [place], [place] [address]    Special Requests   Final    BOTTLES DRAWN AEROBIC ONLY Blood Culture adequate volume Performed at [facility] [address] [place], [place] [address]    Culture   Final    NO GROWTH 1 DAY Performed at [facility], [place] [address]  [place], [place] [address]    Report Status PENDING  Incomplete         Radiology Studies: DG Abd 1 View  Result Date: 08/08/2021 CLINICAL DATA:  Emesis, abdominal pain. EXAM: ABDOMEN - 1 VIEW COMPARISON:  MRI abdomen/MRCP from August 06, 2021. FINDINGS: Supine abdominal radiograph with signs of moderate colonic distension of the rectosigmoid colon up to 7 cm. Stool and gas mixed throughout the remainder of the colon. Scattered gas-filled loops of small bowel. Signs of stent placement into the pancreatic duct with similar appearance. Lung bases are clear. On limited assessment there is no acute skeletal process. Spinal degenerative changes. IMPRESSION: Gaseous  distension of bowel loops particularly rectosigmoid colon since previous imaging. Findings may reflect developing global ileus. Distension of the rectosigmoid near 7 cm warrants close attention on follow-up. Correlate with abdominal symptoms. Signs of stent placement into the pancreatic duct. These results will be called to the ordering clinician or representative by the Radiologist Assistant, and communication documented in the PACS or [employer]. Electronically Signed   By: Zetta Bills M.D.   On: 08/08/2021 14:48   NM Hepatobiliary Liver Func  Result Date: 08/09/2021 CLINICAL DATA:  Choledocholithiasis. Prior cholecystectomy with small gallbladder remnant. EXAM: NUCLEAR MEDICINE HEPATOBILIARY IMAGING TECHNIQUE: Sequential images of the abdomen were obtained out to 60 minutes following intravenous administration of radiopharmaceutical. RADIOPHARMACEUTICALS:  5.3 mCi Tc-28m Choletec IV COMPARISON:  3 CT 08/06/2021, MRI 08/06/2021, ERCP 08/07/2021 FINDINGS: Prompt clearance radiotracer from the blood pool and homogeneous uptake in liver. Counts are evident in the common bile duct and small bowel by 20 minutes. No evidence of biliary obstruction. Small cystic duct remnant/gallbladder remnant is noted. No evidence of biliary leak. IMPRESSION:  1. Patent common bile duct.  No evidence of biliary obstruction. 2. Small cystic duct remnant/gallbladder remnant noted. Electronically Signed   By: SSuzy BouchardM.D.   On: 08/09/2021 11:37   DG ERCP BILIARY & PANCREATIC DUCTS  Result Date: 08/08/2021 CLINICAL DATA:  Choledocholithiasis. EXAM: INTRAOPERATIVE CHOLANGIOGRAM TECHNIQUE: Cholangiographic images from the C-arm fluoroscopic device were submitted for interpretation post-operatively. Please see the procedural report for the amount of contrast and the fluoroscopy time utilized. COMPARISON:  CT Abdomen Pelvis, 08/05/2021. Abdominal ultrasound, 08/06/2021. MR abdomen, 08/06/2021. FINDINGS: Fluoroscopy Time:  7 minutes, 32 seconds Radiation Exposure Index (if provided by the fluoroscopic device): None provided. Number of Acquired Spot Images: 11. Multiple, limited oblique planar views of the RIGHT upper quadrant demonstrating a flexible endoscope, pancreatic duct cannulation and plastic stent placement common common bile duct cannulation, cholangiogram, balloon sweep, and plastic biliary stent placement. No discrete filling defect demonstrated. IMPRESSION: Intra procedural fluoroscopic imaging for ERCP, biliary and pancreatic duct stent placements. Please see performing service dictation for complete description of intra procedural findings. Electronically Signed   By: JMichaelle BirksM.D.   On: 08/08/2021 07:47        Scheduled Meds:  bisacodyl  10 mg Rectal Once   docusate sodium  100 mg Oral BID   enoxaparin (LOVENOX) injection  40 mg Subcutaneous Q24H   ondansetron (ZOFRAN) IV  4 mg Intravenous Once   Continuous Infusions:  lactated ringers 150 mL/hr at 08/09/21 0653   piperacillin-tazobactam (ZOSYN)  IV 3.375 g (08/09/21 1124)     LOS: 4 days    Time spent: 30 minutes    KBarb Merino MD [facility] Pager [phone]

## 2021-08-09 NOTE — Progress Notes (Signed)
Subjective: Patient was seen and examined at bedside in presence of his niece and sister. His niece helped with sign language interpretation. Patient had a large bowel movement yesterday. He had tomato soup, cream of wheat and potatoes yesterday, thereafter developed worsening upper abdominal pain. He had a HIDA scan today, has been n.p.o. post midnight and is requesting for his diet to be advanced.  Objective: Vital signs in last 24 hours: Temp:  [98.4 F (36.9 C)-99.4 F (37.4 C)] 98.4 F (36.9 C) (08/31 0535) Pulse Rate:  [51-68] 51 (08/31 0535) Resp:  [15-17] 16 (08/31 0535) BP: (99-110)/(65-72) 105/66 (08/31 0535) SpO2:  [94 %-95 %] 94 % (08/31 0535) Weight change:     PE: Deaf, mild pallor, no icterus GENERAL: Not in distress, moist mucous membranes  ABDOMEN: Mild generalized tenderness, more prominent in upper abdomen, sluggish bowel sounds EXTREMITIES: No deformity  Lab Results: Results for orders placed or performed during the hospital encounter of 08/05/21 (from the past 48 hour(s))  Culture, blood (routine x 2)     Status: None (Preliminary result)   Collection Time: 08/07/21  6:57 PM   Specimen: BLOOD  Result Value Ref Range   Specimen Description      BLOOD LEFT ANTECUBITAL Performed at [facility], [place] [address] [place] [address], [place], [place] [address]    Special Requests      BOTTLES DRAWN AEROBIC ONLY Blood Culture adequate volume Performed at [facility] [address], [place], [place] [address]    Culture      NO GROWTH 1 DAY Performed at [facility] [address], [place], [place] [address]    Report Status PENDING   Culture, blood (routine x 2)     Status: None (Preliminary result)   Collection Time: 08/07/21  6:57 PM   Specimen: BLOOD  Result Value Ref Range   Specimen Description      BLOOD RIGHT ANTECUBITAL Performed at [facility], [place] [address] [place], [place] [address]     Special Requests      BOTTLES DRAWN AEROBIC ONLY Blood Culture adequate volume Performed at [facility] [address] [place], [place] [address]    Culture      NO GROWTH 1 DAY Performed at [facility] [address], [place], [place] [address]    Report Status PENDING   Comprehensive metabolic panel     Status: Abnormal   Collection Time: 08/08/21  4:19 AM  Result Value Ref Range   Sodium 138 135 - 145 mmol/L   Potassium 4.1 3.5 - 5.1 mmol/L   Chloride 107 98 - 111 mmol/L   CO2 27 22 - 32 mmol/L   Glucose, Bld 110 (H) 70 - 99 mg/dL    Comment: Glucose reference range applies only to samples taken after fasting for at least 8 hours.   BUN 21 8 - 23 mg/dL   Creatinine, Ser 0.78 0.61 - 1.24 mg/dL   Calcium 8.2 (L) 8.9 - 10.3 mg/dL   Total Protein 5.1 (L) 6.5 - 8.1 g/dL   Albumin 2.6 (L) 3.5 - 5.0 g/dL   AST 34 15 - 41 U/L   ALT 73 (H) 0 - 44 U/L   Alkaline Phosphatase 62 38 - 126 U/L   Total Bilirubin 1.3 (H) 0.3 - 1.2 mg/dL   GFR, Estimated >60 >60 mL/min    Comment: (NOTE) Calculated using the CKD-EPI Creatinine Equation (2021)    Anion gap  4 (L) 5 - 15    Comment: Performed at [facility], [place] [address], [place], [place] [address]  Lipase, blood     Status: Abnormal   Collection Time: 08/08/21  4:19 AM  Result Value Ref Range   Lipase 73 (H) 11 - 51 U/L    Comment: Performed at [facility], [place] [address] [place], [place] [address]  CBC     Status: Abnormal   Collection Time: 08/08/21  4:19 AM  Result Value Ref Range   WBC 14.4 (H) 4.0 - 10.5 K/uL   RBC 3.48 (L) 4.22 - 5.81 MIL/uL   Hemoglobin 11.5 (L) 13.0 - 17.0 g/dL   HCT 35.3 (L) 39.0 - 52.0 %   MCV 101.4 (H) 80.0 - 100.0 fL   MCH 33.0 26.0 - 34.0 pg   MCHC 32.6 30.0 - 36.0 g/dL   RDW 13.8 11.5 - 15.5 %   Platelets 82 (L) 150 - 400 K/uL    Comment: SPECIMEN CHECKED FOR CLOTS Immature Platelet Fraction may be clinically indicated,  consider ordering this additional test [id number] CONSISTENT WITH PREVIOUS RESULT REPEATED TO VERIFY    nRBC 0.0 0.0 - 0.2 %    Comment: Performed at [facility], [place] [address] [place], [place] [address]  CBC     Status: Abnormal   Collection Time: 08/09/21  4:34 AM  Result Value Ref Range   WBC 10.7 (H) 4.0 - 10.5 K/uL   RBC 3.27 (L) 4.22 - 5.81 MIL/uL   Hemoglobin 10.9 (L) 13.0 - 17.0 g/dL   HCT 32.6 (L) 39.0 - 52.0 %   MCV 99.7 80.0 - 100.0 fL   MCH 33.3 26.0 - 34.0 pg   MCHC 33.4 30.0 - 36.0 g/dL   RDW 13.5 11.5 - 15.5 %   Platelets 93 (L) 150 - 400 K/uL    Comment: SPECIMEN CHECKED FOR CLOTS Immature Platelet Fraction may be clinically indicated, consider ordering this additional test [id number] CONSISTENT WITH PREVIOUS RESULT REPEATED TO VERIFY    nRBC 0.0 0.0 - 0.2 %    Comment: Performed at [facility], [place] [address], [place], [place] [address]  Comprehensive metabolic panel     Status: Abnormal   Collection Time: 08/09/21  4:34 AM  Result Value Ref Range   Sodium 139 135 - 145 mmol/L   Potassium 3.6 3.5 - 5.1 mmol/L   Chloride 107 98 - 111 mmol/L   CO2 28 22 - 32 mmol/L   Glucose, Bld 87 70 - 99 mg/dL    Comment: Glucose reference range applies only to samples taken after fasting for at least 8 hours.   BUN 19 8 - 23 mg/dL   Creatinine, Ser 0.67 0.61 - 1.24 mg/dL   Calcium 8.0 (L) 8.9 - 10.3 mg/dL   Total Protein 5.1 (L) 6.5 - 8.1 g/dL   Albumin 2.5 (L) 3.5 - 5.0 g/dL   AST 21 15 - 41 U/L   ALT 51 (H) 0 - 44 U/L   Alkaline Phosphatase 56 38 - 126 U/L   Total Bilirubin 1.2 0.3 - 1.2 mg/dL   GFR, Estimated >60 >60 mL/min    Comment: (NOTE) Calculated using the CKD-EPI Creatinine Equation (2021)    Anion gap 4 (L) 5 - 15    Comment: Performed at [facility], [place] [address] [place], [place] [address]    Studies/Results: DG Abd 1 View  Result Date: 08/08/2021 CLINICAL DATA:  Emesis, abdominal  pain. EXAM: ABDOMEN - 1 VIEW COMPARISON:  MRI abdomen/MRCP from August 06, 2021. FINDINGS: Supine abdominal radiograph with signs of moderate colonic distension of the rectosigmoid colon up to 7 cm. Stool and gas mixed throughout the remainder of the colon. Scattered gas-filled loops of small bowel. Signs of stent placement into the pancreatic duct with similar appearance. Lung bases are clear. On limited assessment there is no acute skeletal process. Spinal degenerative changes. IMPRESSION: Gaseous distension of bowel loops particularly rectosigmoid colon since previous imaging. Findings may reflect developing global ileus. Distension of the rectosigmoid near 7 cm warrants close attention on follow-up. Correlate with abdominal symptoms. Signs of stent placement into the pancreatic duct. These results will be called to the ordering clinician or representative by the Radiologist Assistant, and communication documented in the PACS or [employer]. Electronically Signed   By: Zetta Bills M.D.   On: 08/08/2021 14:48   NM Hepatobiliary Liver Func  Result Date: 08/09/2021 CLINICAL DATA:  Choledocholithiasis. Prior cholecystectomy with small gallbladder remnant. EXAM: NUCLEAR MEDICINE HEPATOBILIARY IMAGING TECHNIQUE: Sequential images of the abdomen were obtained out to 60 minutes following intravenous administration of radiopharmaceutical. RADIOPHARMACEUTICALS:  5.3 mCi Tc-56m Choletec IV COMPARISON:  3 CT 08/06/2021, MRI 08/06/2021, ERCP 08/07/2021 FINDINGS: Prompt clearance radiotracer from the blood pool and homogeneous uptake in liver. Counts are evident in the common bile duct and small bowel by 20 minutes. No evidence of biliary obstruction. Small cystic duct remnant/gallbladder remnant is noted. No evidence of biliary leak. IMPRESSION: 1. Patent common bile duct.  No evidence of biliary obstruction. 2. Small cystic duct remnant/gallbladder remnant noted. Electronically Signed   By: SSuzy BouchardM.D.    On: 08/09/2021 11:37   DG ERCP BILIARY & PANCREATIC DUCTS  Result Date: 08/08/2021 CLINICAL DATA:  Choledocholithiasis. EXAM: INTRAOPERATIVE CHOLANGIOGRAM TECHNIQUE: Cholangiographic images from the C-arm fluoroscopic device were submitted for interpretation post-operatively. Please see the procedural report for the amount of contrast and the fluoroscopy time utilized. COMPARISON:  CT Abdomen Pelvis, 08/05/2021. Abdominal ultrasound, 08/06/2021. MR abdomen, 08/06/2021. FINDINGS: Fluoroscopy Time:  7 minutes, 32 seconds Radiation Exposure Index (if provided by the fluoroscopic device): None provided. Number of Acquired Spot Images: 11. Multiple, limited oblique planar views of the RIGHT upper quadrant demonstrating a flexible endoscope, pancreatic duct cannulation and plastic stent placement common common bile duct cannulation, cholangiogram, balloon sweep, and plastic biliary stent placement. No discrete filling defect demonstrated. IMPRESSION: Intra procedural fluoroscopic imaging for ERCP, biliary and pancreatic duct stent placements. Please see performing service dictation for complete description of intra procedural findings. Electronically Signed   By: JMichaelle BirksM.D.   On: 08/08/2021 07:47    Medications: I have reviewed the patient's current medications.  Assessment: Severe biliary pancreatitis, status post ERCP with 2 CBD stones removal and plastic pancreatic stent placement HIDA scan showed patent common bile duct, no evidence of biliary obstruction, small cystic duct remnant/gallbladder remnant-previous CAT scan showed stones within gallbladder remnant  Normal LFTs Improvement in leukocytosis Mild thrombocytopenia  Plan: Patient currently n.p.o. post HIDA scan, recommend resuming low-fat diet if okay with surgery.  Continue lactated Ringer's at 150 mL/h for another 24 hours, at least ,until patient is able to tolerate solids to maintain nutritional needs.  Discussed with patient  and family members that with narcotics ,constipation need worsen, recommend getting out of bed to chair and early and aggressive ambulation when pain is adequately controlled.  Empirically on IV Zosyn, as MRI showed concerns for necrosis  of pancreatic head and neck in setting of acute severe pancreatitis  Eventually patient will require repeat imaging with IV contrast in a few weeks for follow-up of severe pancreatitis with possible necrosis.  Patient noted to have stones within gallbladder remnant, surgery on board, if surgical team do not recommended removal of stones in gallbladder remnant, we will arrange for outpatient follow-up at [facility] with advanced GI for the same reason.  Ronnette Juniper, MD 08/09/2021, 12:37 PM

## 2021-08-09 NOTE — Plan of Care (Signed)

## 2021-08-09 NOTE — Progress Notes (Signed)
Mobility Specialist - Cancellation Note  Cancellation: Pt off unit and in procedure. Will check back as schedule permits.   [place] Specialist Acute Rehabilitation Services Phone: [phone] 08/09/21, 11:52 AM

## 2021-08-10 MED ORDER — PANTOPRAZOLE SODIUM 40 MG IV SOLR
40.0000 mg | Freq: Two times a day (BID) | INTRAVENOUS | Status: DC
  Administered 2021-08-10 – 2021-08-11 (×3): 40 mg via INTRAVENOUS
  Filled 2021-08-10 (×3): qty 40

## 2021-08-10 MED ORDER — ALUM & MAG HYDROXIDE-SIMETH 200-200-20 MG/5ML PO SUSP
30.0000 mL | Freq: Four times a day (QID) | ORAL | Status: DC | PRN
  Administered 2021-08-10: 30 mL via ORAL
  Filled 2021-08-10: qty 30

## 2021-08-10 MED ORDER — HYDROMORPHONE HCL 1 MG/ML IJ SOLN
0.5000 mg | INTRAMUSCULAR | Status: DC | PRN
Start: 2021-08-10 — End: 2021-08-12
  Administered 2021-08-10 – 2021-08-11 (×5): 0.5 mg via INTRAVENOUS
  Filled 2021-08-10 (×5): qty 0.5

## 2021-08-10 MED ORDER — POLYETHYLENE GLYCOL 3350 17 G PO PACK
17.0000 g | PACK | Freq: Every day | ORAL | Status: DC
  Administered 2021-08-11: 17 g via ORAL
  Filled 2021-08-10: qty 1

## 2021-08-10 NOTE — Progress Notes (Signed)
Progress Note  3 Days Post-Op  Subjective: Patient reports some abdominal pain still. He has not had a BM. He feels full and bloated. He was up walking with PT this AM.   Objective: Vital signs in last 24 hours: Temp:  [98 F (36.7 C)-99.1 F (37.3 C)] 98.3 F (36.8 C) (09/01 0643) Pulse Rate:  [58-67] 58 (09/01 0643) Resp:  [18] 18 (09/01 0643) BP: (116-120)/(62-76) 116/63 (09/01 0643) SpO2:  [96 %-97 %] 97 % (09/01 0643)    Intake/Output from previous day: 08/31 0701 - 09/01 0700 In: 4162.5 [P.O.:840; I.V.:3226.5; IV Piggyback:96] Out: 2100 [Urine:2100] Intake/Output this shift: Total I/O In: 120 [P.O.:120] Out: 300 [Urine:300]  PE: General: pleasant, WD, WN male who is laying in bed in NAD Heart: regular, rate, and rhythm.   Lungs: CTAB, no wheezes, rhonchi, or rales noted.  Respiratory effort nonlabored Abd: soft, mild pain in epigastric abdomen, ND, +BS, RUQ surgical scar   Lab Results:  Recent Labs    08/08/21 0419 08/09/21 0434  WBC 14.4* 10.7*  HGB 11.5* 10.9*  HCT 35.3* 32.6*  PLT 82* 93*   BMET Recent Labs    08/08/21 0419 08/09/21 0434  NA 138 139  K 4.1 3.6  CL 107 107  CO2 27 28  GLUCOSE 110* 87  BUN 21 19  CREATININE 0.78 0.67  CALCIUM 8.2* 8.0*   PT/INR No results for input(s): LABPROT, INR in the last 72 hours. CMP     Component Value Date/Time   NA 139 08/09/2021 0434   K 3.6 08/09/2021 0434   CL 107 08/09/2021 0434   CO2 28 08/09/2021 0434   GLUCOSE 87 08/09/2021 0434   BUN 19 08/09/2021 0434   CREATININE 0.67 08/09/2021 0434   CALCIUM 8.0 (L) 08/09/2021 0434   PROT 5.1 (L) 08/09/2021 0434   ALBUMIN 2.5 (L) 08/09/2021 0434   AST 21 08/09/2021 0434   ALT 51 (H) 08/09/2021 0434   ALKPHOS 56 08/09/2021 0434   BILITOT 1.2 08/09/2021 0434   GFRNONAA >60 08/09/2021 0434   Lipase     Component Value Date/Time   LIPASE 73 (H) 08/08/2021 0419       Studies/Results: DG Abd 1 View  Result Date: 08/08/2021 CLINICAL  DATA:  Emesis, abdominal pain. EXAM: ABDOMEN - 1 VIEW COMPARISON:  MRI abdomen/MRCP from August 06, 2021. FINDINGS: Supine abdominal radiograph with signs of moderate colonic distension of the rectosigmoid colon up to 7 cm. Stool and gas mixed throughout the remainder of the colon. Scattered gas-filled loops of small bowel. Signs of stent placement into the pancreatic duct with similar appearance. Lung bases are clear. On limited assessment there is no acute skeletal process. Spinal degenerative changes. IMPRESSION: Gaseous distension of bowel loops particularly rectosigmoid colon since previous imaging. Findings may reflect developing global ileus. Distension of the rectosigmoid near 7 cm warrants close attention on follow-up. Correlate with abdominal symptoms. Signs of stent placement into the pancreatic duct. These results will be called to the ordering clinician or representative by the Radiologist Assistant, and communication documented in the PACS or [employer]. Electronically Signed   By: Zetta Bills M.D.   On: 08/08/2021 14:48   NM Hepatobiliary Liver Func  Result Date: 08/09/2021 CLINICAL DATA:  Choledocholithiasis. Prior cholecystectomy with small gallbladder remnant. EXAM: NUCLEAR MEDICINE HEPATOBILIARY IMAGING TECHNIQUE: Sequential images of the abdomen were obtained out to 60 minutes following intravenous administration of radiopharmaceutical. RADIOPHARMACEUTICALS:  5.3 mCi Tc-35m Choletec IV COMPARISON:  3 CT 08/06/2021,  MRI 08/06/2021, ERCP 08/07/2021 FINDINGS: Prompt clearance radiotracer from the blood pool and homogeneous uptake in liver. Counts are evident in the common bile duct and small bowel by 20 minutes. No evidence of biliary obstruction. Small cystic duct remnant/gallbladder remnant is noted. No evidence of biliary leak. IMPRESSION: 1. Patent common bile duct.  No evidence of biliary obstruction. 2. Small cystic duct remnant/gallbladder remnant noted. Electronically Signed    By: Suzy Bouchard M.D.   On: 08/09/2021 11:37    Anti-infectives: Anti-infectives (From admission, onward)    Start     Dose/Rate Route Frequency Ordered Stop   08/08/21 1800  piperacillin-tazobactam (ZOSYN) IVPB 3.375 g        3.375 g 12.5 mL/hr over 240 Minutes Intravenous Every 8 hours 08/08/21 1117     08/08/21 0200  meropenem (MERREM) 1 g in sodium chloride 0.9 % 100 mL IVPB  Status:  Discontinued        1 g 200 mL/hr over 30 Minutes Intravenous Every 8 hours 08/07/21 1605 08/08/21 1115   08/07/21 1530  meropenem (MERREM) 1 g in sodium chloride 0.9 % 100 mL IVPB        1 g 200 mL/hr over 30 Minutes Intravenous  Once 08/07/21 1526 08/07/21 1913        Assessment/Plan Biliary pancreatitis S/P open cholecystectomy 30 years ago with remnant gallbladder - lipase >1600 on admit and was downtrending - 73 8/31 - Tbili 3.9 on admit and down to 1.2 8/31 - US/CT showed retained gallstones in gallbladder fossa but no mention of remnant gallbladder - MRCP showed sizable gallbladder remnant containing multiple gallstones - s/p ERCP 8/29 - HIDA yesterday confirms remnant gallbladder - would not recommend any further procedural intervention in setting of severe pancreatitis - surgical or endoscopic - patient was discussed with our hepatobiliary surgeons who recommend eventual resection of remnant gallbladder vs biliary bypass if patient has primary biliary stones but patient would need to recover from pancreatitis prior to any of this. I will schedule outpatient follow up with either Dr. Barry Dienes or Dr. Zenia Resides in 4 weeks to discuss this further.  - general surgery will sign off at this time but are available as needed for questions or concerns    FEN: SOFT diet, IVF per primary  VTE: LMWH ID: merrem 8/29>8/30; Zosyn 8/30>> No real indication for further abx from an abdominal standpoint   HTN Deafness   LOS: 5 days    Norm Parcel, [facility] Surgery 08/10/2021, 11:23  AM Please see Amion for pager number during day hours 7:00am-4:30pm

## 2021-08-10 NOTE — Progress Notes (Signed)
[facility] Gastroenterology Progress Note  Hunter Ward 70 y.o. [birth year]  CC:  Gallstone pancreatitis s/p ERCP 8/29  Subjective: Patient reports continued abdominal pain, which he describes as "throbbing." Denies nausea/vomiting. Reports only a small BM today.  Ate a small amount of pancakes and scrambled eggs.    [employer] video interpreting services was utilized for this encounter (Scott, ASL, # [id number])  ROS : Review of Systems  Cardiovascular:  Negative for chest pain and palpitations.  Gastrointestinal:  Positive for abdominal pain and constipation. Negative for blood in stool, diarrhea, heartburn, melena, nausea and vomiting.     Objective: Vital signs in last 24 hours: Vitals:   08/09/21 2038 08/10/21 0643  BP: 120/76 116/63  Pulse: 67 (!) 58  Resp: 18 18  Temp: 99.1 F (37.3 C) 98.3 F (36.8 C)  SpO2: 96% 97%    Physical Exam:  General:  Alert, cooperative, no distress  Head:  Normocephalic, without obvious abnormality, atraumatic  Eyes:  Anicteric sclera, EOMs intact  Lungs:   Clear to auscultation bilaterally, respirations unlabored  Heart:  Regular rate and rhythm, S1, S2 normal  Abdomen:   Soft and non-distended with moderate epigastric tenderness to palpation, bowel sounds active all four quadrants  Extremities: Extremities normal, atraumatic, no  edema    Lab Results: Recent Labs    08/08/21 0419 08/09/21 0434  NA 138 139  K 4.1 3.6  CL 107 107  CO2 27 28  GLUCOSE 110* 87  BUN 21 19  CREATININE 0.78 0.67  CALCIUM 8.2* 8.0*   Recent Labs    08/08/21 0419 08/09/21 0434  AST 34 21  ALT 73* 51*  ALKPHOS 62 56  BILITOT 1.3* 1.2  PROT 5.1* 5.1*  ALBUMIN 2.6* 2.5*   Recent Labs    08/08/21 0419 08/09/21 0434  WBC 14.4* 10.7*  HGB 11.5* 10.9*  HCT 35.3* 32.6*  MCV 101.4* 99.7  PLT 82* 93*   No results for input(s): LABPROT, INR in the last 72 hours.   Assessment: Severe biliary pancreatitis, s/p ERCP 8/29 with 2 CBD stones removed and  plastic pancreatic stent placement HIDA scan showed patent common bile duct, no evidence of biliary obstruction, small cystic duct remnant/gallbladder remnant-previous CAT scan showed stones within gallbladder remnant -Normal renal function BUN 19/ Cr 0.67   Mildly elevated ALT (51), otherwise normal LFTs as of 9/1. Improvement in leukocytosis (10.7 as of 9/1) Mild thrombocytopenia (93K/uL as of 9/1   Plan: Continue lactated Ringer's at 100cc/hr for now, possibly til discharge if no signs of fluid overload.  Start Miralax daily. Continue colace BID.  Empirically on IV Zosyn, as MRI showed concerns for necrosis of pancreatic head and neck in setting of acute severe pancreatitis   Eventually patient will require repeat imaging with IV contrast in a few weeks for follow-up of severe pancreatitis with possible necrosis.   Patient noted to have stones within gallbladder remnant, surgery not planning to proceed with surgery given severity of pancreatitis.  After resolution of pancreatitis, will send referral to [facility] for consideration of endoscopic drainage of GB remnant (EUS-guided transduodenal drainage vs. ERCP with transpapillary cystic duct stent placement) to prevent recurrent choledocholithiasis. Patient in agreement with plan.  Consider discharge in morning if pain well-controlled and able to tolerate diet.  [facility] will follow.  Salley Slaughter PA-C 08/10/2021, 10:34 AM  Contact #  [phone]

## 2021-08-10 NOTE — Progress Notes (Signed)
Mobility Specialist - Progress Note    08/10/21 1055  Mobility  Activity Ambulated in hall  Level of Assistance Standby assist, set-up cues, supervision of patient - no hands on  Assistive Device Front wheel walker  Distance Ambulated (ft) 500 ft  Mobility Ambulated with assistance in hallway  Mobility Response Tolerated well  Mobility performed by Mobility specialist  $Mobility charge 1 Mobility   Pt ambulated 500 ft in hallway using RW and required 2 standing rest breaks due to feeling short of breath. Pt was returned to room after session and left with call bell at side and MD in room.   [place] Specialist Acute Rehabilitation Services Phone: [phone] 08/10/21, 10:56 AM

## 2021-08-10 NOTE — Progress Notes (Signed)
PROGRESS NOTE    Hunter Ward  [id number] DOB: 11-25-1951 DOA: 08/05/2021 PCP: Gaynelle Arabian, MD    Brief Narrative:  70 year old with history of hypertension, history of prior pancreatitis and cholecystectomy 30 years ago presented to the emergency room with nausea vomiting abdominal pain along with constipation.  Stable in the ER.  Found to have lipase more than 1600.  CT scan of the abdomen showed acute pancreatitis with retained stone in the gallbladder fossa, common bile duct 9 mm.  LFT elevated.  Bilirubin was 3.9   Assessment & Plan:   Principal Problem:   Acute pancreatitis Active Problems:   HTN (hypertension)   Serum total bilirubin elevated   Transaminitis  Acute severe gallstone pancreatitis with necrosis, transaminitis and choledocholithiasis: Some clinical improvement today. Status post ERCP and biliary stent placement, followed by gastroenterology.  Bilirubin and LFTs normalized. Surgery was following for possible cholecystectomy for remnant of gallbladder fossa, deferred. Continue adequate pain medications.  Continue IV fluids.  Discontinue IV Zosyn. He is having significant pain, will increase doses of Dilaudid. Also complains of more burning and retrosternal pain, will add high-dose PPI today.  Essential hypertension blood pressure stable on current regimen.  Constipation: On Colace and MiraLAX.    DVT prophylaxis: enoxaparin (LOVENOX) injection 40 mg Start: 08/05/21 2230   Code Status: Full code Family Communication: Niece on the phone. Disposition Plan: Status is: Inpatient  Remains inpatient appropriate because:IV treatments appropriate due to intensity of illness or inability to take PO and Inpatient level of care appropriate due to severity of illness  Dispo: The patient is from: Home              Anticipated d/c is to: Home              Patient currently is not medically stable to d/c.   Difficult to place patient No          Consultants:  Gastroenterology Surgery  Procedures:  ERCP 8/29  Antimicrobials:  IV Zosyn 8/27---   Subjective: Patient seen and examined.  I used Marketing executive services for sign language.  Patient told us that he is having a lot of throbbing pain in his epigastrium.  He feels uncomfortable.  Denies any nausea vomiting.  Also complains of retrosternal burning sensation. He was wondering whether he has a bump in his epigastrium pointing to his xiphisternum.  Objective: Vitals:   08/09/21 0535 08/09/21 1328 08/09/21 2038 08/10/21 0643  BP: 105/66 117/62 120/76 116/63  Pulse: (!) 51 61 67 (!) 58  Resp: '16 18 18 18  '$ Temp: 98.4 F (36.9 C) 98 F (36.7 C) 99.1 F (37.3 C) 98.3 F (36.8 C)  TempSrc: Oral Oral Oral Oral  SpO2: 94% 97% 96% 97%  Weight:      Height:        Intake/Output Summary (Last 24 hours) at 08/10/2021 1129 Last data filed at 08/10/2021 1000 Gross per 24 hour  Intake 4282.47 ml  Output 2400 ml  Net 1882.47 ml   Filed Weights   08/05/21 1621 08/07/21 1406  Weight: 73.9 kg 73.9 kg    Examination:  General: Looks fairly comfortable.  Anxious with language barrier. Cardiovascular: S1-S2 normal. Respiratory: Bilateral clear.  No added sounds. Gastrointestinal: Soft.  Mild tenderness in the epigastric region.  No rigidity or guarding. Ext: No swelling or edema.  No cyanosis. Neuro: Alert and awake.  No focal deficits.  Walking in the hallway.      Data Reviewed: I  have personally reviewed following labs and imaging studies  CBC: Recent Labs  Lab 08/05/21 1632 08/06/21 0459 08/07/21 0851 08/08/21 0419 08/09/21 0434  WBC 13.2* 19.8* 23.6* 14.4* 10.7*  NEUTROABS 12.3*  --   --   --   --   HGB 14.2 14.9 14.3 11.5* 10.9*  HCT 42.0 43.9 44.1 35.3* 32.6*  MCV 97.7 98.0 102.1* 101.4* 99.7  PLT 153 144* 108* 82* 93*   Basic Metabolic Panel: Recent Labs  Lab 08/05/21 1632 08/06/21 0459 08/07/21 0420 08/08/21 0419 08/09/21 0434  NA 138  137 137 138 139  K 4.0 4.2 4.2 4.1 3.6  CL 101 104 105 107 107  CO2 '24 25 26 27 28  '$ GLUCOSE 147* 129* 101* 110* 87  BUN '17 22 23 21 19  '$ CREATININE 0.99 0.86 0.74 0.78 0.67  CALCIUM 9.8 9.1 8.7* 8.2* 8.0*   GFR: Estimated Creatinine Clearance: 88.5 mL/min (by C-G formula based on SCr of 0.67 mg/dL). Liver Function Tests: Recent Labs  Lab 08/05/21 1632 08/06/21 0459 08/07/21 0420 08/08/21 0419 08/09/21 0434  AST 279* 220* 61* 34 21  ALT 277* 239* 128* 73* 51*  ALKPHOS 129* 123 79 62 56  BILITOT 3.9* 3.6* 1.9* 1.3* 1.2  PROT 7.0 6.6 5.8* 5.1* 5.1*  ALBUMIN 4.1 3.9 3.1* 2.6* 2.5*   Recent Labs  Lab 08/05/21 1632 08/06/21 0459 08/07/21 0420 08/08/21 0419  LIPASE 1,649* 771* 177* 73*   No results for input(s): AMMONIA in the last 168 hours. Coagulation Profile: No results for input(s): INR, PROTIME in the last 168 hours. Cardiac Enzymes: No results for input(s): CKTOTAL, CKMB, CKMBINDEX, TROPONINI in the last 168 hours. BNP (last 3 results) No results for input(s): PROBNP in the last 8760 hours. HbA1C: No results for input(s): HGBA1C in the last 72 hours. CBG: No results for input(s): GLUCAP in the last 168 hours. Lipid Profile: No results for input(s): CHOL, HDL, LDLCALC, TRIG, CHOLHDL, LDLDIRECT in the last 72 hours. Thyroid Function Tests: No results for input(s): TSH, T4TOTAL, FREET4, T3FREE, THYROIDAB in the last 72 hours. Anemia Panel: No results for input(s): VITAMINB12, FOLATE, FERRITIN, TIBC, IRON, RETICCTPCT in the last 72 hours. Sepsis Labs: Recent Labs  Lab 08/07/21 0851  PROCALCITON 0.61    Recent Results (from the past 240 hour(s))  Resp Panel by RT-PCR (Flu A&B, Covid) Nasopharyngeal Swab     Status: None   Collection Time: 08/05/21  6:46 PM   Specimen: Nasopharyngeal Swab; Nasopharyngeal(NP) swabs in vial transport medium  Result Value Ref Range Status   SARS Coronavirus 2 by RT PCR NEGATIVE NEGATIVE Final    Comment: (NOTE) SARS-CoV-2 target  nucleic acids are NOT DETECTED.  The SARS-CoV-2 RNA is generally detectable in upper respiratory specimens during the acute phase of infection. The lowest concentration of SARS-CoV-2 viral copies this assay can detect is 138 copies/mL. A negative result does not preclude SARS-Cov-2 infection and should not be used as the sole basis for treatment or other patient management decisions. A negative result may occur with  improper specimen collection/handling, submission of specimen other than nasopharyngeal swab, presence of viral mutation(s) within the areas targeted by this assay, and inadequate number of viral copies(<138 copies/mL). A negative result must be combined with clinical observations, patient history, and epidemiological information. The expected result is Negative.  Fact Sheet for Patients:  EntrepreneurPulse.com.au  Fact Sheet for Healthcare Providers:  IncredibleEmployment.be  This test is no t yet approved or cleared by the [place] and  has been authorized for detection and/or diagnosis of SARS-CoV-2 by FDA under an Emergency Use Authorization (EUA). This EUA will remain  in effect (meaning this test can be used) for the duration of the COVID-19 declaration under Section 564(b)(1) of the Act, 21 U.S.C.section 360bbb-3(b)(1), unless the authorization is terminated  or revoked sooner.       Influenza A by PCR NEGATIVE NEGATIVE Final   Influenza B by PCR NEGATIVE NEGATIVE Final    Comment: (NOTE) The Xpert Xpress SARS-CoV-2/FLU/RSV plus assay is intended as an aid in the diagnosis of influenza from Nasopharyngeal swab specimens and should not be used as a sole basis for treatment. Nasal washings and aspirates are unacceptable for Xpert Xpress SARS-CoV-2/FLU/RSV testing.  Fact Sheet for Patients: EntrepreneurPulse.com.au  Fact Sheet for Healthcare  Providers: IncredibleEmployment.be  This test is not yet approved or cleared by the [place] FDA and has been authorized for detection and/or diagnosis of SARS-CoV-2 by FDA under an Emergency Use Authorization (EUA). This EUA will remain in effect (meaning this test can be used) for the duration of the COVID-19 declaration under Section 564(b)(1) of the Act, 21 U.S.C. section 360bbb-3(b)(1), unless the authorization is terminated or revoked.  Performed at [facility], [address], [place], [place] [address]   Culture, blood (routine x 2)     Status: None (Preliminary result)   Collection Time: 08/07/21  6:57 PM   Specimen: BLOOD  Result Value Ref Range Status   Specimen Description   Final    BLOOD LEFT ANTECUBITAL Performed at [facility] [address], [place], [place] [address]    Special Requests   Final    BOTTLES DRAWN AEROBIC ONLY Blood Culture adequate volume Performed at [facility] [address], [place], [place] [address]    Culture   Final    NO GROWTH 2 DAYS Performed at [facility] [address] [place], [place] [address]    Report Status PENDING  Incomplete  Culture, blood (routine x 2)     Status: None (Preliminary result)   Collection Time: 08/07/21  6:57 PM   Specimen: BLOOD  Result Value Ref Range Status   Specimen Description   Final    BLOOD RIGHT ANTECUBITAL Performed at [facility] [address], [place], [place] [address]    Special Requests   Final    BOTTLES DRAWN AEROBIC ONLY Blood Culture adequate volume Performed at [facility] [address] [place], [place] [address]    Culture   Final    NO GROWTH 2 DAYS Performed at [facility] [address] [place], [place] [address]    Report Status PENDING  Incomplete         Radiology Studies: DG Abd 1 View  Result Date:  08/08/2021 CLINICAL DATA:  Emesis, abdominal pain. EXAM: ABDOMEN - 1 VIEW COMPARISON:  MRI abdomen/MRCP from August 06, 2021. FINDINGS: Supine abdominal radiograph with signs of moderate colonic distension of the rectosigmoid colon up to 7 cm. Stool and gas mixed throughout the remainder of the colon. Scattered gas-filled loops of small bowel. Signs of stent placement into the pancreatic duct with similar appearance. Lung bases are clear. On limited assessment there is no acute skeletal process. Spinal degenerative changes. IMPRESSION: Gaseous distension of bowel loops particularly rectosigmoid colon since previous imaging. Findings may reflect developing global ileus. Distension of the rectosigmoid near 7 cm warrants close attention on follow-up. Correlate  with abdominal symptoms. Signs of stent placement into the pancreatic duct. These results will be called to the ordering clinician or representative by the Radiologist Assistant, and communication documented in the PACS or [employer]. Electronically Signed   By: Zetta Bills M.D.   On: 08/08/2021 14:48   NM Hepatobiliary Liver Func  Result Date: 08/09/2021 CLINICAL DATA:  Choledocholithiasis. Prior cholecystectomy with small gallbladder remnant. EXAM: NUCLEAR MEDICINE HEPATOBILIARY IMAGING TECHNIQUE: Sequential images of the abdomen were obtained out to 60 minutes following intravenous administration of radiopharmaceutical. RADIOPHARMACEUTICALS:  5.3 mCi Tc-42m Choletec IV COMPARISON:  3 CT 08/06/2021, MRI 08/06/2021, ERCP 08/07/2021 FINDINGS: Prompt clearance radiotracer from the blood pool and homogeneous uptake in liver. Counts are evident in the common bile duct and small bowel by 20 minutes. No evidence of biliary obstruction. Small cystic duct remnant/gallbladder remnant is noted. No evidence of biliary leak. IMPRESSION: 1. Patent common bile duct.  No evidence of biliary obstruction. 2. Small cystic duct remnant/gallbladder remnant noted.  Electronically Signed   By: SSuzy BouchardM.D.   On: 08/09/2021 11:37        Scheduled Meds:  docusate sodium  100 mg Oral BID   enoxaparin (LOVENOX) injection  40 mg Subcutaneous Q24H   ondansetron (ZOFRAN) IV  4 mg Intravenous Once   pantoprazole (PROTONIX) IV  40 mg Intravenous Q12H   [START ON 08/11/2021] polyethylene glycol  17 g Oral Daily   Continuous Infusions:  lactated ringers 100 mL/hr at 08/10/21 0923     LOS: 5 days    Time spent: 30 minutes    KBarb Merino MD [facility] Pager [phone]

## 2021-08-11 LAB — CBC WITH DIFFERENTIAL/PLATELET
Abs Immature Granulocytes: 0.1 10*3/uL — ABNORMAL HIGH (ref 0.00–0.07)
Basophils Absolute: 0 10*3/uL (ref 0.0–0.1)
Basophils Relative: 0 %
Eosinophils Absolute: 0.1 10*3/uL (ref 0.0–0.5)
Eosinophils Relative: 1 %
HCT: 32.7 % — ABNORMAL LOW (ref 39.0–52.0)
Hemoglobin: 11.1 g/dL — ABNORMAL LOW (ref 13.0–17.0)
Immature Granulocytes: 1 %
Lymphocytes Relative: 11 %
Lymphs Abs: 0.9 10*3/uL (ref 0.7–4.0)
MCH: 33.5 pg (ref 26.0–34.0)
MCHC: 33.9 g/dL (ref 30.0–36.0)
MCV: 98.8 fL (ref 80.0–100.0)
Monocytes Absolute: 0.7 10*3/uL (ref 0.1–1.0)
Monocytes Relative: 8 %
Neutro Abs: 6.8 10*3/uL (ref 1.7–7.7)
Neutrophils Relative %: 79 %
Platelets: 124 10*3/uL — ABNORMAL LOW (ref 150–400)
RBC: 3.31 MIL/uL — ABNORMAL LOW (ref 4.22–5.81)
RDW: 13.2 % (ref 11.5–15.5)
WBC: 8.6 10*3/uL (ref 4.0–10.5)
nRBC: 0 % (ref 0.0–0.2)

## 2021-08-11 LAB — COMPREHENSIVE METABOLIC PANEL
ALT: 32 U/L (ref 0–44)
AST: 19 U/L (ref 15–41)
Albumin: 2.3 g/dL — ABNORMAL LOW (ref 3.5–5.0)
Alkaline Phosphatase: 52 U/L (ref 38–126)
Anion gap: 5 (ref 5–15)
BUN: 14 mg/dL (ref 8–23)
CO2: 27 mmol/L (ref 22–32)
Calcium: 7.9 mg/dL — ABNORMAL LOW (ref 8.9–10.3)
Chloride: 104 mmol/L (ref 98–111)
Creatinine, Ser: 0.59 mg/dL — ABNORMAL LOW (ref 0.61–1.24)
GFR, Estimated: >60 mL/min (ref >60)
Glucose, Bld: 102 mg/dL — ABNORMAL HIGH (ref 70–99)
Potassium: 3.7 mmol/L (ref 3.5–5.1)
Sodium: 136 mmol/L (ref 135–145)
Total Bilirubin: 0.9 mg/dL (ref 0.3–1.2)
Total Protein: 4.8 g/dL — ABNORMAL LOW (ref 6.5–8.1)

## 2021-08-11 LAB — PHOSPHORUS: Phosphorus: 2.1 mg/dL — ABNORMAL LOW (ref 2.5–4.6)

## 2021-08-11 LAB — MAGNESIUM: Magnesium: 1.9 mg/dL (ref 1.7–2.4)

## 2021-08-11 MED ORDER — BISACODYL 10 MG RE SUPP
10.0000 mg | Freq: Once | RECTAL | Status: AC
  Administered 2021-08-11: 10 mg via RECTAL
  Filled 2021-08-11: qty 1

## 2021-08-11 MED ORDER — PANTOPRAZOLE SODIUM 40 MG PO TBEC
40.0000 mg | DELAYED_RELEASE_TABLET | Freq: Every day | ORAL | Status: DC
  Administered 2021-08-12: 40 mg via ORAL
  Filled 2021-08-11: qty 1

## 2021-08-11 MED ORDER — POLYETHYLENE GLYCOL 3350 17 G PO PACK
17.0000 g | PACK | Freq: Two times a day (BID) | ORAL | Status: DC
  Administered 2021-08-11 – 2021-08-12 (×2): 17 g via ORAL
  Filled 2021-08-11 (×2): qty 1

## 2021-08-11 NOTE — Progress Notes (Signed)
PROGRESS NOTE    Hunter Ward  [id number] DOB: [birth year] DOA: 08/05/2021 PCP: Gaynelle Arabian, MD    Brief Narrative:  70 year old with history of hypertension, history of prior pancreatitis and cholecystectomy 30 years ago presented to the emergency room with nausea vomiting abdominal pain along with constipation.  Stable in the ER.  Found to have lipase more than 1600.  CT scan of the abdomen showed acute pancreatitis with retained stone in the gallbladder fossa, common bile duct 9 mm.  LFT elevated.  Bilirubin was 3.9   Assessment & Plan:   Principal Problem:   Acute pancreatitis Active Problems:   HTN (hypertension)   Serum total bilirubin elevated   Transaminitis  Acute severe gallstone pancreatitis with necrosis, transaminitis and choledocholithiasis: Clinically improved. Tolerating soft diet. Developed significant constipation and epigastric bloating.  On PPI. Status post ERCP and biliary stent placement, followed by gastroenterology.  Bilirubin and LFTs normalized. Surgery was following for possible cholecystectomy for remnant of gallbladder fossa, deferred. Continue adequate pain medications.  Continue IV fluids.   Aggressive bowel regimen today.  Essential hypertension blood pressure stable on current regimen.  Constipation: On Colace and MiraLAX.    DVT prophylaxis: enoxaparin (LOVENOX) injection 40 mg Start: 08/05/21 2230   Code Status: Full code Family Communication: Niece on the phone. Disposition Plan: Status is: Inpatient  Remains inpatient appropriate because:IV treatments appropriate due to intensity of illness or inability to take PO and Inpatient level of care appropriate due to severity of illness  Dispo: The patient is from: Home              Anticipated d/c is to: Home.  Anticipate tomorrow.              Patient currently is not medically stable to d/c.   Difficult to place patient No         Consultants:   Gastroenterology Surgery  Procedures:  ERCP 8/29  Antimicrobials:  IV Zosyn 8/27--- 9/1.   Subjective: Patient seen and examined.  No overnight events.  He is more worried about some bloating sensation and constipation.  Denies any nausea or vomiting.  Objective: Vitals:   08/10/21 0643 08/10/21 1446 08/10/21 2142 08/11/21 0609  BP: 116/63 112/80 111/68 106/68  Pulse: (!) 58 62 (!) 59 (!) 54  Resp: '18 15 18 18  '$ Temp: 98.3 F (36.8 C) 98.4 F (36.9 C) 98.7 F (37.1 C) 99 F (37.2 C)  TempSrc: Oral Oral Oral Oral  SpO2: 97% 99% 98% 98%  Weight:      Height:        Intake/Output Summary (Last 24 hours) at 08/11/2021 1357 Last data filed at 08/11/2021 1000 Gross per 24 hour  Intake 2818.12 ml  Output 550 ml  Net 2268.12 ml   Filed Weights   08/05/21 1621 08/07/21 1406  Weight: 73.9 kg 73.9 kg    Examination:  General: Looks fairly comfortable.   Cardiovascular: S1-S2 normal. Respiratory: Bilateral clear.  No added sounds. Gastrointestinal: Soft.  Mild tenderness in the epigastric region.  No rigidity or guarding. Ext: No swelling or edema.  No cyanosis. Neuro: Alert and awake.  No focal deficits.  Walking in the hallway.      Data Reviewed: I have personally reviewed following labs and imaging studies  CBC: Recent Labs  Lab 08/05/21 1632 08/06/21 0459 08/07/21 0851 08/08/21 0419 08/09/21 0434 08/11/21 0433  WBC 13.2* 19.8* 23.6* 14.4* 10.7* 8.6  NEUTROABS 12.3*  --   --   --   --  6.8  HGB 14.2 14.9 14.3 11.5* 10.9* 11.1*  HCT 42.0 43.9 44.1 35.3* 32.6* 32.7*  MCV 97.7 98.0 102.1* 101.4* 99.7 98.8  PLT 153 144* 108* 82* 93* [id number]*   Basic Metabolic Panel: Recent Labs  Lab 08/06/21 0459 08/07/21 0420 08/08/21 0419 08/09/21 0434 08/11/21 0433  NA 137 137 138 139 136  K 4.2 4.2 4.1 3.6 3.7  CL 104 105 107 107 104  CO2 '25 26 27 28 27  '$ GLUCOSE 129* 101* 110* 87 102*  BUN '22 23 21 19 14  '$ CREATININE 0.86 0.74 0.78 0.67 0.59*  CALCIUM 9.1 8.7*  8.2* 8.0* 7.9*  MG  --   --   --   --  1.9  PHOS  --   --   --   --  2.1*   GFR: Estimated Creatinine Clearance: 88.5 mL/min (A) (by C-G formula based on SCr of 0.59 mg/dL (L)). Liver Function Tests: Recent Labs  Lab 08/06/21 0459 08/07/21 0420 08/08/21 0419 08/09/21 0434 08/11/21 0433  AST 220* 61* 34 21 19  ALT 239* 128* 73* 51* 32  ALKPHOS 123 79 62 56 52  BILITOT 3.6* 1.9* 1.3* 1.2 0.9  PROT 6.6 5.8* 5.1* 5.1* 4.8*  ALBUMIN 3.9 3.1* 2.6* 2.5* 2.3*   Recent Labs  Lab 08/05/21 1632 08/06/21 0459 08/07/21 0420 08/08/21 0419  LIPASE 1,649* 771* 177* 73*   No results for input(s): AMMONIA in the last 168 hours. Coagulation Profile: No results for input(s): INR, PROTIME in the last 168 hours. Cardiac Enzymes: No results for input(s): CKTOTAL, CKMB, CKMBINDEX, TROPONINI in the last 168 hours. BNP (last 3 results) No results for input(s): PROBNP in the last 8760 hours. HbA1C: No results for input(s): HGBA1C in the last 72 hours. CBG: No results for input(s): GLUCAP in the last 168 hours. Lipid Profile: No results for input(s): CHOL, HDL, LDLCALC, TRIG, CHOLHDL, LDLDIRECT in the last 72 hours. Thyroid Function Tests: No results for input(s): TSH, T4TOTAL, FREET4, T3FREE, THYROIDAB in the last 72 hours. Anemia Panel: No results for input(s): VITAMINB12, FOLATE, FERRITIN, TIBC, IRON, RETICCTPCT in the last 72 hours. Sepsis Labs: Recent Labs  Lab 08/07/21 0851  PROCALCITON 0.61    Recent Results (from the past 240 hour(s))  Resp Panel by RT-PCR (Flu A&B, Covid) Nasopharyngeal Swab     Status: None   Collection Time: 08/05/21  6:46 PM   Specimen: Nasopharyngeal Swab; Nasopharyngeal(NP) swabs in vial transport medium  Result Value Ref Range Status   SARS Coronavirus 2 by RT PCR NEGATIVE NEGATIVE Final    Comment: (NOTE) SARS-CoV-2 target nucleic acids are NOT DETECTED.  The SARS-CoV-2 RNA is generally detectable in upper respiratory specimens during the acute  phase of infection. The lowest concentration of SARS-CoV-2 viral copies this assay can detect is 138 copies/mL. A negative result does not preclude SARS-Cov-2 infection and should not be used as the sole basis for treatment or other patient management decisions. A negative result may occur with  improper specimen collection/handling, submission of specimen other than nasopharyngeal swab, presence of viral mutation(s) within the areas targeted by this assay, and inadequate number of viral copies(<138 copies/mL). A negative result must be combined with clinical observations, patient history, and epidemiological information. The expected result is Negative.  Fact Sheet for Patients:  EntrepreneurPulse.com.au  Fact Sheet for Healthcare Providers:  IncredibleEmployment.be  This test is no t yet approved or cleared by the [place] FDA and  has been authorized for detection and/or diagnosis of SARS-CoV-2  by FDA under an Emergency Use Authorization (EUA). This EUA will remain  in effect (meaning this test can be used) for the duration of the COVID-19 declaration under Section 564(b)(1) of the Act, 21 U.S.C.section 360bbb-3(b)(1), unless the authorization is terminated  or revoked sooner.       Influenza A by PCR NEGATIVE NEGATIVE Final   Influenza B by PCR NEGATIVE NEGATIVE Final    Comment: (NOTE) The Xpert Xpress SARS-CoV-2/FLU/RSV plus assay is intended as an aid in the diagnosis of influenza from Nasopharyngeal swab specimens and should not be used as a sole basis for treatment. Nasal washings and aspirates are unacceptable for Xpert Xpress SARS-CoV-2/FLU/RSV testing.  Fact Sheet for Patients: EntrepreneurPulse.com.au  Fact Sheet for Healthcare Providers: IncredibleEmployment.be  This test is not yet approved or cleared by the [place] FDA and has been authorized for detection and/or diagnosis of  SARS-CoV-2 by FDA under an Emergency Use Authorization (EUA). This EUA will remain in effect (meaning this test can be used) for the duration of the COVID-19 declaration under Section 564(b)(1) of the Act, 21 U.S.C. section 360bbb-3(b)(1), unless the authorization is terminated or revoked.  Performed at [facility], [address], [place], [place] [address]   Culture, blood (routine x 2)     Status: None (Preliminary result)   Collection Time: 08/07/21  6:57 PM   Specimen: BLOOD  Result Value Ref Range Status   Specimen Description   Final    BLOOD LEFT ANTECUBITAL Performed at [facility] [address], [place], [place] [address]    Special Requests   Final    BOTTLES DRAWN AEROBIC ONLY Blood Culture adequate volume Performed at [facility] [address], [place], [place] [address]    Culture   Final    NO GROWTH 3 DAYS Performed at [facility], [place] [address] [place], [place] [address]    Report Status PENDING  Incomplete  Culture, blood (routine x 2)     Status: None (Preliminary result)   Collection Time: 08/07/21  6:57 PM   Specimen: BLOOD  Result Value Ref Range Status   Specimen Description   Final    BLOOD RIGHT ANTECUBITAL Performed at [facility] [address], [place], [place] [address]    Special Requests   Final    BOTTLES DRAWN AEROBIC ONLY Blood Culture adequate volume Performed at [facility] [address] [place],  [address]    Culture   Final    NO GROWTH 3 DAYS Performed at [facility], [place] [address] [place],  [address]    Report Status PENDING  Incomplete         Radiology Studies: No results found.      Scheduled Meds:  docusate sodium  100 mg Oral BID   enoxaparin (LOVENOX) injection  40 mg Subcutaneous Q24H   ondansetron (ZOFRAN) IV  4 mg Intravenous Once   [START ON 08/12/2021] pantoprazole  40  mg Oral Daily   polyethylene glycol  17 g Oral BID   Continuous Infusions:  lactated ringers 100 mL/hr at 08/10/21 0923     LOS: 6 days    Time spent: 30 minutes    Barb Merino, MD [facility] Pager [phone]

## 2021-08-11 NOTE — Progress Notes (Signed)
[facility] Gastroenterology Progress Note  Hunter Ward 70 y.o. 01/29/1951  CC:  Gallstone pancreatitis s/p ERCP 8/29   Subjective: Patient reports improvement in epigastric pain. Endorses constipation, straining, LLQ pain, and scant rectal bleeding associated with straining. Has not had a full BM.  Denies nausea/vomiting. .  [employer] video interpreting services was utilized for this encounter (Sasha, ASL, # [id number])  ROS : Review of Systems  Cardiovascular:  Negative for chest pain and palpitations.  Gastrointestinal:  Positive for abdominal pain (LLQ), blood in stool and constipation. Negative for diarrhea, heartburn, melena, nausea and vomiting.     Objective: Vital signs in last 24 hours: Vitals:   08/10/21 2142 08/11/21 0609  BP: 111/68 106/68  Pulse: (!) 59 (!) 54  Resp: 18 18  Temp: 98.7 F (37.1 C) 99 F (37.2 C)  SpO2: 98% 98%    Physical Exam:  General:  Alert, cooperative, no distress, appears stated age  Head:  Normocephalic, without obvious abnormality, atraumatic  Eyes:  Anicteric sclera, EOM's intact,   Lungs:   Clear to auscultation bilaterally, respirations unlabored  Heart:  Mildly bradycardic with regular rhythm  Abdomen:   Soft, non-distended, mild LLQ discomfort upon palpation, bowel sounds active all four quadrants,  no masses,     Lab Results: Recent Labs    08/09/21 0434 08/11/21 0433  NA 139 136  K 3.6 3.7  CL 107 104  CO2 28 27  GLUCOSE 87 102*  BUN 19 14  CREATININE 0.67 0.59*  CALCIUM 8.0* 7.9*  MG  --  1.9  PHOS  --  2.1*   Recent Labs    08/09/21 0434 08/11/21 0433  AST 21 19  ALT 51* 32  ALKPHOS 56 52  BILITOT 1.2 0.9  PROT 5.1* 4.8*  ALBUMIN 2.5* 2.3*   Recent Labs    08/09/21 0434 08/11/21 0433  WBC 10.7* 8.6  NEUTROABS  --  6.8  HGB 10.9* 11.1*  HCT 32.6* 32.7*  MCV 99.7 98.8  PLT 93* 124*   No results for input(s): LABPROT, INR in the last 72 hours.    Assessment Severe biliary pancreatitis, s/p ERCP 8/29 with  2 CBD stones removed and plastic pancreatic stent placement, clinically improving HIDA scan showed patent common bile duct, no evidence of biliary obstruction, small cystic duct remnant/gallbladder remnant - previous CAT scan showed stones within gallbladder remnant - HGB 11.2 (trending up from 10.9 yesterday) - BUN 14, Cr 0.59 (trending down from 0.67 yesterday) - AST 19, ALT 32 (ALT trending down from 51 yesterday)  Constipation likely related to decreased transit in the setting of pancreatitis and hospitalization - scant bright red blood with straining, but HGB is stable and steadily trending up.   Plan: If patient has a bowel movement should be stable for discharge.  Plan for f/u in office in 2-4 weeks.  patient will require repeat imaging with IV contrast in a few weeks for follow-up of severe pancreatitis with possible necrosis.  Patient noted to have stones within gallbladder remnant, surgery not planning to proceed with surgery given severity of pancreatitis.   After resolution of pancreatitis, will send referral to [facility] for consideration of endoscopic drainage of GB remnant (EUS-guided transduodenal drainage vs. ERCP with transpapillary cystic duct stent placement) to prevent recurrent choledocholithiasis. Patient in agreement with plan.   Garnette Scheuermann PA-C 08/11/2021, 8:24 AM  Contact #  [phone]

## 2021-08-11 NOTE — Progress Notes (Signed)
The patient is receiving Protonix by the intravenous route.  Based on criteria approved by the Pharmacy and [facility], the medication is being converted to the equivalent oral dose form.  These criteria include: -No active GI bleeding -Able to tolerate diet of full liquids (or better) or tube feeding -Able to tolerate other medications by the oral or enteral route - no N/V per GI note  If you have any questions about this conversion, please contact the Pharmacy Department (phone [phone]).  Thank you.   Minda Ditto PharmD [facility] [phone] 08/11/2021, 11:31 AM

## 2021-08-11 NOTE — Care Management Important Message (Signed)
Important Message  Patient Details IM Letter placed in Patient's room. Name: Hunter Ward MRN: [id number] Date of Birth: 1951/12/07   Medicare Important Message Given:  Yes     Kerin Salen 08/11/2021, 2:07 PM

## 2021-08-12 MED ORDER — DOCUSATE SODIUM 100 MG PO CAPS: 100.0000 mg | ORAL_CAPSULE | Freq: Two times a day (BID) | ORAL | 0 refills | Status: DC

## 2021-08-12 MED ORDER — HYDROMORPHONE HCL 2 MG PO TABS: 2.0000 mg | ORAL_TABLET | Freq: Four times a day (QID) | ORAL | 0 refills | Status: AC | PRN

## 2021-08-12 MED ORDER — PANTOPRAZOLE SODIUM 40 MG PO TBEC: 40.0000 mg | DELAYED_RELEASE_TABLET | Freq: Every day | ORAL | 0 refills | Status: DC

## 2021-08-12 NOTE — Progress Notes (Signed)
Reviewed written d/c instructions w pt and his niece Margarita Grizzle. All questions answered and they both verbalized understanding. D/C per w/c w all belongings in stable condition.

## 2021-08-12 NOTE — Discharge Summary (Signed)
Physician Discharge Summary  Hunter Ward [id number] DOB: 28-Oct-1951 DOA: 08/05/2021  PCP: Gaynelle Arabian, MD  Admit date: 08/05/2021 Discharge date: 08/12/2021  Admitted From: home  Disposition:  home   Recommendations for Outpatient Follow-up:  Follow up with PCP in 1-2 weeks Surgery and GI will schedule follow up   Home Health:NA  Equipment/Devices:NA   Discharge Condition:stable   CODE STATUS:Full Code  Diet recommendation: soft, low fat diet   Discharge Summary: 70 year old with history of hypertension, history of prior pancreatitis and cholecystectomy 30 years ago presented to the emergency room with nausea,vomiting , abdominal pain along with constipation.  Stable in the ER.  Found to have lipase more than 1600.  CT scan of the abdomen showed acute pancreatitis with retained stone in the gallbladder fossa, common bile duct 9 mm.  LFT elevated.  Bilirubin was 3.9. admitted with biliary pancreatitis.   # Acute severe gallstone pancreatitis with necrosis, transaminitis and choledocholithiasis: Clinically improved. Tolerating soft diet and good bowel movement now. Developed significant constipation and epigastric bloating.  On PPI. Status post ERCP and biliary stent placement, followed by gastroenterology.  Bilirubin and LFTs normalized. Surgery was following for possible cholecystectomy for remnant of gallbladder fossa, deferred for out patient. Adequately improved. Home with symptomatic treatment , bowel regimen and short course of pain medicine.  Surgery planning out patient follow up with Biliary surgery and probable cholecystectomy.  # Essential hypertension blood pressure stable on current regimen.  # Constipation: On Colace and MiraLAX. Continue on discharge.   Pain reasonably controlled. Tolerating diet with normal bowel function. Able to go home today with out patient follow up.       Discharge Diagnoses:  Principal Problem:   Acute pancreatitis Active  Problems:   HTN (hypertension)   Serum total bilirubin elevated   Transaminitis    Discharge Instructions  Discharge Instructions     Diet - low sodium heart healthy   Complete by: As directed    Low fat diet   Increase activity slowly   Complete by: As directed       Allergies as of 08/12/2021       Reactions   Codeine Itching, Nausea And Vomiting        Medication List     TAKE these medications    docusate sodium 100 MG capsule Commonly known as: COLACE Take 1 capsule (100 mg total) by mouth 2 (two) times daily.   HYDROmorphone 2 MG tablet Commonly known as: Dilaudid Take 1 tablet (2 mg total) by mouth every 6 (six) hours as needed for up to 5 days for severe pain.   lisinopril 20 MG tablet Commonly known as: ZESTRIL Take 20 mg by mouth daily.   NIFEdipine 30 MG 24 hr tablet Commonly known as: PROCARDIA-XL/NIFEDICAL-XL Take 30 mg by mouth daily.   pantoprazole 40 MG tablet Commonly known as: PROTONIX Take 1 tablet (40 mg total) by mouth daily.        Follow-up Information     Dwan Bolt, MD Follow up on 09/11/2021.   Specialty: General Surgery Why: 10:45 AM. Please arrive 30 min prior to appointment time for check in. Bring photo ID and insurance information. Contact information: [place]. [address]  [phone]                Allergies  Allergen Reactions   Codeine Itching and Nausea And Vomiting    Consultations: GI General surgery   Procedures/Studies: DG Abd 1  View  Result Date: 08/08/2021 CLINICAL DATA:  Emesis, abdominal pain. EXAM: ABDOMEN - 1 VIEW COMPARISON:  MRI abdomen/MRCP from August 06, 2021. FINDINGS: Supine abdominal radiograph with signs of moderate colonic distension of the rectosigmoid colon up to 7 cm. Stool and gas mixed throughout the remainder of the colon. Scattered gas-filled loops of small bowel. Signs of stent placement into the pancreatic duct with similar appearance.  Lung bases are clear. On limited assessment there is no acute skeletal process. Spinal degenerative changes. IMPRESSION: Gaseous distension of bowel loops particularly rectosigmoid colon since previous imaging. Findings may reflect developing global ileus. Distension of the rectosigmoid near 7 cm warrants close attention on follow-up. Correlate with abdominal symptoms. Signs of stent placement into the pancreatic duct. These results will be called to the ordering clinician or representative by the Radiologist Assistant, and communication documented in the PACS or [employer]. Electronically Signed   By: Zetta Bills M.D.   On: 08/08/2021 14:48   NM Hepatobiliary Liver Func  Result Date: 08/09/2021 CLINICAL DATA:  Choledocholithiasis. Prior cholecystectomy with small gallbladder remnant. EXAM: NUCLEAR MEDICINE HEPATOBILIARY IMAGING TECHNIQUE: Sequential images of the abdomen were obtained out to 60 minutes following intravenous administration of radiopharmaceutical. RADIOPHARMACEUTICALS:  5.3 mCi Tc-79m Choletec IV COMPARISON:  3 CT 08/06/2021, MRI 08/06/2021, ERCP 08/07/2021 FINDINGS: Prompt clearance radiotracer from the blood pool and homogeneous uptake in liver. Counts are evident in the common bile duct and small bowel by 20 minutes. No evidence of biliary obstruction. Small cystic duct remnant/gallbladder remnant is noted. No evidence of biliary leak. IMPRESSION: 1. Patent common bile duct.  No evidence of biliary obstruction. 2. Small cystic duct remnant/gallbladder remnant noted. Electronically Signed   By: SSuzy BouchardM.D.   On: 08/09/2021 11:37   CT ABDOMEN PELVIS W CONTRAST  Result Date: 08/05/2021 CLINICAL DATA:  Persistent vomiting with history of prior cholecystectomy. EXAM: CT ABDOMEN AND PELVIS WITH CONTRAST TECHNIQUE: Multidetector CT imaging of the abdomen and pelvis was performed using the standard protocol following bolus administration of intravenous contrast. CONTRAST:  566m OMNIPAQUE IOHEXOL 350 MG/ML SOLN COMPARISON:  None. FINDINGS: Lower chest: No acute abnormality. Hepatobiliary: No focal liver abnormality is seen. Status post cholecystectomy. 9 mm and 14 mm calcifications are seen within the inferior aspect of the gallbladder fossa. The common bile duct measures approximately 9 mm. Pancreas: Marked severity peripancreatic inflammatory fat stranding and peripancreatic fluid is seen. This extends to involve the left upper quadrant and mid and lower portions of the right abdomen. Diffuse enlargement of the pancreatic head and uncinate process is also noted (approximately 3.6 cm x 3.6 cm (axial CT image 35, CT series 2). Spleen: Normal in size without focal abnormality. Adrenals/Urinary Tract: A 2.4 cm x 1.9 cm well-defined low-attenuation (approximately 15.91 Hounsfield units) left adrenal mass is noted. The right adrenal gland is normal in size and appearance. Kidneys are normal, without renal calculi, focal lesion, or hydronephrosis. Bladder is unremarkable. Stomach/Bowel: There is a small hiatal hernia. Appendix appears normal. No evidence of bowel dilatation. Vascular/Lymphatic: Aortic atherosclerosis. No enlarged abdominal or pelvic lymph nodes. Reproductive: There is mild to moderate severity prostate gland enlargement. Other: No abdominal wall hernia or abnormality. No abdominopelvic ascites. Musculoskeletal: Multilevel degenerative changes are seen throughout the lumbar spine. IMPRESSION: 1. Marked severity acute pancreatitis with associated inflammatory changes. 2. Small retained gallstones within the gallbladder fossa, status post cholecystectomy. 3. Left adrenal mass which may represent an adrenal adenoma. 4. Small hiatal hernia. 5. Mild to moderate  severity prostate gland enlargement. 6. Aortic atherosclerosis. Aortic Atherosclerosis (ICD10-I70.0). Electronically Signed   By: Virgina Norfolk M.D.   On: 08/05/2021 18:18   MR 3D Recon At Scanner  Result Date:  08/06/2021 CLINICAL DATA:  Pancreatitis, jaundice, nausea, vomiting, history of cholecystectomy EXAM: MRI ABDOMEN WITHOUT AND WITH CONTRAST (INCLUDING MRCP) TECHNIQUE: Multiplanar multisequence MR imaging of the abdomen was performed both before and after the administration of intravenous contrast. Heavily T2-weighted images of the biliary and pancreatic ducts were obtained, and three-dimensional MRCP images were rendered by post processing. CONTRAST:  7.44m GADAVIST GADOBUTROL 1 MMOL/ML IV SOLN COMPARISON:  CT abdomen pelvis, 08/05/2021 FINDINGS: Lower chest: No acute findings. Hepatobiliary: No mass or other parenchymal abnormality identified. Status post cholecystectomy with a sizable gallbladder remnant in the gallbladder fossa containing multiple gallstones. There are two gallstones within the common bile duct in the pancreatic head, approximately 4 and 5 cm from the ampulla, measuring 5-6 mm each (series 6, image 15). There is severe intra and extrahepatic biliary ductal dilatation, the common bile duct measuring up to 1.5 cm in caliber. Pancreas: Very extensive, diffuse edema of the pancreatic parenchyma with adjacent peripancreatic fat stranding and fluid (series 3, image 20). There are areas of parenchymal hypoenhancement, particularly of the pancreatic head and neck (series 29, image 53, 37). No pancreatic ductal dilatation. No acute pancreatic fluid collection. Spleen:  Within normal limits in size and appearance. Adrenals/Urinary Tract: There is a definitively benign, fat containing left adrenal adenoma (series 9, image 27). No evidence of hydronephrosis. Stomach/Bowel: Visualized portions within the abdomen are unremarkable. Vascular/Lymphatic: No pathologically enlarged lymph nodes identified. No abdominal aortic aneurysm demonstrated. Other:  Small volume ascites. Musculoskeletal: No suspicious bone lesions identified. IMPRESSION: 1. Very extensive, diffuse edema of the pancreatic parenchyma with  adjacent peripancreatic fat stranding and fluid. There are areas of parenchymal hypoenhancement, particularly of the pancreatic head and neck, concerning for necrosis in the setting of severe acute pancreatitis. No evidence of acute pancreatic fluid collection. 2. Severe intra and extrahepatic biliary ductal dilatation, the common bile duct measuring up to 1.5 cm in caliber. Choledocholithiasis with two gallstones within the common bile duct in the pancreatic head measuring 5-6 mm each, approximately 4 and 5 cm from the ampulla. 3. Status post cholecystectomy with a sizable gallbladder remnant in the gallbladder fossa containing multiple gallstones. 4. Small volume ascites. These results will be called to the ordering clinician or representative by the Radiologist Assistant, and communication documented in the PACS or [employer] Electronically Signed   By: AEddie CandleM.D.   On: 08/06/2021 20:19   DG ERCP BILIARY & PANCREATIC DUCTS  Result Date: 08/08/2021 CLINICAL DATA:  Choledocholithiasis. EXAM: INTRAOPERATIVE CHOLANGIOGRAM TECHNIQUE: Cholangiographic images from the C-arm fluoroscopic device were submitted for interpretation post-operatively. Please see the procedural report for the amount of contrast and the fluoroscopy time utilized. COMPARISON:  CT Abdomen Pelvis, 08/05/2021. Abdominal ultrasound, 08/06/2021. MR abdomen, 08/06/2021. FINDINGS: Fluoroscopy Time:  7 minutes, 32 seconds Radiation Exposure Index (if provided by the fluoroscopic device): None provided. Number of Acquired Spot Images: 11. Multiple, limited oblique planar views of the RIGHT upper quadrant demonstrating a flexible endoscope, pancreatic duct cannulation and plastic stent placement common common bile duct cannulation, cholangiogram, balloon sweep, and plastic biliary stent placement. No discrete filling defect demonstrated. IMPRESSION: Intra procedural fluoroscopic imaging for ERCP, biliary and pancreatic duct stent  placements. Please see performing service dictation for complete description of intra procedural findings. Electronically Signed   By: JWille Glaser  Mugweru M.D.   On: 08/08/2021 07:47   MR ABDOMEN MRCP W WO CONTAST  Result Date: 08/06/2021 CLINICAL DATA:  Pancreatitis, jaundice, nausea, vomiting, history of cholecystectomy EXAM: MRI ABDOMEN WITHOUT AND WITH CONTRAST (INCLUDING MRCP) TECHNIQUE: Multiplanar multisequence MR imaging of the abdomen was performed both before and after the administration of intravenous contrast. Heavily T2-weighted images of the biliary and pancreatic ducts were obtained, and three-dimensional MRCP images were rendered by post processing. CONTRAST:  7.70m GADAVIST GADOBUTROL 1 MMOL/ML IV SOLN COMPARISON:  CT abdomen pelvis, 08/05/2021 FINDINGS: Lower chest: No acute findings. Hepatobiliary: No mass or other parenchymal abnormality identified. Status post cholecystectomy with a sizable gallbladder remnant in the gallbladder fossa containing multiple gallstones. There are two gallstones within the common bile duct in the pancreatic head, approximately 4 and 5 cm from the ampulla, measuring 5-6 mm each (series 6, image 15). There is severe intra and extrahepatic biliary ductal dilatation, the common bile duct measuring up to 1.5 cm in caliber. Pancreas: Very extensive, diffuse edema of the pancreatic parenchyma with adjacent peripancreatic fat stranding and fluid (series 3, image 20). There are areas of parenchymal hypoenhancement, particularly of the pancreatic head and neck (series 29, image 53, 37). No pancreatic ductal dilatation. No acute pancreatic fluid collection. Spleen:  Within normal limits in size and appearance. Adrenals/Urinary Tract: There is a definitively benign, fat containing left adrenal adenoma (series 9, image 27). No evidence of hydronephrosis. Stomach/Bowel: Visualized portions within the abdomen are unremarkable. Vascular/Lymphatic: No pathologically enlarged lymph nodes  identified. No abdominal aortic aneurysm demonstrated. Other:  Small volume ascites. Musculoskeletal: No suspicious bone lesions identified. IMPRESSION: 1. Very extensive, diffuse edema of the pancreatic parenchyma with adjacent peripancreatic fat stranding and fluid. There are areas of parenchymal hypoenhancement, particularly of the pancreatic head and neck, concerning for necrosis in the setting of severe acute pancreatitis. No evidence of acute pancreatic fluid collection. 2. Severe intra and extrahepatic biliary ductal dilatation, the common bile duct measuring up to 1.5 cm in caliber. Choledocholithiasis with two gallstones within the common bile duct in the pancreatic head measuring 5-6 mm each, approximately 4 and 5 cm from the ampulla. 3. Status post cholecystectomy with a sizable gallbladder remnant in the gallbladder fossa containing multiple gallstones. 4. Small volume ascites. These results will be called to the ordering clinician or representative by the Radiologist Assistant, and communication documented in the PACS or [employer] Electronically Signed   By: AEddie CandleM.D.   On: 08/06/2021 20:19   UKoreaAbdomen Limited RUQ (LIVER/GB)  Result Date: 08/06/2021 CLINICAL DATA:  Elevated serum total bilirubin levels. Prior cholecystectomy. EXAM: ULTRASOUND ABDOMEN LIMITED RIGHT UPPER QUADRANT COMPARISON:  January 17, 2021 FINDINGS: Gallbladder: The gallbladder is surgically absent. A 1.2 cm shadowing echogenic focus is noted within the gallbladder fossa. Common bile duct: Diameter: 15.1 mm Liver: No focal lesion identified. Within normal limits in parenchymal echogenicity. Portal vein is patent on color Doppler imaging with normal direction of blood flow towards the liver. Other: None. IMPRESSION: 1. Evidence of prior cholecystectomy with a retained gallstones suspected within the gallbladder fossa. Electronically Signed   By: TVirgina NorfolkM.D.   On: 08/06/2021 00:56   (Echo, Carotid, EGD,  Colonoscopy, ERCP)    Subjective: seen and examined. Uses translator . Discussed with Niece LMargarita Grizzle Patient tells me he has some bloating sensation but excited to go home.   Discharge Exam: Vitals:   08/11/21 2147 08/12/21 0645  BP: 122/78 (!) 143/78  Pulse: 61 (!) 53  Resp: 18 18  Temp: 98.7 F (37.1 C) 98.4 F (36.9 C)  SpO2: 97% 98%   Vitals:   08/11/21 0609 08/11/21 1413 08/11/21 2147 08/12/21 0645  BP: 106/68 113/79 122/78 (!) 143/78  Pulse: (!) 54 (!) 52 61 (!) 53  Resp: '18 17 18 18  '$ Temp: 99 F (37.2 C) 98.6 F (37 C) 98.7 F (37.1 C) 98.4 F (36.9 C)  TempSrc: Oral Oral Oral Oral  SpO2: 98% 97% 97% 98%  Weight:      Height:        General: Pt is alert, awake, not in acute distress Sitting in chair and eating. Cardiovascular: RRR, S1/S2 +, no rubs, no gallops Respiratory: CTA bilaterally, no wheezing, no rhonchi Abdominal: Soft, NT, ND, bowel sounds + Extremities: no edema, no cyanosis    The results of significant diagnostics from this hospitalization (including imaging, microbiology, ancillary and laboratory) are listed below for reference.     Microbiology: Recent Results (from the past 240 hour(s))  Resp Panel by RT-PCR (Flu A&B, Covid) Nasopharyngeal Swab     Status: None   Collection Time: 08/05/21  6:46 PM   Specimen: Nasopharyngeal Swab; Nasopharyngeal(NP) swabs in vial transport medium  Result Value Ref Range Status   SARS Coronavirus 2 by RT PCR NEGATIVE NEGATIVE Final    Comment: (NOTE) SARS-CoV-2 target nucleic acids are NOT DETECTED.  The SARS-CoV-2 RNA is generally detectable in upper respiratory specimens during the acute phase of infection. The lowest concentration of SARS-CoV-2 viral copies this assay can detect is 138 copies/mL. A negative result does not preclude SARS-Cov-2 infection and should not be used as the sole basis for treatment or other patient management decisions. A negative result may occur with  improper specimen  collection/handling, submission of specimen other than nasopharyngeal swab, presence of viral mutation(s) within the areas targeted by this assay, and inadequate number of viral copies(<138 copies/mL). A negative result must be combined with clinical observations, patient history, and epidemiological information. The expected result is Negative.  Fact Sheet for Patients:  EntrepreneurPulse.com.au  Fact Sheet for Healthcare Providers:  IncredibleEmployment.be  This test is no t yet approved or cleared by the [place] FDA and  has been authorized for detection and/or diagnosis of SARS-CoV-2 by FDA under an Emergency Use Authorization (EUA). This EUA will remain  in effect (meaning this test can be used) for the duration of the COVID-19 declaration under Section 564(b)(1) of the Act, 21 U.S.C.section 360bbb-3(b)(1), unless the authorization is terminated  or revoked sooner.       Influenza A by PCR NEGATIVE NEGATIVE Final   Influenza B by PCR NEGATIVE NEGATIVE Final    Comment: (NOTE) The Xpert Xpress SARS-CoV-2/FLU/RSV plus assay is intended as an aid in the diagnosis of influenza from Nasopharyngeal swab specimens and should not be used as a sole basis for treatment. Nasal washings and aspirates are unacceptable for Xpert Xpress SARS-CoV-2/FLU/RSV testing.  Fact Sheet for Patients: EntrepreneurPulse.com.au  Fact Sheet for Healthcare Providers: IncredibleEmployment.be  This test is not yet approved or cleared by the [place] FDA and has been authorized for detection and/or diagnosis of SARS-CoV-2 by FDA under an Emergency Use Authorization (EUA). This EUA will remain in effect (meaning this test can be used) for the duration of the COVID-19 declaration under Section 564(b)(1) of the Act, 21 U.S.C. section 360bbb-3(b)(1), unless the authorization is terminated or revoked.  Performed at [facility], [address], [place],  [address]   Culture,  blood (routine x 2)     Status: None (Preliminary result)   Collection Time: 08/07/21  6:57 PM   Specimen: BLOOD  Result Value Ref Range Status   Specimen Description   Final    BLOOD LEFT ANTECUBITAL Performed at [facility] [address] [place], [place] [address]    Special Requests   Final    BOTTLES DRAWN AEROBIC ONLY Blood Culture adequate volume Performed at [facility] [address], [place], [place] [address]    Culture   Final    NO GROWTH 4 DAYS Performed at [facility], [place] [address] [place], [place] [address]    Report Status PENDING  Incomplete  Culture, blood (routine x 2)     Status: None (Preliminary result)   Collection Time: 08/07/21  6:57 PM   Specimen: BLOOD  Result Value Ref Range Status   Specimen Description   Final    BLOOD RIGHT ANTECUBITAL Performed at [facility] [address], [place], [place] [address]    Special Requests   Final    BOTTLES DRAWN AEROBIC ONLY Blood Culture adequate volume Performed at [facility] [address] [place], [place] [address]    Culture   Final    NO GROWTH 4 DAYS Performed at [facility], [place] [address] [place], [place] [address]    Report Status PENDING  Incomplete     Labs: BNP (last 3 results) No results for input(s): BNP in the last 8760 hours. Basic Metabolic Panel: Recent Labs  Lab 08/06/21 0459 08/07/21 0420 08/08/21 0419 08/09/21 0434 08/11/21 0433  NA 137 137 138 139 136  K 4.2 4.2 4.1 3.6 3.7  CL 104 105 107 107 104  CO2 '25 26 27 28 27  '$ GLUCOSE 129* 101* 110* 87 102*  BUN '22 23 21 19 14  '$ CREATININE 0.86 0.74 0.78 0.67 0.59*  CALCIUM 9.1 8.7* 8.2* 8.0* 7.9*  MG  --   --   --   --  1.9  PHOS  --   --   --   --  2.1*   Liver Function Tests: Recent Labs  Lab 08/06/21 0459 08/07/21 0420 08/08/21 0419  08/09/21 0434 08/11/21 0433  AST 220* 61* 34 21 19  ALT 239* 128* 73* 51* 32  ALKPHOS 123 79 62 56 52  BILITOT 3.6* 1.9* 1.3* 1.2 0.9  PROT 6.6 5.8* 5.1* 5.1* 4.8*  ALBUMIN 3.9 3.1* 2.6* 2.5* 2.3*   Recent Labs  Lab 08/05/21 1632 08/06/21 0459 08/07/21 0420 08/08/21 0419  LIPASE 1,649* 771* 177* 73*   No results for input(s): AMMONIA in the last 168 hours. CBC: Recent Labs  Lab 08/05/21 1632 08/06/21 0459 08/07/21 0851 08/08/21 0419 08/09/21 0434 08/11/21 0433  WBC 13.2* 19.8* 23.6* 14.4* 10.7* 8.6  NEUTROABS 12.3*  --   --   --   --  6.8  HGB 14.2 14.9 14.3 11.5* 10.9* 11.1*  HCT 42.0 43.9 44.1 35.3* 32.6* 32.7*  MCV 97.7 98.0 102.1* 101.4* 99.7 98.8  PLT 153 144* 108* 82* 93* 124*   Cardiac Enzymes: No results for input(s): CKTOTAL, CKMB, CKMBINDEX, TROPONINI in the last 168 hours. BNP: Invalid input(s): POCBNP CBG: No results for input(s): GLUCAP in the last 168 hours. D-Dimer No results for input(s): DDIMER in the last 72 hours. Hgb A1c No results for input(s): HGBA1C in the last 72 hours. Lipid Profile No results for input(s): CHOL, HDL,  LDLCALC, TRIG, CHOLHDL, LDLDIRECT in the last 72 hours. Thyroid function studies No results for input(s): TSH, T4TOTAL, T3FREE, THYROIDAB in the last 72 hours.  Invalid input(s): FREET3 Anemia work up No results for input(s): VITAMINB12, FOLATE, FERRITIN, TIBC, IRON, RETICCTPCT in the last 72 hours. Urinalysis No results found for: COLORURINE, APPEARANCEUR, [place], [place], GLUCOSEU, [place], [place], [place], PROTEINUR, UROBILINOGEN, NITRITE, LEUKOCYTESUR Sepsis Labs Invalid input(s): PROCALCITONIN,  WBC,  LACTICIDVEN Microbiology Recent Results (from the past 240 hour(s))  Resp Panel by RT-PCR (Flu A&B, Covid) Nasopharyngeal Swab     Status: None   Collection Time: 08/05/21  6:46 PM   Specimen: Nasopharyngeal Swab; Nasopharyngeal(NP) swabs in vial transport medium  Result Value Ref Range Status   SARS  Coronavirus 2 by RT PCR NEGATIVE NEGATIVE Final    Comment: (NOTE) SARS-CoV-2 target nucleic acids are NOT DETECTED.  The SARS-CoV-2 RNA is generally detectable in upper respiratory specimens during the acute phase of infection. The lowest concentration of SARS-CoV-2 viral copies this assay can detect is 138 copies/mL. A negative result does not preclude SARS-Cov-2 infection and should not be used as the sole basis for treatment or other patient management decisions. A negative result may occur with  improper specimen collection/handling, submission of specimen other than nasopharyngeal swab, presence of viral mutation(s) within the areas targeted by this assay, and inadequate number of viral copies(<138 copies/mL). A negative result must be combined with clinical observations, patient history, and epidemiological information. The expected result is Negative.  Fact Sheet for Patients:  EntrepreneurPulse.com.au  Fact Sheet for Healthcare Providers:  IncredibleEmployment.be  This test is no t yet approved or cleared by the [place] FDA and  has been authorized for detection and/or diagnosis of SARS-CoV-2 by FDA under an Emergency Use Authorization (EUA). This EUA will remain  in effect (meaning this test can be used) for the duration of the COVID-19 declaration under Section 564(b)(1) of the Act, 21 U.S.C.section 360bbb-3(b)(1), unless the authorization is terminated  or revoked sooner.       Influenza A by PCR NEGATIVE NEGATIVE Final   Influenza B by PCR NEGATIVE NEGATIVE Final    Comment: (NOTE) The Xpert Xpress SARS-CoV-2/FLU/RSV plus assay is intended as an aid in the diagnosis of influenza from Nasopharyngeal swab specimens and should not be used as a sole basis for treatment. Nasal washings and aspirates are unacceptable for Xpert Xpress SARS-CoV-2/FLU/RSV testing.  Fact Sheet for  Patients: EntrepreneurPulse.com.au  Fact Sheet for Healthcare Providers: IncredibleEmployment.be  This test is not yet approved or cleared by the [place] FDA and has been authorized for detection and/or diagnosis of SARS-CoV-2 by FDA under an Emergency Use Authorization (EUA). This EUA will remain in effect (meaning this test can be used) for the duration of the COVID-19 declaration under Section 564(b)(1) of the Act, 21 U.S.C. section 360bbb-3(b)(1), unless the authorization is terminated or revoked.  Performed at [facility], [address] [place], [place] [address]   Culture, blood (routine x 2)     Status: None (Preliminary result)   Collection Time: 08/07/21  6:57 PM   Specimen: BLOOD  Result Value Ref Range Status   Specimen Description   Final    BLOOD LEFT ANTECUBITAL Performed at [facility] [address], [place], [place] [address]    Special Requests   Final    BOTTLES DRAWN AEROBIC ONLY Blood Culture adequate volume Performed at [facility] [address] [place], [place] [address]  Culture   Final    NO GROWTH 4 DAYS Performed at [facility], [place] [address], [place], [place] [address]    Report Status PENDING  Incomplete  Culture, blood (routine x 2)     Status: None (Preliminary result)   Collection Time: 08/07/21  6:57 PM   Specimen: BLOOD  Result Value Ref Range Status   Specimen Description   Final    BLOOD RIGHT ANTECUBITAL Performed at [facility] [address] [place], [place] [address]    Special Requests   Final    BOTTLES DRAWN AEROBIC ONLY Blood Culture adequate volume Performed at [facility] [address] [place], [place] [address]    Culture   Final    NO GROWTH 4 DAYS Performed at [facility], [place] [address], [place],  [address]    Report Status PENDING  Incomplete      Time coordinating discharge:  35 minutes  SIGNED:   Barb Merino, MD  [facility] 08/12/2021, 11:09 AM

## 2021-08-13 LAB — CULTURE, BLOOD (ROUTINE X 2)
Culture: NO GROWTH
Culture: NO GROWTH
Special Requests: ADEQUATE
Special Requests: ADEQUATE

## 2021-08-15 ENCOUNTER — Other Ambulatory Visit: Payer: Self-pay | Admitting: Gastroenterology

## 2021-08-15 DIAGNOSIS — K8581 Other acute pancreatitis with uninfected necrosis: Secondary | ICD-10-CM

## 2021-08-21 DIAGNOSIS — I10 Essential (primary) hypertension: Secondary | ICD-10-CM | POA: Diagnosis not present

## 2021-08-21 DIAGNOSIS — K8591 Acute pancreatitis with uninfected necrosis, unspecified: Secondary | ICD-10-CM | POA: Diagnosis not present

## 2021-08-21 DIAGNOSIS — K851 Biliary acute pancreatitis without necrosis or infection: Secondary | ICD-10-CM | POA: Diagnosis not present

## 2021-08-21 DIAGNOSIS — K5900 Constipation, unspecified: Secondary | ICD-10-CM | POA: Diagnosis not present

## 2021-08-24 ENCOUNTER — Emergency Department (HOSPITAL_COMMUNITY): Payer: Medicare Other

## 2021-08-24 ENCOUNTER — Encounter (HOSPITAL_COMMUNITY): Payer: Self-pay

## 2021-08-24 ENCOUNTER — Inpatient Hospital Stay (HOSPITAL_COMMUNITY)
Admission: EM | Admit: 2021-08-24 | Discharge: 2021-09-22 | DRG: 438 | Disposition: A | Discharge status: Home Health | Payer: Medicare Other | Attending: Internal Medicine | Admitting: Internal Medicine

## 2021-08-24 ENCOUNTER — Observation Stay (HOSPITAL_COMMUNITY): Payer: Medicare Other

## 2021-08-24 ENCOUNTER — Other Ambulatory Visit: Payer: Self-pay

## 2021-08-24 DIAGNOSIS — R109 Unspecified abdominal pain: Secondary | ICD-10-CM | POA: Diagnosis not present

## 2021-08-24 DIAGNOSIS — Z885 Allergy status to narcotic agent status: Secondary | ICD-10-CM | POA: Diagnosis not present

## 2021-08-24 DIAGNOSIS — I10 Essential (primary) hypertension: Secondary | ICD-10-CM | POA: Diagnosis present

## 2021-08-24 DIAGNOSIS — H913 Deaf nonspeaking, not elsewhere classified: Secondary | ICD-10-CM | POA: Diagnosis not present

## 2021-08-24 DIAGNOSIS — E222 Syndrome of inappropriate secretion of antidiuretic hormone: Secondary | ICD-10-CM | POA: Diagnosis present

## 2021-08-24 DIAGNOSIS — H9190 Unspecified hearing loss, unspecified ear: Secondary | ICD-10-CM | POA: Diagnosis not present

## 2021-08-24 DIAGNOSIS — D649 Anemia, unspecified: Secondary | ICD-10-CM | POA: Diagnosis present

## 2021-08-24 DIAGNOSIS — I82891 Chronic embolism and thrombosis of other specified veins: Secondary | ICD-10-CM | POA: Diagnosis not present

## 2021-08-24 DIAGNOSIS — Z6821 Body mass index (BMI) 21.0-21.9, adult: Secondary | ICD-10-CM

## 2021-08-24 DIAGNOSIS — Z20822 Contact with and (suspected) exposure to covid-19: Secondary | ICD-10-CM | POA: Diagnosis present

## 2021-08-24 DIAGNOSIS — E871 Hypo-osmolality and hyponatremia: Secondary | ICD-10-CM | POA: Diagnosis present

## 2021-08-24 DIAGNOSIS — Z9049 Acquired absence of other specified parts of digestive tract: Secondary | ICD-10-CM | POA: Diagnosis not present

## 2021-08-24 DIAGNOSIS — K315 Obstruction of duodenum: Secondary | ICD-10-CM

## 2021-08-24 DIAGNOSIS — Z4659 Encounter for fitting and adjustment of other gastrointestinal appliance and device: Secondary | ICD-10-CM

## 2021-08-24 DIAGNOSIS — K8591 Acute pancreatitis with uninfected necrosis, unspecified: Principal | ICD-10-CM

## 2021-08-24 DIAGNOSIS — D735 Infarction of spleen: Secondary | ICD-10-CM | POA: Diagnosis not present

## 2021-08-24 DIAGNOSIS — E861 Hypovolemia: Secondary | ICD-10-CM | POA: Diagnosis present

## 2021-08-24 DIAGNOSIS — J90 Pleural effusion, not elsewhere classified: Secondary | ICD-10-CM | POA: Diagnosis not present

## 2021-08-24 DIAGNOSIS — E43 Unspecified severe protein-calorie malnutrition: Secondary | ICD-10-CM | POA: Diagnosis not present

## 2021-08-24 DIAGNOSIS — Z79899 Other long term (current) drug therapy: Secondary | ICD-10-CM

## 2021-08-24 DIAGNOSIS — R1310 Dysphagia, unspecified: Secondary | ICD-10-CM | POA: Diagnosis not present

## 2021-08-24 DIAGNOSIS — K2980 Duodenitis without bleeding: Secondary | ICD-10-CM | POA: Diagnosis not present

## 2021-08-24 DIAGNOSIS — R809 Proteinuria, unspecified: Secondary | ICD-10-CM | POA: Diagnosis present

## 2021-08-24 DIAGNOSIS — K5900 Constipation, unspecified: Secondary | ICD-10-CM | POA: Diagnosis not present

## 2021-08-24 DIAGNOSIS — K863 Pseudocyst of pancreas: Secondary | ICD-10-CM | POA: Diagnosis not present

## 2021-08-24 DIAGNOSIS — R824 Acetonuria: Secondary | ICD-10-CM | POA: Diagnosis not present

## 2021-08-24 DIAGNOSIS — R188 Other ascites: Secondary | ICD-10-CM

## 2021-08-24 DIAGNOSIS — E8809 Other disorders of plasma-protein metabolism, not elsewhere classified: Secondary | ICD-10-CM | POA: Diagnosis not present

## 2021-08-24 DIAGNOSIS — K859 Acute pancreatitis without necrosis or infection, unspecified: Secondary | ICD-10-CM

## 2021-08-24 DIAGNOSIS — E441 Mild protein-calorie malnutrition: Secondary | ICD-10-CM | POA: Diagnosis present

## 2021-08-24 DIAGNOSIS — R1110 Vomiting, unspecified: Secondary | ICD-10-CM | POA: Diagnosis not present

## 2021-08-24 DIAGNOSIS — I96 Gangrene, not elsewhere classified: Secondary | ICD-10-CM

## 2021-08-24 DIAGNOSIS — Z0189 Encounter for other specified special examinations: Secondary | ICD-10-CM

## 2021-08-24 DIAGNOSIS — I81 Portal vein thrombosis: Secondary | ICD-10-CM | POA: Diagnosis not present

## 2021-08-24 DIAGNOSIS — Z8249 Family history of ischemic heart disease and other diseases of the circulatory system: Secondary | ICD-10-CM

## 2021-08-24 DIAGNOSIS — I82890 Acute embolism and thrombosis of other specified veins: Secondary | ICD-10-CM

## 2021-08-24 DIAGNOSIS — Q8909 Congenital malformations of spleen: Secondary | ICD-10-CM | POA: Diagnosis not present

## 2021-08-24 DIAGNOSIS — Z978 Presence of other specified devices: Secondary | ICD-10-CM

## 2021-08-24 HISTORY — DX: Unspecified hearing loss, unspecified ear: H91.90

## 2021-08-24 HISTORY — DX: Acute pancreatitis without necrosis or infection, unspecified: K85.90

## 2021-08-24 LAB — URINALYSIS, ROUTINE W REFLEX MICROSCOPIC
Bacteria, UA: NONE SEEN
Bilirubin Urine: NEGATIVE
Glucose, UA: NEGATIVE mg/dL
Hgb urine dipstick: NEGATIVE
Ketones, ur: 5 mg/dL — AB
Leukocytes,Ua: NEGATIVE
Nitrite: NEGATIVE
Protein, ur: 30 mg/dL — AB
Specific Gravity, Urine: 1.027 (ref 1.005–1.030)
pH: 5 (ref 5.0–8.0)

## 2021-08-24 LAB — CBC WITH DIFFERENTIAL/PLATELET
Abs Immature Granulocytes: 0.1 10*3/uL — ABNORMAL HIGH (ref 0.00–0.07)
Basophils Absolute: 0 10*3/uL (ref 0.0–0.1)
Basophils Relative: 0 %
Eosinophils Absolute: 0 10*3/uL (ref 0.0–0.5)
Eosinophils Relative: 0 %
HCT: 36.5 % — ABNORMAL LOW (ref 39.0–52.0)
Hemoglobin: 11.9 g/dL — ABNORMAL LOW (ref 13.0–17.0)
Immature Granulocytes: 1 %
Lymphocytes Relative: 6 %
Lymphs Abs: 0.9 10*3/uL (ref 0.7–4.0)
MCH: 32.3 pg (ref 26.0–34.0)
MCHC: 32.6 g/dL (ref 30.0–36.0)
MCV: 99.2 fL (ref 80.0–100.0)
Monocytes Absolute: 0.9 10*3/uL (ref 0.1–1.0)
Monocytes Relative: 7 %
Neutro Abs: 12 10*3/uL — ABNORMAL HIGH (ref 1.7–7.7)
Neutrophils Relative %: 86 %
Platelets: 223 10*3/uL (ref 150–400)
RBC: 3.68 MIL/uL — ABNORMAL LOW (ref 4.22–5.81)
RDW: 12.7 % (ref 11.5–15.5)
WBC: 14 10*3/uL — ABNORMAL HIGH (ref 4.0–10.5)
nRBC: 0 % (ref 0.0–0.2)

## 2021-08-24 LAB — COMPREHENSIVE METABOLIC PANEL
ALT: 32 U/L (ref 0–44)
AST: 28 U/L (ref 15–41)
Albumin: 3 g/dL — ABNORMAL LOW (ref 3.5–5.0)
Alkaline Phosphatase: 82 U/L (ref 38–126)
Anion gap: 7 (ref 5–15)
BUN: 11 mg/dL (ref 8–23)
CO2: 26 mmol/L (ref 22–32)
Calcium: 8.7 mg/dL — ABNORMAL LOW (ref 8.9–10.3)
Chloride: 99 mmol/L (ref 98–111)
Creatinine, Ser: 0.72 mg/dL (ref 0.61–1.24)
GFR, Estimated: >60 mL/min (ref >60)
Glucose, Bld: 128 mg/dL — ABNORMAL HIGH (ref 70–99)
Potassium: 4.1 mmol/L (ref 3.5–5.1)
Sodium: 132 mmol/L — ABNORMAL LOW (ref 135–145)
Total Bilirubin: 1.1 mg/dL (ref 0.3–1.2)
Total Protein: 7.1 g/dL (ref 6.5–8.1)

## 2021-08-24 LAB — RESP PANEL BY RT-PCR (FLU A&B, COVID) ARPGX2
Influenza A by PCR: NEGATIVE
Influenza B by PCR: NEGATIVE
SARS Coronavirus 2 by RT PCR: NEGATIVE

## 2021-08-24 LAB — LIPASE, BLOOD: Lipase: 32 U/L (ref 11–51)

## 2021-08-24 MED ORDER — LACTATED RINGERS IV BOLUS
1000.0000 mL | Freq: Once | INTRAVENOUS | Status: AC
  Administered 2021-08-25: 1000 mL via INTRAVENOUS

## 2021-08-24 MED ORDER — PIPERACILLIN-TAZOBACTAM 3.375 G IVPB
3.3750 g | Freq: Three times a day (TID) | INTRAVENOUS | Status: AC
Start: 2021-08-25 — End: 2021-09-08
  Administered 2021-08-25 – 2021-09-07 (×42): 3.375 g via INTRAVENOUS
  Filled 2021-08-24 (×43): qty 50

## 2021-08-24 MED ORDER — LACTATED RINGERS IV SOLN: INTRAVENOUS | Status: AC

## 2021-08-24 MED ORDER — ACETAMINOPHEN 325 MG PO TABS: 650.0000 mg | ORAL_TABLET | Freq: Four times a day (QID) | ORAL | Status: DC | PRN

## 2021-08-24 MED ORDER — HEPARIN BOLUS VIA INFUSION
2300.0000 [IU] | Freq: Once | INTRAVENOUS | Status: AC
  Administered 2021-08-24: 2300 [IU] via INTRAVENOUS
  Filled 2021-08-24: qty 2300

## 2021-08-24 MED ORDER — ONDANSETRON HCL 4 MG PO TABS
4.0000 mg | ORAL_TABLET | Freq: Four times a day (QID) | ORAL | Status: DC | PRN
  Administered 2021-09-14 (×3): 4 mg via ORAL
  Filled 2021-08-24 (×3): qty 1

## 2021-08-24 MED ORDER — ONDANSETRON HCL 4 MG/2ML IJ SOLN
4.0000 mg | Freq: Four times a day (QID) | INTRAMUSCULAR | Status: DC | PRN
Start: 2021-08-24 — End: 2021-08-24

## 2021-08-24 MED ORDER — LACTATED RINGERS IV BOLUS
1000.0000 mL | Freq: Once | INTRAVENOUS | Status: AC
  Administered 2021-08-24: 1000 mL via INTRAVENOUS

## 2021-08-24 MED ORDER — PIPERACILLIN-TAZOBACTAM 3.375 G IVPB 30 MIN
3.3750 g | Freq: Once | INTRAVENOUS | Status: AC
  Administered 2021-08-24: 3.375 g via INTRAVENOUS
  Filled 2021-08-24: qty 50

## 2021-08-24 MED ORDER — ONDANSETRON HCL 4 MG/2ML IJ SOLN
4.0000 mg | Freq: Four times a day (QID) | INTRAMUSCULAR | Status: DC | PRN
  Administered 2021-08-25 – 2021-09-19 (×59): 4 mg via INTRAVENOUS
  Filled 2021-08-24 (×62): qty 2

## 2021-08-24 MED ORDER — IOHEXOL 350 MG/ML SOLN
100.0000 mL | Freq: Once | INTRAVENOUS | Status: AC | PRN
  Administered 2021-08-24: 100 mL via INTRAVENOUS

## 2021-08-24 MED ORDER — HYDROMORPHONE HCL 1 MG/ML IJ SOLN
1.0000 mg | INTRAMUSCULAR | Status: DC | PRN
  Administered 2021-08-25 – 2021-09-22 (×117): 1 mg via INTRAVENOUS
  Filled 2021-08-24 (×120): qty 1

## 2021-08-24 MED ORDER — ACETAMINOPHEN 650 MG RE SUPP: 650.0000 mg | Freq: Four times a day (QID) | RECTAL | Status: DC | PRN

## 2021-08-24 MED ORDER — PANTOPRAZOLE SODIUM 40 MG IV SOLR
40.0000 mg | INTRAVENOUS | Status: DC
  Administered 2021-08-24 – 2021-09-21 (×29): 40 mg via INTRAVENOUS
  Filled 2021-08-24 (×29): qty 40

## 2021-08-24 MED ORDER — PIPERACILLIN-TAZOBACTAM 3.375 G IVPB 30 MIN: 3.3750 g | Freq: Three times a day (TID) | INTRAVENOUS | Status: DC

## 2021-08-24 MED ORDER — ONDANSETRON HCL 4 MG/2ML IJ SOLN
4.0000 mg | Freq: Once | INTRAMUSCULAR | Status: AC
  Administered 2021-08-24: 4 mg via INTRAVENOUS
  Filled 2021-08-24: qty 2

## 2021-08-24 MED ORDER — HEPARIN (PORCINE) 25000 UT/250ML-% IV SOLN
1500.0000 [IU]/h | INTRAVENOUS | Status: DC
  Administered 2021-08-24: 1300 [IU]/h via INTRAVENOUS
  Filled 2021-08-24: qty 250

## 2021-08-24 MED ORDER — HYDROMORPHONE HCL 1 MG/ML IJ SOLN
1.0000 mg | Freq: Once | INTRAMUSCULAR | Status: AC
  Administered 2021-08-24: 1 mg via INTRAVENOUS
  Filled 2021-08-24: qty 1

## 2021-08-24 NOTE — ED Provider Notes (Signed)
[facility] DEPT Provider Note   CSN: [id number] Arrival date & time: 08/24/21  1244     History Chief Complaint  Patient presents with   Fever   Emesis   Abdominal Pain    Hunter Ward is a 70 y.o. male who is notably deaf, with a past medical history significant for remote cholecystectomy, cholelithiasis, choledocholithiasis, who was recently admitted for pancreatitis and underwent ERCP with stent placement and stone removal, was discharged around 1 week ago who presents with abdominal pain, nausea, vomiting.  Patient endorses abdominal pain primarily in the left upper quadrant, he also endorses some diarrhea but was recently constipated after his hospitalization.  Reports his primary symptom is that he has had some significant nausea and vomiting since he was released from his hospitalization, but is also having some generalized abdominal pain and discomfort.  He is having some significant loss of appetite in addition to the other symptoms discussed above.  ASL interpreter services were used during our interview today.   Fever Associated symptoms: diarrhea, nausea and vomiting   Associated symptoms: no dysuria   Emesis Associated symptoms: abdominal pain, diarrhea and fever   Abdominal Pain Associated symptoms: diarrhea, fever, hematuria, nausea and vomiting   Associated symptoms: no dysuria       Past Medical History:  Diagnosis Date   Deaf    Hypertension    Pancreatitis     Patient Active Problem List   Diagnosis Date Noted   Acute pancreatitis 08/05/2021   HTN (hypertension) 08/05/2021   Serum total bilirubin elevated 08/05/2021   Transaminitis 08/05/2021    Past Surgical History:  Procedure Laterality Date   CHOLECYSTECTOMY     ENDOSCOPIC RETROGRADE CHOLANGIOPANCREATOGRAPHY (ERCP) WITH PROPOFOL N/A 08/07/2021   Procedure: ENDOSCOPIC RETROGRADE CHOLANGIOPANCREATOGRAPHY (ERCP) WITH PROPOFOL;  Surgeon: Ronnette Juniper, MD;  Location:  [facility];  Service: Gastroenterology;  Laterality: N/A;   JOINT REPLACEMENT Bilateral    PANCREATIC STENT PLACEMENT  08/07/2021   Procedure: PANCREATIC STENT PLACEMENT;  Surgeon: Ronnette Juniper, MD;  Location: [facility];  Service: Gastroenterology;;   REMOVAL OF STONES  08/07/2021   Procedure: REMOVAL OF STONES;  Surgeon: Ronnette Juniper, MD;  Location: [facility];  Service: Gastroenterology;;   Joan Mayans  08/07/2021   Procedure: Joan Mayans;  Surgeon: Ronnette Juniper, MD;  Location: [facility];  Service: Gastroenterology;;       History reviewed. No pertinent family history.  Social History   Tobacco Use   Smoking status: Never   Smokeless tobacco: Never  Vaping Use   Vaping Use: Never used  Substance Use Topics   Alcohol use: Never   Drug use: Never    Home Medications Prior to Admission medications   Medication Sig Start Date End Date Taking? Authorizing Provider  docusate sodium (COLACE) 100 MG capsule Take 1 capsule (100 mg total) by mouth 2 (two) times daily. 08/12/21   Barb Merino, MD  lisinopril (ZESTRIL) 20 MG tablet Take 20 mg by mouth daily. 05/29/21   [provider]  NIFEdipine (PROCARDIA-XL/NIFEDICAL-XL) 30 MG 24 hr tablet Take 30 mg by mouth daily. 05/29/21   [provider]  pantoprazole (PROTONIX) 40 MG tablet Take 1 tablet (40 mg total) by mouth daily. 08/12/21 09/11/21  Barb Merino, MD    Allergies    Codeine  Review of Systems   Review of Systems  Constitutional:  Positive for appetite change and fever.  Gastrointestinal:  Positive for abdominal pain, diarrhea, nausea and vomiting.  Genitourinary:  Positive for  hematuria. Negative for dysuria.  All other systems reviewed and are negative.  Physical Exam Updated Vital Signs BP 107/62 (BP Location: Right Arm)   Pulse 70   Temp 98.8 F (37.1 C) (Oral)   Resp 17   Ht '6\' 1"'$  (1.854 m)   Wt 73.9 kg   SpO2 95%   BMI 21.49 kg/m   Physical Exam Vitals and nursing note  reviewed.  Constitutional:      General: He is not in acute distress.    Appearance: Normal appearance. He is not diaphoretic.  HENT:     Head: Normocephalic and atraumatic.  Eyes:     General:        Right eye: No discharge.        Left eye: No discharge.  Cardiovascular:     Rate and Rhythm: Normal rate and regular rhythm.     Heart sounds: No murmur heard.   No friction rub. No gallop.  Pulmonary:     Effort: Pulmonary effort is normal.     Breath sounds: Normal breath sounds.  Abdominal:     General: Bowel sounds are normal.     Palpations: Abdomen is soft.     Comments: TTP in LUQ, epigastrium. Some diffuse tenderness across entire abdomen. No rebound rigidity or guarding. Abdomen is soft. No suprapubic or flank tenderness.  Skin:    General: Skin is warm and dry.     Capillary Refill: Capillary refill takes less than 2 seconds.  Neurological:     Mental Status: He is alert and oriented to person, place, and time.  Psychiatric:        Mood and Affect: Mood normal.        Behavior: Behavior normal.    ED Results / Procedures / Treatments   Labs (all labs ordered are listed, but only abnormal results are displayed) Labs Reviewed  COMPREHENSIVE METABOLIC PANEL - Abnormal; Notable for the following components:      Result Value   Sodium 132 (*)    Glucose, Bld 128 (*)    Calcium 8.7 (*)    Albumin 3.0 (*)    All other components within normal limits  CBC WITH DIFFERENTIAL/PLATELET - Abnormal; Notable for the following components:   WBC 14.0 (*)    RBC 3.68 (*)    Hemoglobin 11.9 (*)    HCT 36.5 (*)    Neutro Abs 12.0 (*)    Abs Immature Granulocytes 0.10 (*)    All other components within normal limits  RESP PANEL BY RT-PCR (FLU A&B, COVID) ARPGX2  LIPASE, BLOOD  URINALYSIS, ROUTINE W REFLEX MICROSCOPIC    EKG None  Radiology No results found.  Procedures Procedures   Medications Ordered in ED Medications  ondansetron (ZOFRAN) injection 4 mg (has no  administration in time range)    ED Course  I have reviewed the triage vital signs and the nursing notes.  Pertinent labs & imaging results that were available during my care of the patient were reviewed by me and considered in my medical decision making (see chart for details).  Clinical Course as of 08/24/21 1552  Thu Aug 24, 2021  1533 Just Dc after ERCP pancreatic stent, FU on imaging, N/ Abd pain. Hematuria today. If CT good, FU with GI [BH]    Clinical Course User Index [BH] Henderly, Britni A, PA-C   MDM Rules/Calculators/A&P  Patient was just discharged on 8/29 following ERCP for pancreatic stent placement and removal of stones. Some of abdominal pain, nausea, vomiting may be related to post-surgical pain and complications. Given recent surgery, fever to 100.9 last night, and risk for intra-abdominal obstruction / other abnormality in context of multiple previous abdominal surgeries, will order CT to evaluate abdominal pain. Patient passing gas and stool spontaneously and without any abdominal rigidity at this time.  Patient also has painless hematuria today without abnormality of creatinine or kidney function.   3:52 PM Care of Hunter Ward  transferred to [facility] and Dr. Godfrey Pick at the end of my shift as the patient will require reassessment once labs/imaging have resulted. Patient presentation, ED course, and plan of care discussed with review of all pertinent labs and imaging. Please see his/her note for further details regarding further ED course and disposition. Plan at time of handoff is if CT without abnormality, discharge with medications for pain and nausea control, and plan to follow up with PCP/urology regarding painless hematuria. Patient in stable condition, no acute distress at time of handoff.This may be altered or completely changed at the discretion of the oncoming team pending results of further workup.  Final Clinical  Impression(s) / ED Diagnoses Final diagnoses:  None    Rx / DC Orders ED Discharge Orders     None        Dorien Chihuahua 08/24/21 1556    Malvin Johns, MD 08/30/21 1027

## 2021-08-24 NOTE — ED Notes (Signed)
CT notified and aware x3 that pt has 20g IV access in the left AC.

## 2021-08-24 NOTE — H&P (Signed)
History and Physical    Hunter Ward [id number] DOB: [birth year] DOA: 08/24/2021  PCP: Gaynelle Arabian, MD  Patient coming from: Home.  I have personally briefly reviewed patient's old medical records in [facility]  Chief Complaint: Abdominal pain, nausea and vomiting.  HPI: Hunter Ward is a 70 year old male with a past medical history of hypertension, prior pancreatitis, cholecystectomy 30 years ago who was admitted from 08/05/2021 until 08/12/2021 due to acute severe gallstone pancreatitis who is coming in today due to abdominal pain associated with at least 5 episodes of emesis and at least an episode of loose stools earlier today after he was constipated and was put on medications..  The patient's sister stated he was doing well the first week after he was discharged, but in the last few days the patient's appetite has significantly decreased, he has been nauseous and malaised.  No fever or chills to their knowledge.  He signaled no headaches, chest or back pain.  History was limited by the patient's deafness and information was taken with help from his sister.  ED Course: Initial vital signs were temperature 98.8 F, pulse 70, respirations 17, BP 107/62 mmHg O2 sat 95% on room air.  The patient received 3.375 g of Zosyn, ondansetron 4 mg IVP and hydromorphone 1 mg IVP.  I added LR boluses for a total of 3000 mL, heparin per pharmacy, pantoprazole, hydromorphone 1 mg IVP x1 and ondansetron 4 mg IVP x1.  Lab work: Urinalysis was turbid with ketonuria 05 and proteinuria 30 mg/dL.  CBC showed a white count of 14.0 with 86% neutrophils, hemoglobin 11.9 g/dL and platelets 223.  Lipase was 32.  Coronavirus and influenza PCR was negative.  CMP showed a glucose of 128 mg/dL, sodium of 132 mmol/L, all other electrolytes are normal when calcium is corrected to an albumin of 3.0 g/dL.  Lipase was 32 units/L.  Imaging: CT abdomen/pelvis with contrast show progression necrotizing pancreatitis  as above, with 8.0 x 5.2 cm suspected abscess replacing the pancreatic head.  Multilocular fluid collections along the pancreatic body may reflect further necrotic changes versus organizing pseudocyst.  There is only minimal normal [place] pancreatic tissue identified.  Suspicion for splenic vein thrombosis with extrinsic compression on the superior mesenteric vein by the pancreatic abscess.  Portal vein remains patent.  There is stable, hepatic lobe dilation.  Persistent gallstone within the gallbladder remnant with no evidence of downstream choledocholithiasis.  Indwelling pancreatic duct stent extending into the duodenal lumen.  Trace pelvic free fluid.  Small bilateral pleural effusions.  Aortic atherosclerosis.  Please see images and full radiology report for further details.  Review of Systems: As per HPI otherwise all other systems reviewed and are negative.  Past Medical History:  Diagnosis Date   Deaf    Hypertension    Pancreatitis    Past Surgical History:  Procedure Laterality Date   CHOLECYSTECTOMY     ENDOSCOPIC RETROGRADE CHOLANGIOPANCREATOGRAPHY (ERCP) WITH PROPOFOL N/A 08/07/2021   Procedure: ENDOSCOPIC RETROGRADE CHOLANGIOPANCREATOGRAPHY (ERCP) WITH PROPOFOL;  Surgeon: Ronnette Juniper, MD;  Location: [facility];  Service: Gastroenterology;  Laterality: N/A;   JOINT REPLACEMENT Bilateral    PANCREATIC STENT PLACEMENT  08/07/2021   Procedure: PANCREATIC STENT PLACEMENT;  Surgeon: Ronnette Juniper, MD;  Location: [facility];  Service: Gastroenterology;;   REMOVAL OF STONES  08/07/2021   Procedure: REMOVAL OF STONES;  Surgeon: Ronnette Juniper, MD;  Location: [facility];  Service: Gastroenterology;;   Hunter Ward  08/07/2021   Procedure: SPHINCTEROTOMY;  Surgeon:  Ronnette Juniper, MD;  Location: [facility];  Service: Gastroenterology;;   Social History  reports that he has never smoked. He has never used smokeless tobacco. He reports that he does not drink alcohol and does not use  drugs.  Allergies  Allergen Reactions   Codeine Itching and Nausea And Vomiting   Family History  Problem Relation Age of Onset   Hypertension Other    Prior to Admission medications   Medication Sig Start Date End Date Taking? Authorizing Provider  docusate sodium (COLACE) 100 MG capsule Take 1 capsule (100 mg total) by mouth 2 (two) times daily. 08/12/21   Barb Merino, MD  lisinopril (ZESTRIL) 20 MG tablet Take 20 mg by mouth daily. 05/29/21   [provider]  NIFEdipine (PROCARDIA-XL/NIFEDICAL-XL) 30 MG 24 hr tablet Take 30 mg by mouth daily. 05/29/21   [provider]  pantoprazole (PROTONIX) 40 MG tablet Take 1 tablet (40 mg total) by mouth daily. 08/12/21 09/11/21  Barb Merino, MD   Physical Exam: Vitals:   08/24/21 1800 08/24/21 1852 08/24/21 1900 08/24/21 2000  BP: 100/66 (!) 105/59 100/64 (!) 97/59  Pulse: 73 71 75 72  Resp: '18 20 18 19  '$ Temp:      TempSrc:      SpO2: 99% 100% 99% 95%  Weight:      Height:       Constitutional: Chronically ill-appearing.  NAD, calm, comfortable Eyes: PERRL, lids and conjunctivae normal.  Conjunctivitis injected. ENMT: Mucous membranes are mildly dry.  Posterior pharynx clear of any exudate or lesions.  Dentition was absent. Neck: normal, supple, no masses, no thyromegaly Respiratory: clear to auscultation bilaterally, no wheezing, no crackles. Normal respiratory effort. No accessory muscle use.  Cardiovascular: Regular rate and rhythm, no murmurs / rubs / gallops. No extremity edema. 2+ pedal pulses. No carotid bruits.  Abdomen: No distention.  Bowel sounds positive.  Positive epigastric and LUQ tenderness, no guarding or rebound, no masses palpated. No hepatosplenomegaly. Bowel sounds positive.  Musculoskeletal: Mild generalized weakness.  No clubbing / cyanosis. Good ROM, no contractures. Normal muscle tone.  Skin: no acute rashes, lesions, ulcers on very limited otological examination. Neurologic: CN 2-12 grossly  intact. Sensation intact, DTR normal. Strength 5/5 in all 4.  Psychiatric: Normal judgment and insight. Alert and oriented x 3. Normal mood.   Labs on Admission: I have personally reviewed following labs and imaging studies  CBC: Recent Labs  Lab 08/24/21 1340  WBC 14.0*  NEUTROABS 12.0*  HGB 11.9*  HCT 36.5*  MCV 99.2  PLT [id number]    Basic Metabolic Panel: Recent Labs  Lab 08/24/21 1340  NA 132*  K 4.1  CL 99  CO2 26  GLUCOSE 128*  BUN 11  CREATININE 0.72  CALCIUM 8.7*   GFR: Estimated Creatinine Clearance: 88.5 mL/min (by C-G formula based on SCr of 0.72 mg/dL).  Liver Function Tests: Recent Labs  Lab 08/24/21 1340  AST 28  ALT 32  ALKPHOS 82  BILITOT 1.1  PROT 7.1  ALBUMIN 3.0*   Urine analysis:    Component Value Date/Time   COLORURINE AMBER (A) 08/24/2021 1819   APPEARANCEUR TURBID (A) 08/24/2021 1819   LABSPEC 1.027 08/24/2021 1819   PHURINE 5.0 08/24/2021 1819   GLUCOSEU NEGATIVE 08/24/2021 1819   HGBUR NEGATIVE 08/24/2021 1819   BILIRUBINUR NEGATIVE 08/24/2021 1819   KETONESUR 5 (A) 08/24/2021 1819   PROTEINUR 30 (A) 08/24/2021 1819   NITRITE NEGATIVE 08/24/2021 1819   LEUKOCYTESUR NEGATIVE 08/24/2021 1819  Radiological Exams on Admission: CT ABDOMEN PELVIS W CONTRAST  Result Date: 08/24/2021 CLINICAL DATA:  Abdominal pain, fever, recent pancreatitis with ERCP and stent placement, nausea and vomiting EXAM: CT ABDOMEN AND PELVIS WITH CONTRAST TECHNIQUE: Multidetector CT imaging of the abdomen and pelvis was performed using the standard protocol following bolus administration of intravenous contrast. CONTRAST:  147m OMNIPAQUE IOHEXOL 350 MG/ML SOLN COMPARISON:  08/06/2021, 08/05/2021 FINDINGS: Lower chest: There are small bilateral pleural effusions volume estimated less than 100 cc each. Minimal dependent lower lobe atelectasis. Hepatobiliary: The gallbladder remnant seen on prior exam is again identified, with multiple calcified gallstones measuring  up to 1 cm in size. Dilation of the common hepatic duct measuring 14 mm is unchanged. No significant intrahepatic biliary duct dilation. No evidence of downstream calculi within the common bile duct. Pancreas: Since the previous examination, there has been significant progression of the inflammatory changes surrounding the pancreas. Within the pancreatic head, there has been development of multiloculated fluid collection within the pancreatic parenchyma, with punctate foci of gas, worrisome for necrotic pancreatitis and subsequent abscess. This measures up to 8.0 x 5.2 cm reference image 34/2. Multilocular fluid collections are seen involving the ventral aspect of the pancreatic body and tail, measuring up to 2.4 cm in thickness, consistent with either further necrotic changes or multilocular pseudocyst. There is only minimal normal enhancing pancreatic tissue identified on this study. Pancreatic duct stent is seen extending from the pancreatic head into the duodenal lumen. Spleen: Normal in size without focal abnormality. Adrenals/Urinary Tract: Stable left adrenal adenoma. Right adrenals unremarkable. The kidneys enhance normally and symmetrically, without urinary tract calculi or obstructive uropathy. The bladder is decompressed. Stomach/Bowel: No bowel obstruction or ileus. Normal appendix right lower quadrant. No bowel wall thickening or inflammatory change. Vascular/Lymphatic: The splenic vein is difficult to visualize, likely secondary to extrinsic compression by the inflammatory process within the pancreatic bed. Splenic vein occlusion cannot be excluded, and there are new collateral venous structures in the left upper quadrant consistent with varices. There is extrinsic compression upon the SMV the near the portal confluence, but the SMV remains patent. The portal vein is widely patent. Mild atherosclerosis of the aorta again noted. No pathologic adenopathy within the abdomen or pelvis. Reproductive:  Prostate is unremarkable. Other: There is a small amount of free fluid within the lower pelvis. No free intraperitoneal gas. No abdominal wall hernia. Musculoskeletal: No acute or destructive bony lesions. Reconstructed images demonstrate no additional findings. IMPRESSION: 1. Progressive necrotizing pancreatitis as above, with 8.0 x 5.2 cm suspected abscess replacing the pancreatic head. Multilocular fluid collections along the pancreatic body may reflect further necrotic changes versus organizing pseudocysts. There is only minimal normal enhancing pancreatic tissue identified. 2. Suspected splenic vein thrombosis, with extrinsic compression on the superior mesenteric vein by the pancreatic abscess. Portal vein remains patent. 3. Stable common hepatic duct dilation. Persistent gallstones within the gallbladder remnant, with no evidence of downstream choledocholithiasis. 4. Indwelling pancreatic duct stent extending into the duodenal lumen. 5. Trace pelvic free fluid. 6. Small bilateral pleural effusions. 7.  Aortic Atherosclerosis (ICD10-I70.0). Electronically Signed   By: MRanda NgoM.D.   On: 08/24/2021 18:50    EKG: Independently reviewed.   Assessment/Plan Principal Problem:   Pancreatic abscess In the setting of   Necrotizing pancreatitis Observation/PCU. Keep NPO. Continue IV fluids. Antiemetics as needed. Analgesics as needed. Continue Zosyn 3.375 g every 8 hours. Consult IR in a.m. for abscess drainage, C&S.  Active Problems:   Splenic vein  thrombosis Risks and benefits discussed with sister. Begin heparin per pharmacy. Obtain liver/portal vein Doppler.    HTN (hypertension) Hold nifedipine. Hold lisinopril. Monitor BP, HR, GFR and electrolytes.    Normocytic anemia Monitor H&H. Transfuse as needed.    Hyponatremia Secondary to GI losses. Continue IV hydration. Follow-up sodium level.    Mild protein malnutrition ([place]) In the setting of recent poor nutrition.     Deafness Supportive care.    DVT prophylaxis: On heparin infusion. Code Status:   Full code. Family Communication:  His sister was in the ED room, provided most of the information and helped communicate with the patient. Disposition Plan:   Patient is from:  Home.  Anticipated DC to:  Home.  Anticipated DC date:  08/26/2021 or 08/27/2021.  Anticipated DC barriers: Clinical status.  Consults called:   Admission status:  Observation/progressive unit.   Severity of Illness: High severity after presenting with recurring abdominal pain associated with multiple episodes of emesis in the setting of newly developed pancreatic abscess and splenic vein thrombosis.  The patient will need to remain for IV antibiotic therapy, symptoms management and evaluation by IR for pancreatic abscess drainage.  Reubin Milan MD [facility]  How to contact the [facility] Attending or Consulting provider [place] or covering provider during after hours [place], for this patient?   Check the care team in [facility] and look for a) attending/consulting [facility] provider listed and b) the [facility] team listed Log into [email or web address] and use [place] universal password to access. If you do not have the password, please contact the hospital operator. Locate the [facility] provider you are looking for under [facility] and page to a number that you can be directly reached. If you still have difficulty reaching the provider, please page the [facility] (Director on Call) for the Hospitalists listed on amion for assistance.  08/24/2021, 9:36 PM   This document was prepared using Dragon voice recognition software and may contain some unintended transcription errors.

## 2021-08-24 NOTE — Progress Notes (Signed)
ANTICOAGULATION CONSULT NOTE   Pharmacy Consult for Heparin Indication: Splenic vein thrombosis  Allergies  Allergen Reactions   Codeine Itching and Nausea And Vomiting    Patient Measurements: Height: '6\' 1"'$  (185.4 cm) Weight: 73.9 kg (162 lb 14.7 oz) IBW/kg (Calculated) : 79.9 Heparin Dosing Weight: 73.9 kg  Vital Signs: Temp: 98.8 F (37.1 C) (09/15 1432) Temp Source: Oral (09/15 1432) BP: 97/59 (09/15 2000) Pulse Rate: 72 (09/15 2000)  Labs: Recent Labs    08/24/21 1340  HGB 11.9*  HCT 36.5*  PLT 223  CREATININE 0.72   Estimated Creatinine Clearance: 88.5 mL/min (by C-G formula based on SCr of 0.72 mg/dL).  Medical History: Past Medical History:  Diagnosis Date   Deaf    Hypertension    Pancreatitis    Medications:  Scheduled:   heparin  2,300 Units Intravenous Once   ondansetron (ZOFRAN) IV  4 mg Intravenous Once   pantoprazole (PROTONIX) IV  40 mg Intravenous Q24H   Infusions:   heparin     lactated ringers     lactated ringers     [START ON 08/25/2021] piperacillin-tazobactam (ZOSYN)  IV      Assessment: Recent pancreatitis, ERCP > stent & stone removal to ED with N/V. CT: necrotizing pancreatitis & susp splenic vein thrombosis. No hx anti-coagulation PTA Admit CBC wnl  Goal of Therapy:  Heparin level 0.3-0.7 units/ml Monitor platelets by anticoagulation protocol: Yes   Plan:  Heparin 2300 unit bolus, infusion at 1300 units/hr Check 1st Heparin level in 6 hr Daily CBC, begin daily Heparin level at steady state  Minda Ditto PharmD 08/24/2021,9:00 PM

## 2021-08-24 NOTE — ED Provider Notes (Signed)
Care assumed from previous provider at shift change.  See note for full HPI.  Summation 70 year old recent ERCP for choledocholithiasis, subsequent pancreatitis presents for evaluation of fever yesterday evening and upper abdominal pain.  Labs thus far unremarkable, plan on follow-up on CT scan  Sign language interpreter used Physical Exam  BP (!) 105/59   Pulse 71   Temp 98.8 F (37.1 C) (Oral)   Resp 20   Ht '6\' 1"'$  (1.854 m)   Wt 73.9 kg   SpO2 100%   BMI 21.49 kg/m   Physical Exam Vitals and nursing note reviewed.  Constitutional:      General: He is not in acute distress.    Appearance: He is well-developed. He is not ill-appearing, toxic-appearing or diaphoretic.  HENT:     Head: Normocephalic and atraumatic.  Eyes:     Pupils: Pupils are equal, round, and reactive to light.  Cardiovascular:     Rate and Rhythm: Normal rate and regular rhythm.  Pulmonary:     Effort: Pulmonary effort is normal. No respiratory distress.  Abdominal:     General: Bowel sounds are normal. There is no distension.     Palpations: Abdomen is soft.     Tenderness: There is abdominal tenderness in the epigastric area.  Musculoskeletal:        General: Normal range of motion.     Cervical back: Normal range of motion and neck supple.  Skin:    General: Skin is warm and dry.  Neurological:     General: No focal deficit present.     Mental Status: He is alert and oriented to person, place, and time.   ED Course/Procedures   Clinical Course as of 08/24/21 2320  Thu Aug 24, 2021  1533 Just Dc after ERCP pancreatic stent, FU on imaging, N/ Abd pain. Hematuria today. If CT good, FU with GI [BH]    Clinical Course User Index [BH] Fallou Hulbert A, PA-C   Labs Reviewed  COMPREHENSIVE METABOLIC PANEL - Abnormal; Notable for the following components:      Result Value   Sodium 132 (*)    Glucose, Bld 128 (*)    Calcium 8.7 (*)    Albumin 3.0 (*)    All other components within normal  limits  CBC WITH DIFFERENTIAL/PLATELET - Abnormal; Notable for the following components:   WBC 14.0 (*)    RBC 3.68 (*)    Hemoglobin 11.9 (*)    HCT 36.5 (*)    Neutro Abs 12.0 (*)    Abs Immature Granulocytes 0.10 (*)    All other components within normal limits  RESP PANEL BY RT-PCR (FLU A&B, COVID) [id number]  LIPASE, BLOOD  URINALYSIS, ROUTINE W REFLEX MICROSCOPIC   DG Abd 1 View  Result Date: 08/08/2021 CLINICAL DATA:  Emesis, abdominal pain. EXAM: ABDOMEN - 1 VIEW COMPARISON:  MRI abdomen/MRCP from August 06, 2021. FINDINGS: Supine abdominal radiograph with signs of moderate colonic distension of the rectosigmoid colon up to 7 cm. Stool and gas mixed throughout the remainder of the colon. Scattered gas-filled loops of small bowel. Signs of stent placement into the pancreatic duct with similar appearance. Lung bases are clear. On limited assessment there is no acute skeletal process. Spinal degenerative changes. IMPRESSION: Gaseous distension of bowel loops particularly rectosigmoid colon since previous imaging. Findings may reflect developing global ileus. Distension of the rectosigmoid near 7 cm warrants close attention on follow-up. Correlate with abdominal symptoms. Signs of stent placement into the pancreatic duct.  These results will be called to the ordering clinician or representative by the Radiologist Assistant, and communication documented in the PACS or [employer]. Electronically Signed   By: Zetta Bills M.D.   On: 08/08/2021 14:48   NM Hepatobiliary Liver Func  Result Date: 08/09/2021 CLINICAL DATA:  Choledocholithiasis. Prior cholecystectomy with small gallbladder remnant. EXAM: NUCLEAR MEDICINE HEPATOBILIARY IMAGING TECHNIQUE: Sequential images of the abdomen were obtained out to 60 minutes following intravenous administration of radiopharmaceutical. RADIOPHARMACEUTICALS:  5.3 mCi Tc-41m Choletec IV COMPARISON:  3 CT 08/06/2021, MRI 08/06/2021, ERCP 08/07/2021 FINDINGS:  Prompt clearance radiotracer from the blood pool and homogeneous uptake in liver. Counts are evident in the common bile duct and small bowel by 20 minutes. No evidence of biliary obstruction. Small cystic duct remnant/gallbladder remnant is noted. No evidence of biliary leak. IMPRESSION: 1. Patent common bile duct.  No evidence of biliary obstruction. 2. Small cystic duct remnant/gallbladder remnant noted. Electronically Signed   By: SSuzy BouchardM.D.   On: 08/09/2021 11:37   CT ABDOMEN PELVIS W CONTRAST  Result Date: 08/24/2021 CLINICAL DATA:  Abdominal pain, fever, recent pancreatitis with ERCP and stent placement, nausea and vomiting EXAM: CT ABDOMEN AND PELVIS WITH CONTRAST TECHNIQUE: Multidetector CT imaging of the abdomen and pelvis was performed using the standard protocol following bolus administration of intravenous contrast. CONTRAST:  1030mOMNIPAQUE IOHEXOL 350 MG/ML SOLN COMPARISON:  08/06/2021, 08/05/2021 FINDINGS: Lower chest: There are small bilateral pleural effusions volume estimated less than 100 cc each. Minimal dependent lower lobe atelectasis. Hepatobiliary: The gallbladder remnant seen on prior exam is again identified, with multiple calcified gallstones measuring up to 1 cm in size. Dilation of the common hepatic duct measuring 14 mm is unchanged. No significant intrahepatic biliary duct dilation. No evidence of downstream calculi within the common bile duct. Pancreas: Since the previous examination, there has been significant progression of the inflammatory changes surrounding the pancreas. Within the pancreatic head, there has been development of multiloculated fluid collection within the pancreatic parenchyma, with punctate foci of gas, worrisome for necrotic pancreatitis and subsequent abscess. This measures up to 8.0 x 5.2 cm reference image 34/2. Multilocular fluid collections are seen involving the ventral aspect of the pancreatic body and tail, measuring up to 2.4 cm in  thickness, consistent with either further necrotic changes or multilocular pseudocyst. There is only minimal normal enhancing pancreatic tissue identified on this study. Pancreatic duct stent is seen extending from the pancreatic head into the duodenal lumen. Spleen: Normal in size without focal abnormality. Adrenals/Urinary Tract: Stable left adrenal adenoma. Right adrenals unremarkable. The kidneys enhance normally and symmetrically, without urinary tract calculi or obstructive uropathy. The bladder is decompressed. Stomach/Bowel: No bowel obstruction or ileus. Normal appendix right lower quadrant. No bowel wall thickening or inflammatory change. Vascular/Lymphatic: The splenic vein is difficult to visualize, likely secondary to extrinsic compression by the inflammatory process within the pancreatic bed. Splenic vein occlusion cannot be excluded, and there are new collateral venous structures in the left upper quadrant consistent with varices. There is extrinsic compression upon the SMV the near the portal confluence, but the SMV remains patent. The portal vein is widely patent. Mild atherosclerosis of the aorta again noted. No pathologic adenopathy within the abdomen or pelvis. Reproductive: Prostate is unremarkable. Other: There is a small amount of free fluid within the lower pelvis. No free intraperitoneal gas. No abdominal wall hernia. Musculoskeletal: No acute or destructive bony lesions. Reconstructed images demonstrate no additional findings. IMPRESSION: 1. Progressive necrotizing pancreatitis  as above, with 8.0 x 5.2 cm suspected abscess replacing the pancreatic head. Multilocular fluid collections along the pancreatic body may reflect further necrotic changes versus organizing pseudocysts. There is only minimal normal enhancing pancreatic tissue identified. 2. Suspected splenic vein thrombosis, with extrinsic compression on the superior mesenteric vein by the pancreatic abscess. Portal vein remains  patent. 3. Stable common hepatic duct dilation. Persistent gallstones within the gallbladder remnant, with no evidence of downstream choledocholithiasis. 4. Indwelling pancreatic duct stent extending into the duodenal lumen. 5. Trace pelvic free fluid. 6. Small bilateral pleural effusions. 7.  Aortic Atherosclerosis (ICD10-I70.0). Electronically Signed   By: Randa Ngo M.D.   On: 08/24/2021 18:50   CT ABDOMEN PELVIS W CONTRAST  Result Date: 08/05/2021 CLINICAL DATA:  Persistent vomiting with history of prior cholecystectomy. EXAM: CT ABDOMEN AND PELVIS WITH CONTRAST TECHNIQUE: Multidetector CT imaging of the abdomen and pelvis was performed using the standard protocol following bolus administration of intravenous contrast. CONTRAST:  53m OMNIPAQUE IOHEXOL 350 MG/ML SOLN COMPARISON:  None. FINDINGS: Lower chest: No acute abnormality. Hepatobiliary: No focal liver abnormality is seen. Status post cholecystectomy. 9 mm and 14 mm calcifications are seen within the inferior aspect of the gallbladder fossa. The common bile duct measures approximately 9 mm. Pancreas: Marked severity peripancreatic inflammatory fat stranding and peripancreatic fluid is seen. This extends to involve the left upper quadrant and mid and lower portions of the right abdomen. Diffuse enlargement of the pancreatic head and uncinate process is also noted (approximately 3.6 cm x 3.6 cm (axial CT image 35, CT series 2). Spleen: Normal in size without focal abnormality. Adrenals/Urinary Tract: A 2.4 cm x 1.9 cm well-defined low-attenuation (approximately 15.91 Hounsfield units) left adrenal mass is noted. The right adrenal gland is normal in size and appearance. Kidneys are normal, without renal calculi, focal lesion, or hydronephrosis. Bladder is unremarkable. Stomach/Bowel: There is a small hiatal hernia. Appendix appears normal. No evidence of bowel dilatation. Vascular/Lymphatic: Aortic atherosclerosis. No enlarged abdominal or pelvic  lymph nodes. Reproductive: There is mild to moderate severity prostate gland enlargement. Other: No abdominal wall hernia or abnormality. No abdominopelvic ascites. Musculoskeletal: Multilevel degenerative changes are seen throughout the lumbar spine. IMPRESSION: 1. Marked severity acute pancreatitis with associated inflammatory changes. 2. Small retained gallstones within the gallbladder fossa, status post cholecystectomy. 3. Left adrenal mass which may represent an adrenal adenoma. 4. Small hiatal hernia. 5. Mild to moderate severity prostate gland enlargement. 6. Aortic atherosclerosis. Aortic Atherosclerosis (ICD10-I70.0). Electronically Signed   By: TVirgina NorfolkM.D.   On: 08/05/2021 18:18   MR 3D Recon At Scanner  Result Date: 08/06/2021 CLINICAL DATA:  Pancreatitis, jaundice, nausea, vomiting, history of cholecystectomy EXAM: MRI ABDOMEN WITHOUT AND WITH CONTRAST (INCLUDING MRCP) TECHNIQUE: Multiplanar multisequence MR imaging of the abdomen was performed both before and after the administration of intravenous contrast. Heavily T2-weighted images of the biliary and pancreatic ducts were obtained, and three-dimensional MRCP images were rendered by post processing. CONTRAST:  7.511mGADAVIST GADOBUTROL 1 MMOL/ML IV SOLN COMPARISON:  CT abdomen pelvis, 08/05/2021 FINDINGS: Lower chest: No acute findings. Hepatobiliary: No mass or other parenchymal abnormality identified. Status post cholecystectomy with a sizable gallbladder remnant in the gallbladder fossa containing multiple gallstones. There are two gallstones within the common bile duct in the pancreatic head, approximately 4 and 5 cm from the ampulla, measuring 5-6 mm each (series 6, image 15). There is severe intra and extrahepatic biliary ductal dilatation, the common bile duct measuring up to 1.5 cm in caliber.  Pancreas: Very extensive, diffuse edema of the pancreatic parenchyma with adjacent peripancreatic fat stranding and fluid (series 3,  image 20). There are areas of parenchymal hypoenhancement, particularly of the pancreatic head and neck (series 29, image 53, 37). No pancreatic ductal dilatation. No acute pancreatic fluid collection. Spleen:  Within normal limits in size and appearance. Adrenals/Urinary Tract: There is a definitively benign, fat containing left adrenal adenoma (series 9, image 27). No evidence of hydronephrosis. Stomach/Bowel: Visualized portions within the abdomen are unremarkable. Vascular/Lymphatic: No pathologically enlarged lymph nodes identified. No abdominal aortic aneurysm demonstrated. Other:  Small volume ascites. Musculoskeletal: No suspicious bone lesions identified. IMPRESSION: 1. Very extensive, diffuse edema of the pancreatic parenchyma with adjacent peripancreatic fat stranding and fluid. There are areas of parenchymal hypoenhancement, particularly of the pancreatic head and neck, concerning for necrosis in the setting of severe acute pancreatitis. No evidence of acute pancreatic fluid collection. 2. Severe intra and extrahepatic biliary ductal dilatation, the common bile duct measuring up to 1.5 cm in caliber. Choledocholithiasis with two gallstones within the common bile duct in the pancreatic head measuring 5-6 mm each, approximately 4 and 5 cm from the ampulla. 3. Status post cholecystectomy with a sizable gallbladder remnant in the gallbladder fossa containing multiple gallstones. 4. Small volume ascites. These results will be called to the ordering clinician or representative by the Radiologist Assistant, and communication documented in the PACS or [employer]. Electronically Signed   By: Eddie Candle M.D.   On: 08/06/2021 20:19   DG ERCP BILIARY & PANCREATIC DUCTS  Result Date: 08/08/2021 CLINICAL DATA:  Choledocholithiasis. EXAM: INTRAOPERATIVE CHOLANGIOGRAM TECHNIQUE: Cholangiographic images from the C-arm fluoroscopic device were submitted for interpretation post-operatively. Please see the  procedural report for the amount of contrast and the fluoroscopy time utilized. COMPARISON:  CT Abdomen Pelvis, 08/05/2021. Abdominal ultrasound, 08/06/2021. MR abdomen, 08/06/2021. FINDINGS: Fluoroscopy Time:  7 minutes, 32 seconds Radiation Exposure Index (if provided by the fluoroscopic device): None provided. Number of Acquired Spot Images: 11. Multiple, limited oblique planar views of the RIGHT upper quadrant demonstrating a flexible endoscope, pancreatic duct cannulation and plastic stent placement common common bile duct cannulation, cholangiogram, balloon sweep, and plastic biliary stent placement. No discrete filling defect demonstrated. IMPRESSION: Intra procedural fluoroscopic imaging for ERCP, biliary and pancreatic duct stent placements. Please see performing service dictation for complete description of intra procedural findings. Electronically Signed   By: Michaelle Birks M.D.   On: 08/08/2021 07:47   MR ABDOMEN MRCP W WO CONTAST  Result Date: 08/06/2021 CLINICAL DATA:  Pancreatitis, jaundice, nausea, vomiting, history of cholecystectomy EXAM: MRI ABDOMEN WITHOUT AND WITH CONTRAST (INCLUDING MRCP) TECHNIQUE: Multiplanar multisequence MR imaging of the abdomen was performed both before and after the administration of intravenous contrast. Heavily T2-weighted images of the biliary and pancreatic ducts were obtained, and three-dimensional MRCP images were rendered by post processing. CONTRAST:  7.37m GADAVIST GADOBUTROL 1 MMOL/ML IV SOLN COMPARISON:  CT abdomen pelvis, 08/05/2021 FINDINGS: Lower chest: No acute findings. Hepatobiliary: No mass or other parenchymal abnormality identified. Status post cholecystectomy with a sizable gallbladder remnant in the gallbladder fossa containing multiple gallstones. There are two gallstones within the common bile duct in the pancreatic head, approximately 4 and 5 cm from the ampulla, measuring 5-6 mm each (series 6, image 15). There is severe intra and  extrahepatic biliary ductal dilatation, the common bile duct measuring up to 1.5 cm in caliber. Pancreas: Very extensive, diffuse edema of the pancreatic parenchyma with adjacent peripancreatic fat stranding and fluid (  series 3, image 20). There are areas of parenchymal hypoenhancement, particularly of the pancreatic head and neck (series 29, image 53, 37). No pancreatic ductal dilatation. No acute pancreatic fluid collection. Spleen:  Within normal limits in size and appearance. Adrenals/Urinary Tract: There is a definitively benign, fat containing left adrenal adenoma (series 9, image 27). No evidence of hydronephrosis. Stomach/Bowel: Visualized portions within the abdomen are unremarkable. Vascular/Lymphatic: No pathologically enlarged lymph nodes identified. No abdominal aortic aneurysm demonstrated. Other:  Small volume ascites. Musculoskeletal: No suspicious bone lesions identified. IMPRESSION: 1. Very extensive, diffuse edema of the pancreatic parenchyma with adjacent peripancreatic fat stranding and fluid. There are areas of parenchymal hypoenhancement, particularly of the pancreatic head and neck, concerning for necrosis in the setting of severe acute pancreatitis. No evidence of acute pancreatic fluid collection. 2. Severe intra and extrahepatic biliary ductal dilatation, the common bile duct measuring up to 1.5 cm in caliber. Choledocholithiasis with two gallstones within the common bile duct in the pancreatic head measuring 5-6 mm each, approximately 4 and 5 cm from the ampulla. 3. Status post cholecystectomy with a sizable gallbladder remnant in the gallbladder fossa containing multiple gallstones. 4. Small volume ascites. These results will be called to the ordering clinician or representative by the Radiologist Assistant, and communication documented in the PACS or [employer]. Electronically Signed   By: Eddie Candle M.D.   On: 08/06/2021 20:19   US Abdomen Limited RUQ (LIVER/GB)  Result  Date: 08/06/2021 CLINICAL DATA:  Elevated serum total bilirubin levels. Prior cholecystectomy. EXAM: ULTRASOUND ABDOMEN LIMITED RIGHT UPPER QUADRANT COMPARISON:  January 17, 2021 FINDINGS: Gallbladder: The gallbladder is surgically absent. A 1.2 cm shadowing echogenic focus is noted within the gallbladder fossa. Common bile duct: Diameter: 15.1 mm Liver: No focal lesion identified. Within normal limits in parenchymal echogenicity. Portal vein is patent on color Doppler imaging with normal direction of blood flow towards the liver. Other: None. IMPRESSION: 1. Evidence of prior cholecystectomy with a retained gallstones suspected within the gallbladder fossa. Electronically Signed   By: Virgina Norfolk M.D.   On: 08/06/2021 00:56     .Critical Care Performed by: Nettie Elm, PA-C Authorized by: Nettie Elm, PA-C   Critical care provider statement:    Critical care time (minutes):  35   Critical care was necessary to treat or prevent imminent or life-threatening deterioration of the following conditions:  Metabolic crisis   Critical care was time spent personally by me on the following activities:  Discussions with consultants, evaluation of patient's response to treatment, examination of patient, ordering and performing treatments and interventions, ordering and review of laboratory studies, ordering and review of radiographic studies, pulse oximetry, re-evaluation of patient's condition, obtaining history from patient or surrogate and review of old charts  MDM  CT scan shows worsening necrotizing pancreatitis as well as newly developed pancreatic abscess subsequently causing splenic vein thrombosis likely due to abscess.   Started on IV antibiotics.  Will need to be admitted to hospital service.  No tachycardia, hypotension, tachypnea to suggest sepsis.  Suspect will likely need intervention for abscess. Defer anticoag to admitting team, suspect thrombosis due to stasis and  compression.  Discussed plan with patient and family in room via sign language interpreter.  All questions answered.  He does not want a thing for pain at this time.  Admit to hospitalist service  CONSULT with Dr. Olevia Bowens with [facility] who agrees to evaluate patient for admission.  The patient appears reasonably stabilized for admission considering  the current resources, flow, and capabilities available in the ED at this time, and I doubt any other [facility] requiring further screening and/or treatment in the ED prior to admission.   Patient seen eval by attending, Dr. Doren Custard who agrees above treatment, plan       Brodey Bonn A, PA-C 08/24/21 2321    Godfrey Pick, MD 08/25/21 [phone]

## 2021-08-24 NOTE — ED Triage Notes (Signed)
Patient is deaf. Patient was recently hospitalized with pancreatitis. Patient's sister reports that the patient c/o LUQ pain and states that a physician told him that they left a portion of his gallbladder had been left in him despite gall bladder surgery 30 years ago. Patient c/o fever of 100.7 last night and vomiting.

## 2021-08-24 NOTE — ED Notes (Signed)
Hospitalist at bedside 

## 2021-08-24 NOTE — ED Provider Notes (Signed)
Emergency Medicine Provider Triage Evaluation Note  Hunter Ward , a 70 y.o. male  was evaluated in triage.  Pt complains of Abdominal pain and vomiting.  He was recently discharged after acute pancreatitis.  He reports a little diarrhea.  His sister in room reports that he was constipated and they put him on medications.  He feels like both of his legs are swelling.  This has been about a few days ago, sister reports that it was about a week ago.    Not vaccinated against covid.    Interactions with patient performed with professional ASL interpreter.   Review of Systems  Positive: Fever, abdominal pain (upper abdomen), vomiting Negative: cough  Physical Exam  Ht '6\' 1"'$  (1.854 m)   Wt 73.9 kg   BMI 21.49 kg/m  Gen:   Awake, no distress   Resp:  Normal effort  MSK:   Moves extremities without difficulty  Other:  Appears chronically ill.   Medical Decision Making  Medically screening exam initiated at 1:30 PM.  Appropriate orders placed.  Lisa Roca was informed that the remainder of the evaluation will be completed by another provider, this initial triage assessment does not replace that evaluation, and the importance of remaining in the ED until their evaluation is complete.  Note: Portions of this report may have been transcribed using voice recognition software. Every effort was made to ensure accuracy; however, inadvertent computerized transcription errors may be present    Ollen Gross 08/24/21 1338    Lacretia Leigh, MD 08/26/21 1148

## 2021-08-25 ENCOUNTER — Inpatient Hospital Stay (HOSPITAL_COMMUNITY): Payer: Medicare Other

## 2021-08-25 DIAGNOSIS — E871 Hypo-osmolality and hyponatremia: Secondary | ICD-10-CM

## 2021-08-25 DIAGNOSIS — E43 Unspecified severe protein-calorie malnutrition: Secondary | ICD-10-CM | POA: Diagnosis not present

## 2021-08-25 DIAGNOSIS — K5900 Constipation, unspecified: Secondary | ICD-10-CM | POA: Diagnosis not present

## 2021-08-25 DIAGNOSIS — K2980 Duodenitis without bleeding: Secondary | ICD-10-CM | POA: Diagnosis not present

## 2021-08-25 DIAGNOSIS — K6389 Other specified diseases of intestine: Secondary | ICD-10-CM | POA: Diagnosis not present

## 2021-08-25 DIAGNOSIS — K8591 Acute pancreatitis with uninfected necrosis, unspecified: Secondary | ICD-10-CM | POA: Diagnosis not present

## 2021-08-25 DIAGNOSIS — E441 Mild protein-calorie malnutrition: Secondary | ICD-10-CM

## 2021-08-25 DIAGNOSIS — K8592 Acute pancreatitis with infected necrosis, unspecified: Secondary | ICD-10-CM | POA: Diagnosis not present

## 2021-08-25 DIAGNOSIS — D649 Anemia, unspecified: Secondary | ICD-10-CM | POA: Diagnosis not present

## 2021-08-25 DIAGNOSIS — K811 Chronic cholecystitis: Secondary | ICD-10-CM | POA: Diagnosis not present

## 2021-08-25 DIAGNOSIS — I82891 Chronic embolism and thrombosis of other specified veins: Secondary | ICD-10-CM | POA: Diagnosis present

## 2021-08-25 DIAGNOSIS — R1310 Dysphagia, unspecified: Secondary | ICD-10-CM | POA: Diagnosis not present

## 2021-08-25 DIAGNOSIS — H9190 Unspecified hearing loss, unspecified ear: Secondary | ICD-10-CM | POA: Diagnosis not present

## 2021-08-25 DIAGNOSIS — R9389 Abnormal findings on diagnostic imaging of other specified body structures: Secondary | ICD-10-CM | POA: Diagnosis not present

## 2021-08-25 DIAGNOSIS — Z6821 Body mass index (BMI) 21.0-21.9, adult: Secondary | ICD-10-CM | POA: Diagnosis not present

## 2021-08-25 DIAGNOSIS — R809 Proteinuria, unspecified: Secondary | ICD-10-CM | POA: Diagnosis not present

## 2021-08-25 DIAGNOSIS — K859 Acute pancreatitis without necrosis or infection, unspecified: Secondary | ICD-10-CM | POA: Diagnosis not present

## 2021-08-25 DIAGNOSIS — K863 Pseudocyst of pancreas: Secondary | ICD-10-CM | POA: Diagnosis not present

## 2021-08-25 DIAGNOSIS — R824 Acetonuria: Secondary | ICD-10-CM | POA: Diagnosis not present

## 2021-08-25 DIAGNOSIS — R1110 Vomiting, unspecified: Secondary | ICD-10-CM | POA: Diagnosis not present

## 2021-08-25 DIAGNOSIS — R6339 Other feeding difficulties: Secondary | ICD-10-CM | POA: Diagnosis not present

## 2021-08-25 DIAGNOSIS — K8020 Calculus of gallbladder without cholecystitis without obstruction: Secondary | ICD-10-CM | POA: Diagnosis not present

## 2021-08-25 DIAGNOSIS — H913 Deaf nonspeaking, not elsewhere classified: Secondary | ICD-10-CM | POA: Diagnosis present

## 2021-08-25 DIAGNOSIS — Z79899 Other long term (current) drug therapy: Secondary | ICD-10-CM | POA: Diagnosis not present

## 2021-08-25 DIAGNOSIS — K838 Other specified diseases of biliary tract: Secondary | ICD-10-CM | POA: Diagnosis not present

## 2021-08-25 DIAGNOSIS — B49 Unspecified mycosis: Secondary | ICD-10-CM | POA: Diagnosis not present

## 2021-08-25 DIAGNOSIS — I10 Essential (primary) hypertension: Secondary | ICD-10-CM | POA: Diagnosis not present

## 2021-08-25 DIAGNOSIS — K8689 Other specified diseases of pancreas: Secondary | ICD-10-CM | POA: Diagnosis not present

## 2021-08-25 DIAGNOSIS — E861 Hypovolemia: Secondary | ICD-10-CM | POA: Diagnosis present

## 2021-08-25 DIAGNOSIS — K3189 Other diseases of stomach and duodenum: Secondary | ICD-10-CM | POA: Diagnosis not present

## 2021-08-25 DIAGNOSIS — E222 Syndrome of inappropriate secretion of antidiuretic hormone: Secondary | ICD-10-CM | POA: Diagnosis not present

## 2021-08-25 DIAGNOSIS — E8809 Other disorders of plasma-protein metabolism, not elsewhere classified: Secondary | ICD-10-CM | POA: Diagnosis not present

## 2021-08-25 DIAGNOSIS — Z9049 Acquired absence of other specified parts of digestive tract: Secondary | ICD-10-CM | POA: Diagnosis not present

## 2021-08-25 DIAGNOSIS — K8050 Calculus of bile duct without cholangitis or cholecystitis without obstruction: Secondary | ICD-10-CM | POA: Diagnosis not present

## 2021-08-25 DIAGNOSIS — R188 Other ascites: Secondary | ICD-10-CM | POA: Diagnosis not present

## 2021-08-25 DIAGNOSIS — D735 Infarction of spleen: Secondary | ICD-10-CM | POA: Diagnosis not present

## 2021-08-25 DIAGNOSIS — Z4682 Encounter for fitting and adjustment of non-vascular catheter: Secondary | ICD-10-CM | POA: Diagnosis not present

## 2021-08-25 DIAGNOSIS — J90 Pleural effusion, not elsewhere classified: Secondary | ICD-10-CM | POA: Diagnosis not present

## 2021-08-25 DIAGNOSIS — Z4659 Encounter for fitting and adjustment of other gastrointestinal appliance and device: Secondary | ICD-10-CM | POA: Diagnosis not present

## 2021-08-25 DIAGNOSIS — Z4689 Encounter for fitting and adjustment of other specified devices: Secondary | ICD-10-CM | POA: Diagnosis not present

## 2021-08-25 DIAGNOSIS — Z885 Allergy status to narcotic agent status: Secondary | ICD-10-CM | POA: Diagnosis not present

## 2021-08-25 DIAGNOSIS — Z0389 Encounter for observation for other suspected diseases and conditions ruled out: Secondary | ICD-10-CM | POA: Diagnosis not present

## 2021-08-25 DIAGNOSIS — R109 Unspecified abdominal pain: Secondary | ICD-10-CM | POA: Diagnosis not present

## 2021-08-25 DIAGNOSIS — R1084 Generalized abdominal pain: Secondary | ICD-10-CM | POA: Diagnosis not present

## 2021-08-25 DIAGNOSIS — K315 Obstruction of duodenum: Secondary | ICD-10-CM | POA: Diagnosis not present

## 2021-08-25 DIAGNOSIS — Z8249 Family history of ischemic heart disease and other diseases of the circulatory system: Secondary | ICD-10-CM | POA: Diagnosis not present

## 2021-08-25 DIAGNOSIS — Z20822 Contact with and (suspected) exposure to covid-19: Secondary | ICD-10-CM | POA: Diagnosis not present

## 2021-08-25 LAB — CBC WITH DIFFERENTIAL/PLATELET
Abs Immature Granulocytes: 0.06 10*3/uL (ref 0.00–0.07)
Basophils Absolute: 0 10*3/uL (ref 0.0–0.1)
Basophils Relative: 0 %
Eosinophils Absolute: 0 10*3/uL (ref 0.0–0.5)
Eosinophils Relative: 0 %
HCT: 29.4 % — ABNORMAL LOW (ref 39.0–52.0)
Hemoglobin: 9.9 g/dL — ABNORMAL LOW (ref 13.0–17.0)
Immature Granulocytes: 1 %
Lymphocytes Relative: 5 %
Lymphs Abs: 0.6 10*3/uL — ABNORMAL LOW (ref 0.7–4.0)
MCH: 32.5 pg (ref 26.0–34.0)
MCHC: 33.7 g/dL (ref 30.0–36.0)
MCV: 96.4 fL (ref 80.0–100.0)
Monocytes Absolute: 0.8 10*3/uL (ref 0.1–1.0)
Monocytes Relative: 7 %
Neutro Abs: 11 10*3/uL — ABNORMAL HIGH (ref 1.7–7.7)
Neutrophils Relative %: 87 %
Platelets: 165 10*3/uL (ref 150–400)
RBC: 3.05 MIL/uL — ABNORMAL LOW (ref 4.22–5.81)
RDW: 12.7 % (ref 11.5–15.5)
WBC: 12.6 10*3/uL — ABNORMAL HIGH (ref 4.0–10.5)
nRBC: 0 % (ref 0.0–0.2)

## 2021-08-25 LAB — COMPREHENSIVE METABOLIC PANEL
ALT: 21 U/L (ref 0–44)
AST: 17 U/L (ref 15–41)
Albumin: 2.4 g/dL — ABNORMAL LOW (ref 3.5–5.0)
Alkaline Phosphatase: 65 U/L (ref 38–126)
Anion gap: 9 (ref 5–15)
BUN: 12 mg/dL (ref 8–23)
CO2: 25 mmol/L (ref 22–32)
Calcium: 8.3 mg/dL — ABNORMAL LOW (ref 8.9–10.3)
Chloride: 101 mmol/L (ref 98–111)
Creatinine, Ser: 0.77 mg/dL (ref 0.61–1.24)
GFR, Estimated: >60 mL/min (ref >60)
Glucose, Bld: 113 mg/dL — ABNORMAL HIGH (ref 70–99)
Potassium: 4.1 mmol/L (ref 3.5–5.1)
Sodium: 135 mmol/L (ref 135–145)
Total Bilirubin: 0.8 mg/dL (ref 0.3–1.2)
Total Protein: 5.7 g/dL — ABNORMAL LOW (ref 6.5–8.1)

## 2021-08-25 LAB — PREALBUMIN: Prealbumin: <5 mg/dL — ABNORMAL LOW (ref 18–38)

## 2021-08-25 LAB — HEPARIN LEVEL (UNFRACTIONATED): Heparin Unfractionated: <0.1 IU/mL — ABNORMAL LOW (ref 0.30–0.70)

## 2021-08-25 LAB — PROCALCITONIN: Procalcitonin: 0.4 ng/mL

## 2021-08-25 MED ORDER — PHENOL 1.4 % MT LIQD
1.0000 | OROMUCOSAL | Status: DC | PRN
  Administered 2021-09-02: 1 via OROMUCOSAL
  Filled 2021-08-25: qty 177

## 2021-08-25 MED ORDER — ENSURE ENLIVE PO LIQD
237.0000 mL | Freq: Two times a day (BID) | ORAL | Status: DC
  Administered 2021-08-31: 237 mL via ORAL

## 2021-08-25 MED ORDER — ENOXAPARIN SODIUM 40 MG/0.4ML IJ SOSY
40.0000 mg | PREFILLED_SYRINGE | Freq: Every day | INTRAMUSCULAR | Status: DC
  Administered 2021-08-26 – 2021-09-11 (×17): 40 mg via SUBCUTANEOUS
  Filled 2021-08-25 (×18): qty 0.4

## 2021-08-25 MED ORDER — HEPARIN BOLUS VIA INFUSION
1500.0000 [IU] | Freq: Once | INTRAVENOUS | Status: AC
  Administered 2021-08-25: 1500 [IU] via INTRAVENOUS
  Filled 2021-08-25: qty 1500

## 2021-08-25 NOTE — Assessment & Plan Note (Signed)
-   baseline Hgb was ~14 g/dL in August however has now downtrended to ~10 g/dL; no overt bleeding reported; likely due to acute and prolonged illness - continue trending Hgb

## 2021-08-25 NOTE — Hospital Course (Signed)
Hunter Ward is a 70 year old male with PMH pancreatitis, HTN, hx CCY (~30 yrs ago), deafness (uses ASL) who presented to the hospital with worsening abdomial pain and inability to keep any food down the past 3 days. His last meal kept down was on 08/22/21.  He was recently hospitalized from 08/05/21 to 08/12/21 due to gallstone pancreatitis.  Imaging at that time showed a sizeable gallbladder remnant in the GB fossa with evidence of severe acute pancreatitis due to small retained gallstones within the fossa; MRCP also showed choledocholithiasis with 2 stones within the CBD. He then underwent ERCP on 08/07/2021 removing 2 stones, sphincterotomy performed, and placement of a plastic stent in the ventral pancreatic duct. Plan at discharge was for patient to follow-up at [facility] with advanced GI and surgery for definitive stone removal from gallbladder remnant.  He was discharged on 08/12/2021 and due to minimal recovery at home with recurrence and ongoing abdominal pain with inability to tolerate nutrition, he has presented back for further evaluation.  On admission this hospitalization, he underwent repeat CT abdomen/pelvis.  This showed progressive necrotizing pancreatitis with concern for phlegmon or abscess development measuring 8 x 5.2 cm.  There was also suspected splenic vein thrombosis on CT for which he was started on heparin drip for.  Persistent gallstones were noted in the gallbladder remnant and stable common hepatic duct dilation. Liver Doppler was then performed which showed patent portal and splenic veins, therefore heparin drip was discontinued at this time.

## 2021-08-25 NOTE — Assessment & Plan Note (Addendum)
-   BP currently low/normal - Hold off on any antihypertensive agents at this time - continue LR

## 2021-08-25 NOTE — Assessment & Plan Note (Addendum)
-   Sodium 132 on admission - Presumed hypovolemic hyponatremia from GI losses due to nausea/vomiting and poor oral intake - continue LR until TPN started then d/c LR

## 2021-08-25 NOTE — Consult Note (Signed)
Referring Provider: Dr. Tennis Must Primary Care Physician:  Gaynelle Arabian, MD Primary Gastroenterologist:  Althia Forts  Reason for Consultation:  Pancreatitis  HPI: Hunter Ward is a 70 y.o. male with a remote history of a cholecystectomy, recent admission for gallstone pancreatitis with necrosis, choledocholithiasis on 08/06/2021, discharged on 08/12/2021. Patient had gallstones within gallbladder remnant last visit causing choledocholithiasis s/p ERCP 8/29 with 2 CBD stones removed and plastic pancreatic stent placement with Dr. Therisa Doyne. Plan was to refer to [facility] for consideration of endoscopic drainage of GB remnant (EUS-guided transduodenal drainage vs. ERCP with transpapillary cystic duct stent placement) to prevent recurrent choledocholithiasis.  Unfortunately patient woke up Wednesday morning with nausea, vomiting and severe AB pain.  He has had chills continuous for last 2 weeks.  He has had significant weight loss and weakness.   He had 9 days without a BM, started on linzess from PCP on Tuesday and had loose BM that evening without blood or melena.  No chest pain, SOB.   CT this visit showed progressive necrotizing pancreatitis with suspected abscess and suspected splenic vein thrombosis. US liver showed patent splenic veins.  IR was consulted for possible drainage of pancreatic abscess but they states there is no safe window for percutaneous drainage. Can reassess if more organized collection.   CT AB and pelvis with contrast on admission showed IMPRESSION: 1. Progressive necrotizing pancreatitis as above, with 8.0 x 5.2 cm suspected abscess replacing the pancreatic head. Multilocular fluid collections along the pancreatic body may reflect further necrotic changes versus organizing pseudocysts. There is only minimal normal enhancing pancreatic tissue identified. 2. 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 Portal vein remains patent. 3. Stable common hepatic duct dilation. Persistent gallstones within the gallbladder remnant, with no evidence of downstream choledocholithiasis. 4. Indwelling pancreatic duct stent extending into the duodenal lumen. 5. Trace pelvic free fluid. 6. Small bilateral pleural effusions.  US Liver Doppler 08/24/2021 IMPRESSION: 1. Patent portal and splenic veins. 2. Incidental bilateral pleural effusions and trace perihepatic ascites. No large volume intra-abdominal ascites.  Past Medical History:  Diagnosis Date   Deaf    Hypertension    Pancreatitis     Past Surgical History:  Procedure Laterality Date   CHOLECYSTECTOMY     ENDOSCOPIC RETROGRADE CHOLANGIOPANCREATOGRAPHY (ERCP) WITH PROPOFOL N/A 08/07/2021   Procedure: ENDOSCOPIC RETROGRADE CHOLANGIOPANCREATOGRAPHY (ERCP) WITH PROPOFOL;  Surgeon: Ronnette Juniper, MD;  Location: [facility];  Service: Gastroenterology;  Laterality: N/A;   JOINT REPLACEMENT Bilateral    PANCREATIC STENT PLACEMENT  08/07/2021   Procedure: PANCREATIC STENT PLACEMENT;  Surgeon: Ronnette Juniper, MD;  Location: [facility];  Service: Gastroenterology;;   REMOVAL OF STONES  08/07/2021   Procedure: REMOVAL OF STONES;  Surgeon: Ronnette Juniper, MD;  Location: [facility];  Service: Gastroenterology;;   Joan Mayans  08/07/2021   Procedure: Joan Mayans;  Surgeon: Ronnette Juniper, MD;  Location: [facility];  Service: Gastroenterology;;    Prior to Admission medications   Medication Sig Start Date End Date Taking? Authorizing Provider  docusate sodium (COLACE) 100 MG capsule Take 1 capsule (100 mg total) by mouth 2 (two) times daily. 08/12/21  Yes Barb Merino, MD  HYDROmorphone (DILAUDID) 2 MG tablet Take by mouth every 6 (six) hours as needed for severe pain.   Yes [provider]  lisinopril (ZESTRIL) 20 MG tablet Take 20 mg by mouth daily. 05/29/21  Yes [provider]   NIFEdipine (PROCARDIA-XL/NIFEDICAL-XL) 30 MG 24  hr tablet Take 30 mg by mouth daily. 05/29/21  Yes [provider]  pantoprazole (PROTONIX) 40 MG tablet Take 1 tablet (40 mg total) by mouth daily. 08/12/21 09/11/21 Yes Barb Merino, MD    Scheduled Meds:  feeding supplement  237 mL Oral BID BM   pantoprazole (PROTONIX) IV  40 mg Intravenous Q24H   Continuous Infusions:  lactated ringers Stopped (08/24/21 2230)   piperacillin-tazobactam (ZOSYN)  IV 3.375 g (08/25/21 0923)   PRN Meds:.acetaminophen **OR** acetaminophen, HYDROmorphone (DILAUDID) injection, ondansetron **OR** ondansetron (ZOFRAN) IV  Allergies as of 08/24/2021 - Review Complete 08/24/2021  Allergen Reaction Noted   Codeine Itching and Nausea And Vomiting 08/05/2021    Family History  Problem Relation Age of Onset   Hypertension Other     Social History   Socioeconomic History   Marital status: Single    Spouse name: Not on file   Number of children: Not on file   Years of education: Not on file   Highest education level: Not on file  Occupational History   Not on file  Tobacco Use   Smoking status: Never   Smokeless tobacco: Never  Vaping Use   Vaping Use: Never used  Substance and Sexual Activity   Alcohol use: Never   Drug use: Never   Sexual activity: Not on file  Other Topics Concern   Not on file  Social History Narrative   Not on file   Social Determinants of Health   Financial Resource Strain: Not on file  Food Insecurity: Not on file  Transportation Needs: Not on file  Physical Activity: Not on file  Stress: Not on file  Social Connections: Not on file  Intimate Partner Violence: Not on file    Review of Systems:  Review of Systems  Constitutional:  Positive for chills, malaise/fatigue and weight loss. Negative for diaphoresis and fever.  Respiratory:  Negative for cough and shortness of breath.   Cardiovascular:  Negative for chest pain and leg swelling.  Gastrointestinal:   Positive for abdominal pain, constipation, nausea and vomiting. Negative for blood in stool, diarrhea, heartburn and melena.  Musculoskeletal:  Negative for falls.  Skin:  Negative for itching.  Psychiatric/Behavioral:  Negative for depression.     Physical Exam: Vital signs: Vitals:   08/25/21 0618 08/25/21 0940  BP: (!) 87/60 91/64  Pulse: 69 70  Resp: 18 16  Temp: 98.1 F (36.7 C) 99.2 F (37.3 C)  SpO2: 98% 97%   Last BM Date:  (pt unsure) Physical Exam Constitutional:      Comments: Chronically ill appearing elderly male, resting in bed comfortably  Cardiovascular:     Rate and Rhythm: Normal rate and regular rhythm.     Heart sounds: No murmur heard. Pulmonary:     Effort: Pulmonary effort is normal. No respiratory distress.     Breath sounds: Normal breath sounds.  Abdominal:     General: Abdomen is flat. Bowel sounds are decreased. There is no distension.     Tenderness: There is abdominal tenderness in the right upper quadrant and epigastric area. There is no guarding or rebound.  Skin:    General: Skin is warm and dry.     Coloration: Skin is not jaundiced.  Neurological:     General: No focal deficit present.     Mental Status: He is oriented to person, place, and time.  Psychiatric:        Mood and Affect: Mood normal.  Behavior: Behavior normal.     GI:  Lab Results: Recent Labs    08/24/21 1340 08/25/21 0419  WBC 14.0* 12.6*  HGB 11.9* 9.9*  HCT 36.5* 29.4*  PLT 223 165   BMET Recent Labs    08/24/21 1340 08/25/21 0419  NA 132* 135  K 4.1 4.1  CL 99 101  CO2 26 25  GLUCOSE 128* 113*  BUN 11 12  CREATININE 0.72 0.77  CALCIUM 8.7* 8.3*   LFT Recent Labs    08/25/21 0419  PROT 5.7*  ALBUMIN 2.4*  AST 17  ALT 21  ALKPHOS 65  BILITOT 0.8   PT/INR No results for input(s): LABPROT, INR in the last 72 hours.  Studies/Results: CT ABDOMEN PELVIS W CONTRAST  Result Date: 08/24/2021 CLINICAL DATA:  Abdominal pain, fever,  recent pancreatitis with ERCP and stent placement, nausea and vomiting EXAM: CT ABDOMEN AND PELVIS WITH CONTRAST TECHNIQUE: Multidetector CT imaging of the abdomen and pelvis was performed using the standard protocol following bolus administration of intravenous contrast. CONTRAST:  122m OMNIPAQUE IOHEXOL 350 MG/ML SOLN COMPARISON:  08/06/2021, 08/05/2021 FINDINGS: Lower chest: There are small bilateral pleural effusions volume estimated less than 100 cc each. Minimal dependent lower lobe atelectasis. Hepatobiliary: The gallbladder remnant seen on prior exam is again identified, with multiple calcified gallstones measuring up to 1 cm in size. Dilation of the common hepatic duct measuring 14 mm is unchanged. No significant intrahepatic biliary duct dilation. No evidence of downstream calculi within the common bile duct. Pancreas: Since the previous examination, there has been significant progression of the inflammatory changes surrounding the pancreas. Within the pancreatic head, there has been development of multiloculated fluid collection within the pancreatic parenchyma, with punctate foci of gas, worrisome for necrotic pancreatitis and subsequent abscess. This measures up to 8.0 x 5.2 cm reference image 34/2. Multilocular fluid collections are seen involving the ventral aspect of the pancreatic body and tail, measuring up to 2.4 cm in thickness, consistent with either further necrotic changes or multilocular pseudocyst. There is only minimal normal enhancing pancreatic tissue identified on this study. Pancreatic duct stent is seen extending from the pancreatic head into the duodenal lumen. Spleen: Normal in size without focal abnormality. Adrenals/Urinary Tract: Stable left adrenal adenoma. Right adrenals unremarkable. The kidneys enhance normally and symmetrically, without urinary tract calculi or obstructive uropathy. The bladder is decompressed. Stomach/Bowel: No bowel obstruction or ileus. Normal appendix  right lower quadrant. No bowel wall thickening or inflammatory change. Vascular/Lymphatic: The splenic vein is difficult to visualize, likely secondary to extrinsic compression by the inflammatory process within the pancreatic bed. Splenic vein occlusion cannot be excluded, and there are new collateral venous structures in the left upper quadrant consistent with varices. There is extrinsic compression upon the SMV the near the portal confluence, but the SMV remains patent. The portal vein is widely patent. Mild atherosclerosis of the aorta again noted. No pathologic adenopathy within the abdomen or pelvis. Reproductive: Prostate is unremarkable. Other: There is a small amount of free fluid within the lower pelvis. No free intraperitoneal gas. No abdominal wall hernia. Musculoskeletal: No acute or destructive bony lesions. Reconstructed images demonstrate no additional findings. IMPRESSION: 1. Progressive necrotizing pancreatitis as above, with 8.0 x 5.2 cm suspected abscess replacing the pancreatic head. Multilocular fluid collections along the pancreatic body may reflect further necrotic changes versus organizing pseudocysts. There is only minimal normal enhancing pancreatic tissue identified. 2. 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 Portal vein remains patent. 3. Stable common hepatic duct dilation. Persistent gallstones within the gallbladder remnant, with no evidence of downstream choledocholithiasis. 4. Indwelling pancreatic duct stent extending into the duodenal lumen. 5. Trace pelvic free fluid. 6. Small bilateral pleural effusions. 7.  Aortic Atherosclerosis (ICD10-I70.0). Electronically Signed   By: Randa Ngo M.D.   On: 08/24/2021 18:50   US LIVER DOPPLER LIMITED (PV OR SINGLE ART/VEIN)  Result Date: 08/25/2021 CLINICAL DATA:  Splenic vein thrombosis. Acute necrotizing pancreatitis. EXAM: DUPLEX ULTRASOUND OF LIVER  TECHNIQUE: Color and duplex Doppler ultrasound was performed to evaluate the hepatic in-flow and out-flow vessels. COMPARISON:  CT Abdomen Pelvis, 08/24/2021. FINDINGS: Suboptimal evaluation secondary to poor acoustic penetration. Liver: Normal parenchymal echogenicity. Normal hepatic contour without nodularity. No focal lesion, mass or intrahepatic biliary ductal dilatation. Main Portal Vein size: 1.0 cm Portal Vein Velocities Main Prox:  22.7 cm/sec Main Mid: 20.4 cm/sec Main Dist:  13.9 cm/sec Right: 10.2 cm/sec Left: 10.8 cm/sec Hepatic Vein Velocities Right:  19.9 cm/sec Middle:  22.7 cm/sec Left:  35.2 cm/sec IVC: Present and patent with normal respiratory phasicity. Hepatic Artery Velocity:  196 cm/sec Splenic Vein Velocity:  15.8 cm/sec Spleen: 8.7 x 11.0 x 5.8 cm with a total volume of 288 cm^3 (411 cm^3 is upper limit normal). Small inferior pole accessory spleen. Portal Vein Occlusion/Thrombus: No Splenic Vein Occlusion/Thrombus: No Ascites: No large volume intra-abdominal ascites. Varices: None Incidental, bilateral pleural effusions. Trace perihepatic ascites. CBD dilation, measuring up to 1.3 cm and likely post cholecystectomy reservoir effect. IMPRESSION: 1. Patent portal and splenic veins. 2. Incidental bilateral pleural effusions and trace perihepatic ascites. No large volume intra-abdominal ascites. Michaelle Birks, MD [facility] Electronically Signed   By: Michaelle Birks M.D.   On: 08/25/2021 08:08    Impression and Plan Progressive necrotizing pancreatitis as above, with 8.0 x 5.2 cm suspected abscess  CT with no suspected CBD stones.  IR consulted- will contact if further progression of collection or more organized for drainage.  Continue zosyn at this time- WBC 14 to 12.6 trending down HGB 11.9 to 9.9 possible dilutional, continue to monitor.  BUN 12, Cr 0.77 stable Albumin 2.4- clear malnutrition and hypoalbuminemia.   Electrolytes are stable.  Patient has been NPO with poor intake for at least a month, will consult IR for nasojejunal tube placement to aid in prevention of malnutrition.  Aggressive IV Hydration currently on LR 100 cc  Continue supportive care with pain management Continue to trend LFTs and CBC.     LOS: 0 days   Vladimir Crofts  PA-C 08/25/2021, 11:51 AM  Contact #  [phone]

## 2021-08-25 NOTE — Progress Notes (Signed)
Progress Note    Hunter Ward   [id number]  DOB: [birth year]  DOA: 08/24/2021     0  PCP: Gaynelle Arabian, MD  Initial CC: abdominal pain, N/V  Hospital Course: Hunter Ward is a 70 year old male with PMH pancreatitis, HTN, hx CCY (~30 yrs ago), deafness (uses ASL) who presented to the hospital with worsening abdomial pain and inability to keep any food down the past 3 days. His last meal kept down was on 08/22/21.  He was recently hospitalized from 08/05/21 to 08/12/21 due to gallstone pancreatitis.  Imaging at that time showed a sizeable gallbladder remnant in the GB fossa with evidence of severe acute pancreatitis due to small retained gallstones within the fossa; MRCP also showed choledocholithiasis with 2 stones within the CBD. He then underwent ERCP on 08/07/2021 removing 2 stones, sphincterotomy performed, and placement of a plastic stent in the ventral pancreatic duct. Plan at discharge was for patient to follow-up at [facility] with advanced GI and surgery for definitive stone removal from gallbladder remnant.  He was discharged on 08/12/2021 and due to minimal recovery at home with recurrence and ongoing abdominal pain with inability to tolerate nutrition, he has presented back for further evaluation.  On admission this hospitalization, he underwent repeat CT abdomen/pelvis.  This showed progressive necrotizing pancreatitis with concern for phlegmon or abscess development measuring 8 x 5.2 cm.  There was also suspected splenic vein thrombosis on CT for which he was started on heparin drip for.  Persistent gallstones were noted in the gallbladder remnant and stable common hepatic duct dilation. Liver Doppler was then performed which showed patent portal and splenic veins, therefore heparin drip was discontinued at this time.  Interval History:  Seen this morning with family present bedside.  Sign language interpreter used for communication.  He has been unable to keep any food down the  past few days and last meal was reported to be on Tuesday. Most amount of pain is approximately in the middle of his abdomen but seems to be spread throughout.  ROS: Constitutional: negative for chills and fevers, Respiratory: negative for cough and sputum, Cardiovascular: negative for chest pain, and Gastrointestinal: positive for abdominal pain  Assessment & Plan: * Necrotizing pancreatitis - per CT A/P on 9/15: "Progressive necrotizing pancreatitis as above, with 8.0 x 5.2 cm suspected abscess replacing the pancreatic head. Multilocular fluid collections along the pancreatic body may reflect further necrotic changes versus organizing pseudocysts. There is only minimal normal enhancing pancreatic tissue identified." - unable to be drained/approachable via IR; recommended conservative management at this time; possibly can re-image in a few days if no significant improvement or any worsening - continue zosyn for now - GI also following, appreciate assistance  - continue NPO, IVF, pain/nausea control  - splenic vein thrombosis ruled out, d/c heparin drip  - IR consulted for [place] placement to then start TF; if unable would consider TPN but for now enteral feeding would be preferred as well   HTN (hypertension) - BP currently low/normal - Hold off on any antihypertensive agents at this time - continue LR  Deafness - ASL interpreter used for communication   Mild protein malnutrition ([place]) - Patient's BMI is Body mass index is 22.74 kg/m.. - Patient has the following signs/symptoms consistent with PCM: (fat loss, muscle loss, muscle wasting). - check pre-albumin   Hyponatremia - Sodium 132 on admission - Presumed hypovolemic hyponatremia from GI losses due to nausea/vomiting and poor oral intake - continue LR  Normocytic anemia - baseline Hgb was ~14 g/dL in August however has now downtrended to ~10 g/dL; no overt bleeding reported; likely due to acute and prolonged illness - continue  trending Hgb    Old records reviewed in assessment of this patient  Antimicrobials: Zosyn 08/24/2021 >> current  DVT prophylaxis: Lovenox    Code Status:   Code Status: Full Code Family Communication: family bedside  Disposition Plan: Status is: Inpatient  Remains inpatient appropriate because:Ongoing active pain requiring inpatient pain management, IV treatments appropriate due to intensity of illness or inability to take PO, and Inpatient level of care appropriate due to severity of illness  Dispo: The patient is from: Home              Anticipated d/c is to: Home              Patient currently is not medically stable to d/c.   Difficult to place patient No Risk of unplanned readmission score: Unplanned Admission- Pilot do not use: 13.27   Objective: Blood pressure 91/64, pulse 70, temperature 99.2 F (37.3 C), temperature source Oral, resp. rate 16, height '5\' 10"'$  (1.778 m), weight 71.9 kg, SpO2 97 %.  Examination: General appearance: alert, cooperative, and no distress Head: Normocephalic, without obvious abnormality, atraumatic Eyes:  EOMI Lungs: clear to auscultation bilaterally Heart: regular rate and rhythm and S1, S2 normal Abdomen:  soft, ND, TTP but worse in upper quadrants with slight rebound appreciated, no guarding; BS present but mildly hypoactive  Extremities:  no edema Skin: mobility and turgor normal Neurologic: Grossly normal  Consultants:  GI  Procedures:    Data Reviewed: I have personally reviewed following labs and imaging studies Results for orders placed or performed during the hospital encounter of 08/24/21 (from the past 24 hour(s))  Urinalysis, Routine w reflex microscopic Urine, Clean Catch     Status: Abnormal   Collection Time: 08/24/21  6:19 PM  Result Value Ref Range   Color, Urine AMBER (A) YELLOW   APPearance TURBID (A) CLEAR   Specific Gravity, Urine 1.027 1.005 - 1.030   pH 5.0 5.0 - 8.0   Glucose, UA NEGATIVE NEGATIVE mg/dL    Hgb urine dipstick NEGATIVE NEGATIVE   Bilirubin Urine NEGATIVE NEGATIVE   Ketones, ur 5 (A) NEGATIVE mg/dL   Protein, ur 30 (A) NEGATIVE mg/dL   Nitrite NEGATIVE NEGATIVE   Leukocytes,Ua NEGATIVE NEGATIVE   RBC / HPF 0-5 0 - 5 RBC/hpf   Bacteria, UA NONE SEEN NONE SEEN   Squamous Epithelial / LPF 0-5 0 - 5   Mucus PRESENT    Amorphous Crystal PRESENT   CBC WITH DIFFERENTIAL     Status: Abnormal   Collection Time: 08/25/21  4:19 AM  Result Value Ref Range   WBC 12.6 (H) 4.0 - 10.5 K/uL   RBC 3.05 (L) 4.22 - 5.81 MIL/uL   Hemoglobin 9.9 (L) 13.0 - 17.0 g/dL   HCT 29.4 (L) 39.0 - 52.0 %   MCV 96.4 80.0 - 100.0 fL   MCH 32.5 26.0 - 34.0 pg   MCHC 33.7 30.0 - 36.0 g/dL   RDW 12.7 11.5 - 15.5 %   Platelets 165 150 - 400 K/uL   nRBC 0.0 0.0 - 0.2 %   Neutrophils Relative % 87 %   Neutro Abs 11.0 (H) 1.7 - 7.7 K/uL   Lymphocytes Relative 5 %   Lymphs Abs 0.6 (L) 0.7 - 4.0 K/uL   Monocytes Relative 7 %   Monocytes  Absolute 0.8 0.1 - 1.0 K/uL   Eosinophils Relative 0 %   Eosinophils Absolute 0.0 0.0 - 0.5 K/uL   Basophils Relative 0 %   Basophils Absolute 0.0 0.0 - 0.1 K/uL   Immature Granulocytes 1 %   Abs Immature Granulocytes 0.06 0.00 - 0.07 K/uL  Comprehensive metabolic panel     Status: Abnormal   Collection Time: 08/25/21  4:19 AM  Result Value Ref Range   Sodium 135 135 - 145 mmol/L   Potassium 4.1 3.5 - 5.1 mmol/L   Chloride 101 98 - 111 mmol/L   CO2 25 22 - 32 mmol/L   Glucose, Bld 113 (H) 70 - 99 mg/dL   BUN 12 8 - 23 mg/dL   Creatinine, Ser 0.77 0.61 - 1.24 mg/dL   Calcium 8.3 (L) 8.9 - 10.3 mg/dL   Total Protein 5.7 (L) 6.5 - 8.1 g/dL   Albumin 2.4 (L) 3.5 - 5.0 g/dL   AST 17 15 - 41 U/L   ALT 21 0 - 44 U/L   Alkaline Phosphatase 65 38 - 126 U/L   Total Bilirubin 0.8 0.3 - 1.2 mg/dL   GFR, Estimated >60 >60 mL/min   Anion gap 9 5 - 15  Heparin level (unfractionated)     Status: Abnormal   Collection Time: 08/25/21  4:19 AM  Result Value Ref Range    Heparin Unfractionated <0.10 (L) 0.30 - 0.70 IU/mL  Procalcitonin - Baseline     Status: None   Collection Time: 08/25/21  4:19 AM  Result Value Ref Range   Procalcitonin 0.40 ng/mL    Recent Results (from the past 240 hour(s))  Resp Panel by RT-PCR (Flu A&B, Covid) Nasopharyngeal Swab     Status: None   Collection Time: 08/24/21  1:37 PM   Specimen: Nasopharyngeal Swab; Nasopharyngeal(NP) swabs in vial transport medium  Result Value Ref Range Status   SARS Coronavirus 2 by RT PCR NEGATIVE NEGATIVE Final    Comment: (NOTE) SARS-CoV-2 target nucleic acids are NOT DETECTED.  The SARS-CoV-2 RNA is generally detectable in upper respiratory specimens during the acute phase of infection. The lowest concentration of SARS-CoV-2 viral copies this assay can detect is 138 copies/mL. A negative result does not preclude SARS-Cov-2 infection and should not be used as the sole basis for treatment or other patient management decisions. A negative result may occur with  improper specimen collection/handling, submission of specimen other than nasopharyngeal swab, presence of viral mutation(s) within the areas targeted by this assay, and inadequate number of viral copies(<138 copies/mL). A negative result must be combined with clinical observations, patient history, and epidemiological information. The expected result is Negative.  Fact Sheet for Patients:  EntrepreneurPulse.com.au  Fact Sheet for Healthcare Providers:  IncredibleEmployment.be  This test is no t yet approved or cleared by the [place] FDA and  has been authorized for detection and/or diagnosis of SARS-CoV-2 by FDA under an Emergency Use Authorization (EUA). This EUA will remain  in effect (meaning this test can be used) for the duration of the COVID-19 declaration under Section 564(b)(1) of the Act, 21 U.S.C.section 360bbb-3(b)(1), unless the authorization is terminated  or revoked sooner.        Influenza A by PCR NEGATIVE NEGATIVE Final   Influenza B by PCR NEGATIVE NEGATIVE Final    Comment: (NOTE) The Xpert Xpress SARS-CoV-2/FLU/RSV plus assay is intended as an aid in the diagnosis of influenza from Nasopharyngeal swab specimens and should not be used as a sole basis  for treatment. Nasal washings and aspirates are unacceptable for Xpert Xpress SARS-CoV-2/FLU/RSV testing.  Fact Sheet for Patients: EntrepreneurPulse.com.au  Fact Sheet for Healthcare Providers: IncredibleEmployment.be  This test is not yet approved or cleared by the [place] FDA and has been authorized for detection and/or diagnosis of SARS-CoV-2 by FDA under an Emergency Use Authorization (EUA). This EUA will remain in effect (meaning this test can be used) for the duration of the COVID-19 declaration under Section 564(b)(1) of the Act, 21 U.S.C. section 360bbb-3(b)(1), unless the authorization is terminated or revoked.  Performed at [facility], [place] [address] [place], [place] [address]      Radiology Studies: CT ABDOMEN PELVIS W CONTRAST  Result Date: 08/24/2021 CLINICAL DATA:  Abdominal pain, fever, recent pancreatitis with ERCP and stent placement, nausea and vomiting EXAM: CT ABDOMEN AND PELVIS WITH CONTRAST TECHNIQUE: Multidetector CT imaging of the abdomen and pelvis was performed using the standard protocol following bolus administration of intravenous contrast. CONTRAST:  126m OMNIPAQUE IOHEXOL 350 MG/ML SOLN COMPARISON:  08/06/2021, 08/05/2021 FINDINGS: Lower chest: There are small bilateral pleural effusions volume estimated less than 100 cc each. Minimal dependent lower lobe atelectasis. Hepatobiliary: The gallbladder remnant seen on prior exam is again identified, with multiple calcified gallstones measuring up to 1 cm in size. Dilation of the common hepatic duct measuring 14 mm is unchanged. No significant intrahepatic  biliary duct dilation. No evidence of downstream calculi within the common bile duct. Pancreas: Since the previous examination, there has been significant progression of the inflammatory changes surrounding the pancreas. Within the pancreatic head, there has been development of multiloculated fluid collection within the pancreatic parenchyma, with punctate foci of gas, worrisome for necrotic pancreatitis and subsequent abscess. This measures up to 8.0 x 5.2 cm reference image 34/2. Multilocular fluid collections are seen involving the ventral aspect of the pancreatic body and tail, measuring up to 2.4 cm in thickness, consistent with either further necrotic changes or multilocular pseudocyst. There is only minimal normal enhancing pancreatic tissue identified on this study. Pancreatic duct stent is seen extending from the pancreatic head into the duodenal lumen. Spleen: Normal in size without focal abnormality. Adrenals/Urinary Tract: Stable left adrenal adenoma. Right adrenals unremarkable. The kidneys enhance normally and symmetrically, without urinary tract calculi or obstructive uropathy. The bladder is decompressed. Stomach/Bowel: No bowel obstruction or ileus. Normal appendix right lower quadrant. No bowel wall thickening or inflammatory change. Vascular/Lymphatic: The splenic vein is difficult to visualize, likely secondary to extrinsic compression by the inflammatory process within the pancreatic bed. Splenic vein occlusion cannot be excluded, and there are new collateral venous structures in the left upper quadrant consistent with varices. There is extrinsic compression upon the SMV the near the portal confluence, but the SMV remains patent. The portal vein is widely patent. Mild atherosclerosis of the aorta again noted. No pathologic adenopathy within the abdomen or pelvis. Reproductive: Prostate is unremarkable. Other: There is a small amount of free fluid within the lower pelvis. No free intraperitoneal  gas. No abdominal wall hernia. Musculoskeletal: No acute or destructive bony lesions. Reconstructed images demonstrate no additional findings. IMPRESSION: 1. Progressive necrotizing pancreatitis as above, with 8.0 x 5.2 cm suspected abscess replacing the pancreatic head. Multilocular fluid collections along the pancreatic body may reflect further necrotic changes versus organizing pseudocysts. There is only minimal normal enhancing pancreatic tissue identified. 2. Suspected splenic vein thrombosis, with extrinsic compression on the superior mesenteric vein by the pancreatic abscess. Portal vein remains patent. 3. Stable common  hepatic duct dilation. Persistent gallstones within the gallbladder remnant, with no evidence of downstream choledocholithiasis. 4. Indwelling pancreatic duct stent extending into the duodenal lumen. 5. Trace pelvic free fluid. 6. Small bilateral pleural effusions. 7.  Aortic Atherosclerosis (ICD10-I70.0). Electronically Signed   By: Randa Ngo M.D.   On: 08/24/2021 18:50   US LIVER DOPPLER LIMITED (PV OR SINGLE ART/VEIN)  Result Date: 08/25/2021 CLINICAL DATA:  Splenic vein thrombosis. Acute necrotizing pancreatitis. EXAM: DUPLEX ULTRASOUND OF LIVER TECHNIQUE: Color and duplex Doppler ultrasound was performed to evaluate the hepatic in-flow and out-flow vessels. COMPARISON:  CT Abdomen Pelvis, 08/24/2021. FINDINGS: Suboptimal evaluation secondary to poor acoustic penetration. Liver: Normal parenchymal echogenicity. Normal hepatic contour without nodularity. No focal lesion, mass or intrahepatic biliary ductal dilatation. Main Portal Vein size: 1.0 cm Portal Vein Velocities Main Prox:  22.7 cm/sec Main Mid: 20.4 cm/sec Main Dist:  13.9 cm/sec Right: 10.2 cm/sec Left: 10.8 cm/sec Hepatic Vein Velocities Right:  19.9 cm/sec Middle:  22.7 cm/sec Left:  35.2 cm/sec IVC: Present and patent with normal respiratory phasicity. Hepatic Artery Velocity:  196 cm/sec Splenic Vein Velocity:  15.8  cm/sec Spleen: 8.7 x 11.0 x 5.8 cm with a total volume of 288 cm^3 (411 cm^3 is upper limit normal). Small inferior pole accessory spleen. Portal Vein Occlusion/Thrombus: No Splenic Vein Occlusion/Thrombus: No Ascites: No large volume intra-abdominal ascites. Varices: None Incidental, bilateral pleural effusions. Trace perihepatic ascites. CBD dilation, measuring up to 1.3 cm and likely post cholecystectomy reservoir effect. IMPRESSION: 1. Patent portal and splenic veins. 2. Incidental bilateral pleural effusions and trace perihepatic ascites. No large volume intra-abdominal ascites. Michaelle Birks, MD [facility] Electronically Signed   By: Michaelle Birks M.D.   On: 08/25/2021 08:08   US LIVER DOPPLER LIMITED (PV OR SINGLE ART/VEIN)  Final Result    CT ABDOMEN PELVIS W CONTRAST  Final Result    DG Naso G Tube Plc W/Fl W/Rad    (Results Pending)    Scheduled Meds:  feeding supplement  237 mL Oral BID BM   pantoprazole (PROTONIX) IV  40 mg Intravenous Q24H   PRN Meds: acetaminophen **OR** acetaminophen, HYDROmorphone (DILAUDID) injection, ondansetron **OR** ondansetron (ZOFRAN) IV Continuous Infusions:  lactated ringers 100 mL/hr at 08/25/21 1258   piperacillin-tazobactam (ZOSYN)  IV 3.375 g (08/25/21 0923)     LOS: 0 days  Time spent: Greater than 50% of the 35 minute visit was spent in counseling/coordination of care for the patient as laid out in the A&P.   Dwyane Dee, MD [facility] 08/25/2021, 2:03 PM

## 2021-08-25 NOTE — Progress Notes (Signed)
Initial Nutrition Assessment  INTERVENTION:   TF recommendations: -Provide Osmolite 1.5 @ 20 ml/hr, advance by 10 ml every 4 hours to goal rate of 55 ml/hr. -45 ml Prosource TF daily -Provides 1980 kcals, 93g protein and 1005 ml H2O -Free water of 200 ml every 6 hours (800 ml)  NUTRITION DIAGNOSIS:   Increased nutrient needs related to acute illness as evidenced by estimated needs.  GOAL:   Patient will meet greater than or equal to 90% of their needs  MONITOR:   Labs, Diet advancement, Weight trends, I & O's  REASON FOR ASSESSMENT:   Malnutrition Screening Tool    ASSESSMENT:   70 year old male with PMH pancreatitis, HTN, hx CCY (~30 yrs ago), deafness (uses ASL) who presented to the hospital with worsening abdomial pain and inability to keep any food down the past 3 days. His last meal kept down was on 08/22/21.  Patient has been having poor appetite r/t N/V/D since 9/14. Recently admitted for acute pancreatitis, discharged on 9/3.  Pt currently NPO.  Per IR, pt unable to have drain placed. GI following and recommending [place] tube placement. Will leave TF recommendations.  Per weight records, pt has lost 4 lbs since 8/29, insignificant for time frame.  Medications: Lactated ringers, IV Zofran  Labs reviewed.  NUTRITION - FOCUSED PHYSICAL EXAM:  Deferred.  Diet Order:   Diet Order             Diet NPO time specified  Diet effective now                   EDUCATION NEEDS:   No education needs have been identified at this time  Skin:  Skin Assessment: Reviewed RN Assessment  Last BM:  PTA  Height:   Ht Readings from Last 1 Encounters:  08/25/21 '5\' 10"'$  (1.778 m)    Weight:   Wt Readings from Last 1 Encounters:  08/25/21 71.9 kg    BMI:  Body mass index is 22.74 kg/m.  Estimated Nutritional Needs:   Kcal:  1800-2000  Protein:  85-95g  Fluid:  2L/day  Clayton Bibles, MS, RD, LDN Inpatient Clinical Dietitian Contact information  available via Amion

## 2021-08-25 NOTE — Plan of Care (Signed)
Initiated General Care Plan  ° °

## 2021-08-25 NOTE — Assessment & Plan Note (Addendum)
-   Patient's BMI is Body mass index is 22.74 kg/m.. - Patient has the following signs/symptoms consistent with PCM: (fat loss, muscle loss, muscle wasting). - check pre-albumin (<5 mg/dL) - starting TPN after PICC placed

## 2021-08-25 NOTE — Assessment & Plan Note (Signed)
-   ASL interpreter used for communication

## 2021-08-25 NOTE — Progress Notes (Signed)
Patient ID: JARAAD FUGITT, male   DOB: December 05, 1951, 70 y.o.   MRN: [id number] Request received for drainage of ? pancreatic abscess in patient.  Latest imaging studies have been reviewed by Drs. McCullough and El - Abd. In the area of concern there is very inflammatory /phlegmonous change without a great drainable target and no definite safe window for percutaneous drainage.  They recommend conservative management at this time and if clinically worsens can repeat imaging to reassess for a more organized collection which may be amenable to drainage. Please call Dr. Ky Barban- Abd at [phone] or pager [phone] with any further questions.

## 2021-08-25 NOTE — Assessment & Plan Note (Addendum)
-   per CT A/P on 9/15: "Progressive necrotizing pancreatitis as above, with 8.0 x 5.2 cm suspected abscess replacing the pancreatic head. Multilocular fluid collections along the pancreatic body may reflect further necrotic changes versus organizing pseudocysts. There is only minimal normal enhancing pancreatic tissue identified." - unable to be drained/approachable via IR; recommended conservative management at this time; possibly can re-image in a few days if no significant improvement or any worsening - continue zosyn for now - GI also following, appreciate assistance  - continue NPO, IVF, pain/nausea control  - splenic vein thrombosis ruled out, d/c'd heparin drip  - NGT in place but currently not to be used; not fully passed pylorus plus significant duodenal stricture/compression from pancreatic abscess and patient would not tolerate TF at this time - patient now ~6 days since adequate nutrition; discussed with GI as well; will place PICC and start TPN for now (d/c LR once TPN started) - continue bowel rest and upper GI series later this coming week - at risk for ileus; bowel sounds becoming more hypoactive

## 2021-08-25 NOTE — Progress Notes (Signed)
ANTICOAGULATION CONSULT NOTE   Pharmacy Consult for Heparin Indication: Splenic vein thrombosis  Allergies  Allergen Reactions   Codeine Itching and Nausea And Vomiting    Patient Measurements: Height: '5\' 10"'$  (177.8 cm) Weight: 71.9 kg (158 lb 8.2 oz) IBW/kg (Calculated) : 73 Heparin Dosing Weight: 73.9 kg  Vital Signs: Temp: 99.5 F (37.5 C) (09/16 0200) Temp Source: Oral (09/16 0200) BP: 109/70 (09/16 0200) Pulse Rate: 70 (09/16 0200)  Labs: Recent Labs    08/24/21 1340 08/25/21 0419  HGB 11.9* 9.9*  HCT 36.5* 29.4*  PLT 223 165  HEPARINUNFRC  --  <0.10*  CREATININE 0.72 0.77    Estimated Creatinine Clearance: 86.1 mL/min (by C-G formula based on SCr of 0.77 mg/dL).  Medical History: Past Medical History:  Diagnosis Date   Deaf    Hypertension    Pancreatitis    Medications:  Scheduled:   feeding supplement  237 mL Oral BID BM   pantoprazole (PROTONIX) IV  40 mg Intravenous Q24H   Infusions:   heparin 1,300 Units/hr (08/24/21 2229)   lactated ringers Stopped (08/24/21 2230)   piperacillin-tazobactam (ZOSYN)  IV 3.375 g (08/25/21 0209)    Assessment: Recent pancreatitis, ERCP > stent & stone removal to ED with N/V. CT: necrotizing pancreatitis & susp splenic vein thrombosis. No hx anti-coagulation PTA.  Admit CBC wnl  08/25/2021: Initial heparin level <0.1 on IV heparin 1300 units/hr CBC: Hg slightly low at 9.9, pltc 165 No bleeding or infusion related concerns per RN  Goal of Therapy:  Heparin level 0.3-0.7 units/ml Monitor platelets by anticoagulation protocol: Yes   Plan:  Heparin 1500 unit bolus, then increase infusion to 1500 units/hr Check Heparin level in 6 hr Daily CBC, Heparin level while on heparin  Netta Cedars PharmD 08/25/2021,5:58 AM

## 2021-08-26 ENCOUNTER — Inpatient Hospital Stay (HOSPITAL_COMMUNITY): Payer: Medicare Other

## 2021-08-26 DIAGNOSIS — K8591 Acute pancreatitis with uninfected necrosis, unspecified: Secondary | ICD-10-CM | POA: Diagnosis not present

## 2021-08-26 LAB — CBC WITH DIFFERENTIAL/PLATELET
Abs Immature Granulocytes: 0.09 10*3/uL — ABNORMAL HIGH (ref 0.00–0.07)
Basophils Absolute: 0 10*3/uL (ref 0.0–0.1)
Basophils Relative: 0 %
Eosinophils Absolute: 0 10*3/uL (ref 0.0–0.5)
Eosinophils Relative: 0 %
HCT: 29 % — ABNORMAL LOW (ref 39.0–52.0)
Hemoglobin: 9.6 g/dL — ABNORMAL LOW (ref 13.0–17.0)
Immature Granulocytes: 1 %
Lymphocytes Relative: 8 %
Lymphs Abs: 1 10*3/uL (ref 0.7–4.0)
MCH: 32.1 pg (ref 26.0–34.0)
MCHC: 33.1 g/dL (ref 30.0–36.0)
MCV: 97 fL (ref 80.0–100.0)
Monocytes Absolute: 0.9 10*3/uL (ref 0.1–1.0)
Monocytes Relative: 7 %
Neutro Abs: 10.5 10*3/uL — ABNORMAL HIGH (ref 1.7–7.7)
Neutrophils Relative %: 84 %
Platelets: 175 10*3/uL (ref 150–400)
RBC: 2.99 MIL/uL — ABNORMAL LOW (ref 4.22–5.81)
RDW: 13 % (ref 11.5–15.5)
WBC: 12.4 10*3/uL — ABNORMAL HIGH (ref 4.0–10.5)
nRBC: 0 % (ref 0.0–0.2)

## 2021-08-26 LAB — BASIC METABOLIC PANEL
Anion gap: 9 (ref 5–15)
BUN: 13 mg/dL (ref 8–23)
CO2: 25 mmol/L (ref 22–32)
Calcium: 8.3 mg/dL — ABNORMAL LOW (ref 8.9–10.3)
Chloride: 103 mmol/L (ref 98–111)
Creatinine, Ser: 0.72 mg/dL (ref 0.61–1.24)
GFR, Estimated: >60 mL/min (ref >60)
Glucose, Bld: 97 mg/dL (ref 70–99)
Potassium: 4 mmol/L (ref 3.5–5.1)
Sodium: 137 mmol/L (ref 135–145)

## 2021-08-26 LAB — PROCALCITONIN: Procalcitonin: 0.43 ng/mL

## 2021-08-26 LAB — MAGNESIUM: Magnesium: 1.7 mg/dL (ref 1.7–2.4)

## 2021-08-26 MED ORDER — PROCHLORPERAZINE EDISYLATE 10 MG/2ML IJ SOLN: 10.0000 mg | INTRAMUSCULAR | Status: DC | PRN

## 2021-08-26 MED ORDER — PROCHLORPERAZINE EDISYLATE 10 MG/2ML IJ SOLN
10.0000 mg | Freq: Four times a day (QID) | INTRAMUSCULAR | Status: DC | PRN
  Administered 2021-08-26 – 2021-09-22 (×24): 10 mg via INTRAVENOUS
  Filled 2021-08-26 (×24): qty 2

## 2021-08-26 MED ORDER — MAGNESIUM SULFATE 2 GM/50ML IV SOLN
2.0000 g | Freq: Once | INTRAVENOUS | Status: AC
  Administered 2021-08-26: 2 g via INTRAVENOUS
  Filled 2021-08-26: qty 50

## 2021-08-26 NOTE — Progress Notes (Signed)
Progress Note    Hunter Ward   [id number]  DOB: 28-Mar-1951  DOA: 08/24/2021     1  PCP: Gaynelle Arabian, MD  Initial CC: abdominal pain, N/V  Hospital Course: Hunter Ward is a 71 year old male with PMH pancreatitis, HTN, hx CCY (~30 yrs ago), deafness (uses ASL) who presented to the hospital with worsening abdomial pain and inability to keep any food down the past 3 days. His last meal kept down was on 08/22/21.  He was recently hospitalized from 08/05/21 to 08/12/21 due to gallstone pancreatitis.  Imaging at that time showed a sizeable gallbladder remnant in the GB fossa with evidence of severe acute pancreatitis due to small retained gallstones within the fossa; MRCP also showed choledocholithiasis with 2 stones within the CBD. He then underwent ERCP on 08/07/2021 removing 2 stones, sphincterotomy performed, and placement of a plastic stent in the ventral pancreatic duct. Plan at discharge was for patient to follow-up at [facility] with advanced GI and surgery for definitive stone removal from gallbladder remnant.  He was discharged on 08/12/2021 and due to minimal recovery at home with recurrence and ongoing abdominal pain with inability to tolerate nutrition, he has presented back for further evaluation.  On admission this hospitalization, he underwent repeat CT abdomen/pelvis.  This showed progressive necrotizing pancreatitis with concern for phlegmon or abscess development measuring 8 x 5.2 cm.  There was also suspected splenic vein thrombosis on CT for which he was started on heparin drip for.  Persistent gallstones were noted in the gallbladder remnant and stable common hepatic duct dilation. Liver Doppler was then performed which showed patent portal and splenic veins, therefore heparin drip was discontinued at this time.  Interval History:  No events overnight.  Denied any significant nausea or vomiting.  Abdominal pain seems stable.  Explained to him the plan about going to radiology  this morning for tube advancement and confirmation of location then starting tube feeds.  He had no questions. iPad interpreter used for communication.  ROS: Constitutional: negative for chills and fevers, Respiratory: negative for cough and sputum, Cardiovascular: negative for chest pain, and Gastrointestinal: positive for abdominal pain  Assessment & Plan: * Necrotizing pancreatitis - per CT A/P on 9/15: "Progressive necrotizing pancreatitis as above, with 8.0 x 5.2 cm suspected abscess replacing the pancreatic head. Multilocular fluid collections along the pancreatic body may reflect further necrotic changes versus organizing pseudocysts. There is only minimal normal enhancing pancreatic tissue identified." - unable to be drained/approachable via IR; recommended conservative management at this time; possibly can re-image in a few days if no significant improvement or any worsening - continue zosyn for now - GI also following, appreciate assistance  - continue NPO, IVF, pain/nausea control  - splenic vein thrombosis ruled out, d/c heparin drip  - NJT placed on 9/16; placement unclear from xray; requested for tube advancement and confirmation in place with IR on 9/17; will start TF after placement confirmed   HTN (hypertension) - BP currently low/normal - Hold off on any antihypertensive agents at this time - continue LR  Deafness - ASL interpreter used for communication   Mild protein malnutrition ([place]) - Patient's BMI is Body mass index is 22.74 kg/m.. - Patient has the following signs/symptoms consistent with PCM: (fat loss, muscle loss, muscle wasting). - check pre-albumin (<5 mg/dL)  Hyponatremia - Sodium 132 on admission - Presumed hypovolemic hyponatremia from GI losses due to nausea/vomiting and poor oral intake - continue LR   Normocytic anemia - baseline  Hgb was ~14 g/dL in August however has now downtrended to ~10 g/dL; no overt bleeding reported; likely due to acute  and prolonged illness - continue trending Hgb    Old records reviewed in assessment of this patient  Antimicrobials: Zosyn 08/24/2021 >> current  DVT prophylaxis: Lovenox enoxaparin (LOVENOX) injection 40 mg Start: 08/26/21 1000   Code Status:   Code Status: Full Code Family Communication: family bedside 9/16  Disposition Plan: Status is: Inpatient  Remains inpatient appropriate because:Ongoing active pain requiring inpatient pain management, IV treatments appropriate due to intensity of illness or inability to take PO, and Inpatient level of care appropriate due to severity of illness  Dispo: The patient is from: Home              Anticipated d/c is to: Home              Patient currently is not medically stable to d/c.   Difficult to place patient No Risk of unplanned readmission score: Unplanned Admission- Pilot do not use: 14.73   Objective: Blood pressure (!) 95/58, pulse 72, temperature 99.2 F (37.3 C), temperature source Oral, resp. rate 15, height '5\' 10"'$  (1.778 m), weight 71.9 kg, SpO2 96 %.  Examination: General appearance: alert, cooperative, and no distress Head: Normocephalic, without obvious abnormality, atraumatic Eyes:  EOMI Lungs: clear to auscultation bilaterally Heart: regular rate and rhythm and S1, S2 normal Abdomen:  soft, ND, TTP in upper quadrants, improved from prior exam, no guarding; BS present but mildly hypoactive  Extremities:  no edema Skin: mobility and turgor normal Neurologic: Grossly normal  Consultants:  GI  Procedures:    Data Reviewed: I have personally reviewed following labs and imaging studies Results for orders placed or performed during the hospital encounter of 08/24/21 (from the past 24 hour(s))  Prealbumin     Status: Abnormal   Collection Time: 08/25/21  2:55 PM  Result Value Ref Range   Prealbumin <5 (L) 18 - 38 mg/dL  CBC WITH DIFFERENTIAL     Status: Abnormal   Collection Time: 08/26/21  5:17 AM  Result Value Ref  Range   WBC 12.4 (H) 4.0 - 10.5 K/uL   RBC 2.99 (L) 4.22 - 5.81 MIL/uL   Hemoglobin 9.6 (L) 13.0 - 17.0 g/dL   HCT 29.0 (L) 39.0 - 52.0 %   MCV 97.0 80.0 - 100.0 fL   MCH 32.1 26.0 - 34.0 pg   MCHC 33.1 30.0 - 36.0 g/dL   RDW 13.0 11.5 - 15.5 %   Platelets 175 150 - 400 K/uL   nRBC 0.0 0.0 - 0.2 %   Neutrophils Relative % 84 %   Neutro Abs 10.5 (H) 1.7 - 7.7 K/uL   Lymphocytes Relative 8 %   Lymphs Abs 1.0 0.7 - 4.0 K/uL   Monocytes Relative 7 %   Monocytes Absolute 0.9 0.1 - 1.0 K/uL   Eosinophils Relative 0 %   Eosinophils Absolute 0.0 0.0 - 0.5 K/uL   Basophils Relative 0 %   Basophils Absolute 0.0 0.0 - 0.1 K/uL   Immature Granulocytes 1 %   Abs Immature Granulocytes 0.09 (H) 0.00 - 0.07 K/uL  Procalcitonin     Status: None   Collection Time: 08/26/21  5:17 AM  Result Value Ref Range   Procalcitonin 0.43 ng/mL  Basic metabolic panel     Status: Abnormal   Collection Time: 08/26/21  5:17 AM  Result Value Ref Range   Sodium 137 135 - 145 mmol/L  Potassium 4.0 3.5 - 5.1 mmol/L   Chloride 103 98 - 111 mmol/L   CO2 25 22 - 32 mmol/L   Glucose, Bld 97 70 - 99 mg/dL   BUN 13 8 - 23 mg/dL   Creatinine, Ser 0.72 0.61 - 1.24 mg/dL   Calcium 8.3 (L) 8.9 - 10.3 mg/dL   GFR, Estimated >60 >60 mL/min   Anion gap 9 5 - 15  Magnesium     Status: None   Collection Time: 08/26/21  5:17 AM  Result Value Ref Range   Magnesium 1.7 1.7 - 2.4 mg/dL    Recent Results (from the past 240 hour(s))  Resp Panel by RT-PCR (Flu A&B, Covid) Nasopharyngeal Swab     Status: None   Collection Time: 08/24/21  1:37 PM   Specimen: Nasopharyngeal Swab; Nasopharyngeal(NP) swabs in vial transport medium  Result Value Ref Range Status   SARS Coronavirus 2 by RT PCR NEGATIVE NEGATIVE Final    Comment: (NOTE) SARS-CoV-2 target nucleic acids are NOT DETECTED.  The SARS-CoV-2 RNA is generally detectable in upper respiratory specimens during the acute phase of infection. The lowest concentration of  SARS-CoV-2 viral copies this assay can detect is 138 copies/mL. A negative result does not preclude SARS-Cov-2 infection and should not be used as the sole basis for treatment or other patient management decisions. A negative result may occur with  improper specimen collection/handling, submission of specimen other than nasopharyngeal swab, presence of viral mutation(s) within the areas targeted by this assay, and inadequate number of viral copies(<138 copies/mL). A negative result must be combined with clinical observations, patient history, and epidemiological information. The expected result is Negative.  Fact Sheet for Patients:  EntrepreneurPulse.com.au  Fact Sheet for Healthcare Providers:  IncredibleEmployment.be  This test is no t yet approved or cleared by the [place] FDA and  has been authorized for detection and/or diagnosis of SARS-CoV-2 by FDA under an Emergency Use Authorization (EUA). This EUA will remain  in effect (meaning this test can be used) for the duration of the COVID-19 declaration under Section 564(b)(1) of the Act, 21 U.S.C.section 360bbb-3(b)(1), unless the authorization is terminated  or revoked sooner.       Influenza A by PCR NEGATIVE NEGATIVE Final   Influenza B by PCR NEGATIVE NEGATIVE Final    Comment: (NOTE) The Xpert Xpress SARS-CoV-2/FLU/RSV plus assay is intended as an aid in the diagnosis of influenza from Nasopharyngeal swab specimens and should not be used as a sole basis for treatment. Nasal washings and aspirates are unacceptable for Xpert Xpress SARS-CoV-2/FLU/RSV testing.  Fact Sheet for Patients: EntrepreneurPulse.com.au  Fact Sheet for Healthcare Providers: IncredibleEmployment.be  This test is not yet approved or cleared by the [place] FDA and has been authorized for detection and/or diagnosis of SARS-CoV-2 by FDA under an Emergency Use  Authorization (EUA). This EUA will remain in effect (meaning this test can be used) for the duration of the COVID-19 declaration under Section 564(b)(1) of the Act, 21 U.S.C. section 360bbb-3(b)(1), unless the authorization is terminated or revoked.  Performed at [facility], [place] [address], [place], [place] [address]      Radiology Studies: DG Abd 1 View  Result Date: 08/25/2021 CLINICAL DATA:  Feeding tube placement EXAM: ABDOMEN - 1 VIEW COMPARISON:  CT 08/24/2021 FINDINGS: An enteric tube has been placed with tip in the right upper quadrant consistent with location in the distal stomach or duodenal bulb. Probable biliary stent demonstrated in the right upper quadrant  without change in position. Gas-filled nondistended colon, possibly ileus. IMPRESSION: Enteric tube tip is in the right upper quadrant suggesting location in the distal stomach or duodenal bulb region. Electronically Signed   By: Lucienne Capers M.D.   On: 08/25/2021 19:37   CT ABDOMEN PELVIS W CONTRAST  Result Date: 08/24/2021 CLINICAL DATA:  Abdominal pain, fever, recent pancreatitis with ERCP and stent placement, nausea and vomiting EXAM: CT ABDOMEN AND PELVIS WITH CONTRAST TECHNIQUE: Multidetector CT imaging of the abdomen and pelvis was performed using the standard protocol following bolus administration of intravenous contrast. CONTRAST:  167m OMNIPAQUE IOHEXOL 350 MG/ML SOLN COMPARISON:  08/06/2021, 08/05/2021 FINDINGS: Lower chest: There are small bilateral pleural effusions volume estimated less than 100 cc each. Minimal dependent lower lobe atelectasis. Hepatobiliary: The gallbladder remnant seen on prior exam is again identified, with multiple calcified gallstones measuring up to 1 cm in size. Dilation of the common hepatic duct measuring 14 mm is unchanged. No significant intrahepatic biliary duct dilation. No evidence of downstream calculi within the common bile duct. Pancreas: Since the previous  examination, there has been significant progression of the inflammatory changes surrounding the pancreas. Within the pancreatic head, there has been development of multiloculated fluid collection within the pancreatic parenchyma, with punctate foci of gas, worrisome for necrotic pancreatitis and subsequent abscess. This measures up to 8.0 x 5.2 cm reference image 34/2. Multilocular fluid collections are seen involving the ventral aspect of the pancreatic body and tail, measuring up to 2.4 cm in thickness, consistent with either further necrotic changes or multilocular pseudocyst. There is only minimal normal enhancing pancreatic tissue identified on this study. Pancreatic duct stent is seen extending from the pancreatic head into the duodenal lumen. Spleen: Normal in size without focal abnormality. Adrenals/Urinary Tract: Stable left adrenal adenoma. Right adrenals unremarkable. The kidneys enhance normally and symmetrically, without urinary tract calculi or obstructive uropathy. The bladder is decompressed. Stomach/Bowel: No bowel obstruction or ileus. Normal appendix right lower quadrant. No bowel wall thickening or inflammatory change. Vascular/Lymphatic: The splenic vein is difficult to visualize, likely secondary to extrinsic compression by the inflammatory process within the pancreatic bed. Splenic vein occlusion cannot be excluded, and there are new collateral venous structures in the left upper quadrant consistent with varices. There is extrinsic compression upon the SMV the near the portal confluence, but the SMV remains patent. The portal vein is widely patent. Mild atherosclerosis of the aorta again noted. No pathologic adenopathy within the abdomen or pelvis. Reproductive: Prostate is unremarkable. Other: There is a small amount of free fluid within the lower pelvis. No free intraperitoneal gas. No abdominal wall hernia. Musculoskeletal: No acute or destructive bony lesions. Reconstructed images  demonstrate no additional findings. IMPRESSION: 1. Progressive necrotizing pancreatitis as above, with 8.0 x 5.2 cm suspected abscess replacing the pancreatic head. Multilocular fluid collections along the pancreatic body may reflect further necrotic changes versus organizing pseudocysts. There is only minimal normal enhancing pancreatic tissue identified. 2. Suspected splenic vein thrombosis, with extrinsic compression on the superior mesenteric vein by the pancreatic abscess. Portal vein remains patent. 3. Stable common hepatic duct dilation. Persistent gallstones within the gallbladder remnant, with no evidence of downstream choledocholithiasis. 4. Indwelling pancreatic duct stent extending into the duodenal lumen. 5. Trace pelvic free fluid. 6. Small bilateral pleural effusions. 7.  Aortic Atherosclerosis (ICD10-I70.0). Electronically Signed   By: MRanda NgoM.D.   On: 08/24/2021 18:50   [place]IVER DOPPLER LIMITED (PV OR SINGLE ART/VEIN)  Result Date: 08/25/2021 CLINICAL DATA:  Splenic vein thrombosis. Acute necrotizing pancreatitis. EXAM: DUPLEX ULTRASOUND OF LIVER TECHNIQUE: Color and duplex Doppler ultrasound was performed to evaluate the hepatic in-flow and out-flow vessels. COMPARISON:  CT Abdomen Pelvis, 08/24/2021. FINDINGS: Suboptimal evaluation secondary to poor acoustic penetration. Liver: Normal parenchymal echogenicity. Normal hepatic contour without nodularity. No focal lesion, mass or intrahepatic biliary ductal dilatation. Main Portal Vein size: 1.0 cm Portal Vein Velocities Main Prox:  22.7 cm/sec Main Mid: 20.4 cm/sec Main Dist:  13.9 cm/sec Right: 10.2 cm/sec Left: 10.8 cm/sec Hepatic Vein Velocities Right:  19.9 cm/sec Middle:  22.7 cm/sec Left:  35.2 cm/sec IVC: Present and patent with normal respiratory phasicity. Hepatic Artery Velocity:  196 cm/sec Splenic Vein Velocity:  15.8 cm/sec Spleen: 8.7 x 11.0 x 5.8 cm with a total volume of 288 cm^3 (411 cm^3 is upper limit normal). Small  inferior pole accessory spleen. Portal Vein Occlusion/Thrombus: No Splenic Vein Occlusion/Thrombus: No Ascites: No large volume intra-abdominal ascites. Varices: None Incidental, bilateral pleural effusions. Trace perihepatic ascites. CBD dilation, measuring up to 1.3 cm and likely post cholecystectomy reservoir effect. IMPRESSION: 1. Patent portal and splenic veins. 2. Incidental bilateral pleural effusions and trace perihepatic ascites. No large volume intra-abdominal ascites. Michaelle Birks, MD [facility] Electronically Signed   By: Michaelle Birks M.D.   On: 08/25/2021 08:08   DG Abd 1 View  Final Result    US LIVER DOPPLER LIMITED (PV OR SINGLE ART/VEIN)  Final Result    CT ABDOMEN PELVIS W CONTRAST  Final Result    DG Naso G Tube Plc W/Fl-No Rad    (Results Pending)    Scheduled Meds:  enoxaparin (LOVENOX) injection  40 mg Subcutaneous Daily   feeding supplement  237 mL Oral BID BM   pantoprazole (PROTONIX) IV  40 mg Intravenous Q24H   PRN Meds: acetaminophen **OR** acetaminophen, HYDROmorphone (DILAUDID) injection, ondansetron **OR** ondansetron (ZOFRAN) IV, phenol Continuous Infusions:  lactated ringers 100 mL/hr at 08/26/21 0825   piperacillin-tazobactam (ZOSYN)  IV 3.375 g (08/26/21 0827)     LOS: 1 day  Time spent: Greater than 50% of the 35 minute visit was spent in counseling/coordination of care for the patient as laid out in the A&P.   Dwyane Dee, MD [facility] 08/26/2021, 12:18 PM

## 2021-08-26 NOTE — Plan of Care (Signed)

## 2021-08-26 NOTE — Progress Notes (Signed)
[facility] Gastroenterology Progress Note  NACHMAN GUTTERY 70 y.o. [birth year]  CC: Necrotizing pancreatitis   Subjective: Patient seen and examined at bedside.  Interpreter software used for sign language.  Patient is feeling better today.  Feeling more relaxed.  Denies abdominal pain.  ROS : Negative for chest pain and shortness of breath   Objective: Vital signs in last 24 hours: Vitals:   08/26/21 0503 08/26/21 0521  BP: (!) 95/58   Pulse: 72   Resp:  15  Temp: 99.2 F (37.3 C)   SpO2: 96%     Physical Exam:  General -resting comfortably.  Not in acute distress.  NG tube in place Abdomen : Soft, nontender, nondistended, bowel sounds present.  No peritoneal signs Neuro : Alert and oriented x3 Psych : Mood and affect normal  Lab Results: Recent Labs    08/25/21 0419 08/26/21 0517  NA 135 137  K 4.1 4.0  CL 101 103  CO2 25 25  GLUCOSE 113* 97  BUN 12 13  CREATININE 0.77 0.72  CALCIUM 8.3* 8.3*  MG  --  1.7   Recent Labs    08/24/21 1340 08/25/21 0419  AST 28 17  ALT 32 21  ALKPHOS 82 65  BILITOT 1.1 0.8  PROT 7.1 5.7*  ALBUMIN 3.0* 2.4*   Recent Labs    08/25/21 0419 08/26/21 0517  WBC 12.6* 12.4*  NEUTROABS 11.0* 10.5*  HGB 9.9* 9.6*  HCT 29.4* 29.0*  MCV 96.4 97.0  PLT 165 175   No results for input(s): LABPROT, INR in the last 72 hours.    Assessment/Plan: -Necrotizing pancreatitis with suspected 8 cm abscess around the pancreatic head. -Pancreatic pseudocyst -Leukocytosis  Recommendation ----------------------- -Continue IV hydration and IV antibiotics -Start postpyloric feeding once proper placement is confirmed -Continue other supportive care -GI will follow   Otis Brace MD, [place] 08/26/2021, 8:56 AM  Contact #  [phone]

## 2021-08-27 ENCOUNTER — Inpatient Hospital Stay: Payer: Self-pay

## 2021-08-27 DIAGNOSIS — K8591 Acute pancreatitis with uninfected necrosis, unspecified: Secondary | ICD-10-CM | POA: Diagnosis not present

## 2021-08-27 LAB — COMPREHENSIVE METABOLIC PANEL
ALT: 16 U/L (ref 0–44)
AST: 15 U/L (ref 15–41)
Albumin: 2.1 g/dL — ABNORMAL LOW (ref 3.5–5.0)
Alkaline Phosphatase: 50 U/L (ref 38–126)
Anion gap: 6 (ref 5–15)
BUN: 15 mg/dL (ref 8–23)
CO2: 27 mmol/L (ref 22–32)
Calcium: 7.8 mg/dL — ABNORMAL LOW (ref 8.9–10.3)
Chloride: 101 mmol/L (ref 98–111)
Creatinine, Ser: 0.63 mg/dL (ref 0.61–1.24)
GFR, Estimated: >60 mL/min (ref >60)
Glucose, Bld: 86 mg/dL (ref 70–99)
Potassium: 3.9 mmol/L (ref 3.5–5.1)
Sodium: 134 mmol/L — ABNORMAL LOW (ref 135–145)
Total Bilirubin: 0.9 mg/dL (ref 0.3–1.2)
Total Protein: 5.1 g/dL — ABNORMAL LOW (ref 6.5–8.1)

## 2021-08-27 LAB — CBC WITH DIFFERENTIAL/PLATELET
Abs Immature Granulocytes: 0.07 10*3/uL (ref 0.00–0.07)
Basophils Absolute: 0 10*3/uL (ref 0.0–0.1)
Basophils Relative: 0 %
Eosinophils Absolute: 0 10*3/uL (ref 0.0–0.5)
Eosinophils Relative: 0 %
HCT: 31.3 % — ABNORMAL LOW (ref 39.0–52.0)
Hemoglobin: 10.3 g/dL — ABNORMAL LOW (ref 13.0–17.0)
Immature Granulocytes: 1 %
Lymphocytes Relative: 9 %
Lymphs Abs: 1 10*3/uL (ref 0.7–4.0)
MCH: 32.3 pg (ref 26.0–34.0)
MCHC: 32.9 g/dL (ref 30.0–36.0)
MCV: 98.1 fL (ref 80.0–100.0)
Monocytes Absolute: 0.7 10*3/uL (ref 0.1–1.0)
Monocytes Relative: 6 %
Neutro Abs: 10.4 10*3/uL — ABNORMAL HIGH (ref 1.7–7.7)
Neutrophils Relative %: 84 %
Platelets: 179 10*3/uL (ref 150–400)
RBC: 3.19 MIL/uL — ABNORMAL LOW (ref 4.22–5.81)
RDW: 13 % (ref 11.5–15.5)
WBC: 12.2 10*3/uL — ABNORMAL HIGH (ref 4.0–10.5)
nRBC: 0 % (ref 0.0–0.2)

## 2021-08-27 LAB — PROCALCITONIN: Procalcitonin: 0.35 ng/mL

## 2021-08-27 LAB — MAGNESIUM: Magnesium: 1.9 mg/dL (ref 1.7–2.4)

## 2021-08-27 MED ORDER — LIDOCAINE VISCOUS HCL 2 % MT SOLN
15.0000 mL | Freq: Four times a day (QID) | OROMUCOSAL | Status: DC | PRN
  Administered 2021-08-30 – 2021-08-31 (×2): 15 mL via OROMUCOSAL
  Filled 2021-08-27 (×4): qty 15

## 2021-08-27 NOTE — Progress Notes (Signed)
Assuming care of patient from [place], [place]. Agree with previously documented assessment. Will continue plan of care.

## 2021-08-27 NOTE — Progress Notes (Signed)
[facility] Gastroenterology Progress Note  Hunter Ward 70 y.o. 1951-02-26  CC: Necrotizing pancreatitis   Subjective: Patient seen and examined at bedside.  Interpreter software used for sign language.  Yesterday's events noted.  No acute issues overnight.  Unable to advance feeding tube placement because of duodenal obstruction from pancreatic abscess/extrinsic compression  ROS : Negative for chest pain and shortness of breath   Objective: Vital signs in last 24 hours: Vitals:   08/27/21 0244 08/27/21 0419  BP: (!) 96/57 95/61  Pulse:  61  Resp:  19  Temp:  98.2 F (36.8 C)  SpO2:  94%    Physical Exam:  General -resting comfortably.  Not in acute distress.  NG tube in place Abdomen : Soft, nontender, nondistended, bowel sounds present.  No peritoneal signs Neuro : Alert and oriented x3 Psych : Mood and affect normal  Lab Results: Recent Labs    08/26/21 0517 08/27/21 0511  NA 137 134*  K 4.0 3.9  CL 103 101  CO2 25 27  GLUCOSE 97 86  BUN 13 15  CREATININE 0.72 0.63  CALCIUM 8.3* 7.8*  MG 1.7 1.9   Recent Labs    08/25/21 0419 08/27/21 0511  AST 17 15  ALT 21 16  ALKPHOS 65 50  BILITOT 0.8 0.9  PROT 5.7* 5.1*  ALBUMIN 2.4* 2.1*   Recent Labs    08/26/21 0517 08/27/21 0511  WBC 12.4* 12.2*  NEUTROABS 10.5* 10.4*  HGB 9.6* 10.3*  HCT 29.0* 31.3*  MCV 97.0 98.1  PLT 175 179   No results for input(s): LABPROT, INR in the last 72 hours.    Assessment/Plan: -Necrotizing pancreatitis with suspected 8 cm abscess around the pancreatic head. -Pancreatic pseudocyst -Leukocytosis  Recommendation ----------------------- -Continue IV hydration and IV antibiotics -Case discussed with hospitalist as well as IR physician yesterday.  Unable to advance feeding tube beyond duodenum because of significant mass-effect in the second portion of the duodenum probably related to pancreatic abscess.  No contrast passed into distal aspect of the second portion of  the duodenum.  -These findings were discussed with the patient today.  Because there is no contrast going through the second portion of the duodenum, it will be difficult to put a duodenal stent and also duodenal stent would be somewhat contraindicated because of pancreatic abscess and significant edema in that location.  -Recommend to start TPN for now  -According to interventional radiology, hold off on upper GI series for few days.  Consider doing upper GI series in the next few days hopefully once pancreatitis, pancreatic abscess and pancreatic edema improves which will allow passage of contrast into the second portion of duodenum.   Otis Brace MD, [place] 08/27/2021, 11:47 AM  Contact #  [phone]

## 2021-08-27 NOTE — Progress Notes (Signed)
PHARMACY - TOTAL PARENTERAL NUTRITION CONSULT NOTE   Indication:  NPO for necrotizing pancreatitis  Patient Measurements: Height: 5' 10"  (177.8 cm) Weight: 71.9 kg (158 lb 8.2 oz) IBW/kg (Calculated) : 73 TPN AdjBW (KG): 71.9 Body mass index is 22.74 kg/m. Usual Weight:   Assessment: 18 yoM re-admitted for necrotizing pancreatitis and is NPO.  Initially planning for post-pyloric tube feeds.  NGT in place but currently not to be used; not fully passed pylorus plus significant duodenal stricture and patient would not tolerate TF at this time.  Patient now ~6 days since adequate nutrition.  Glucose / Insulin: No hx DM or medications; Glucose < 100 Electrolytes: Na 134, others WNL Renal: SCr, BUN stable, WNL Hepatic: LFTs, Tbili, Alk Phos WNL Prealbumin < 5 (9/16) Intake / Output; MIVF:  LR @ 100 ml/hr; UOP incompletely documented (661m out, 1 unmeasured occurrence) GI Imaging: GI Surgeries / Procedures: Pancreatic stent placed 08/07/21, NG tube placed 9/16  Central access: PICC line ordered TPN start date: 9/19  Nutritional Goals:   Goal TPN rate is 80 mL/hr (provides 92 g of protein and 1958 kcals per day)  RD Assessment:  Last on 08/25/21 Estimated Needs Total Energy Estimated Needs: 1800-2000 Total Protein Estimated Needs: 85-95g Total Fluid Estimated Needs: 2L/day  Current Nutrition:  NPO  Plan:  TPN consult placed after ordering cut-off time today; pharmacy to place orders on 9/19 pending PICC line placement.  Continue MIVF LR at 100 mL/hr Monitor TPN labs on Mon/Thurs  CGretta ArabPharmD, BCPS Clinical Pharmacist [facility] main pharmacy [phone]/18/2022 12:09 PM

## 2021-08-27 NOTE — Progress Notes (Signed)
Progress Note    Hunter Ward   [id number]  DOB: 1951/09/04  DOA: 08/24/2021     2  PCP: Gaynelle Arabian, MD  Initial CC: abdominal pain, N/V  Hospital Course: Hunter Ward is a 70 year old male with PMH pancreatitis, HTN, hx CCY (~30 yrs ago), deafness (uses ASL) who presented to the hospital with worsening abdomial pain and inability to keep any food down the past 3 days. His last meal kept down was on 08/22/21.  He was recently hospitalized from 08/05/21 to 08/12/21 due to gallstone pancreatitis.  Imaging at that time showed a sizeable gallbladder remnant in the GB fossa with evidence of severe acute pancreatitis due to small retained gallstones within the fossa; MRCP also showed choledocholithiasis with 2 stones within the CBD. He then underwent ERCP on 08/07/2021 removing 2 stones, sphincterotomy performed, and placement of a plastic stent in the ventral pancreatic duct. Plan at discharge was for patient to follow-up at [facility] with advanced GI and surgery for definitive stone removal from gallbladder remnant.  He was discharged on 08/12/2021 and due to minimal recovery at home with recurrence and ongoing abdominal pain with inability to tolerate nutrition, he has presented back for further evaluation.  On admission this hospitalization, he underwent repeat CT abdomen/pelvis.  This showed progressive necrotizing pancreatitis with concern for phlegmon or abscess development measuring 8 x 5.2 cm.  There was also suspected splenic vein thrombosis on CT for which he was started on heparin drip for.  Persistent gallstones were noted in the gallbladder remnant and stable common hepatic duct dilation. Liver Doppler was then performed which showed patent portal and splenic veins, therefore heparin drip was discontinued at this time.  Interval History:  No events overnight.  Family present this morning during 2nd round on patient. He's still having some abdominal pain; denies N/V. Throat sore from  tube being in.   ROS: Constitutional: negative for chills and fevers, Respiratory: negative for cough and sputum, Cardiovascular: negative for chest pain, and Gastrointestinal: positive for abdominal pain  Assessment & Plan: * Necrotizing pancreatitis - per CT A/P on 9/15: "Progressive necrotizing pancreatitis as above, with 8.0 x 5.2 cm suspected abscess replacing the pancreatic head. Multilocular fluid collections along the pancreatic body may reflect further necrotic changes versus organizing pseudocysts. There is only minimal normal enhancing pancreatic tissue identified." - unable to be drained/approachable via IR; recommended conservative management at this time; possibly can re-image in a few days if no significant improvement or any worsening - continue zosyn for now - GI also following, appreciate assistance  - continue NPO, IVF, pain/nausea control  - splenic vein thrombosis ruled out, d/c heparin drip  - NGT in place but currently not to be used; not fully passed pylorus plus significant duodenal stricture and patient would not tolerate TF at this time - patient now ~6 days since adequate nutrition; discussed with GI as well; will place PICC and start TPN for now (d/c LR once TPN started) - continue bowel rest and upper GI series later this coming week  HTN (hypertension) - BP currently low/normal - Hold off on any antihypertensive agents at this time - continue LR  Deafness - ASL interpreter used for communication   Mild protein malnutrition ([place]) - Patient's BMI is Body mass index is 22.74 kg/m.. - Patient has the following signs/symptoms consistent with PCM: (fat loss, muscle loss, muscle wasting). - check pre-albumin (<5 mg/dL) - starting TPN after PICC placed   Hyponatremia - Sodium 132 on  admission - Presumed hypovolemic hyponatremia from GI losses due to nausea/vomiting and poor oral intake - continue LR until TPN started then d/c LR  Normocytic anemia -  baseline Hgb was ~14 g/dL in August however has now downtrended to ~10 g/dL; no overt bleeding reported; likely due to acute and prolonged illness - continue trending Hgb    Old records reviewed in assessment of this patient  Antimicrobials: Zosyn 08/24/2021 >> current  DVT prophylaxis: Lovenox enoxaparin (LOVENOX) injection 40 mg Start: 08/26/21 1000   Code Status:   Code Status: Full Code Family Communication: family bedside 9/16  Disposition Plan: Status is: Inpatient  Remains inpatient appropriate because:Ongoing active pain requiring inpatient pain management, IV treatments appropriate due to intensity of illness or inability to take PO, and Inpatient level of care appropriate due to severity of illness  Dispo: The patient is from: Home              Anticipated d/c is to: Home              Patient currently is not medically stable to d/c.   Difficult to place patient No Risk of unplanned readmission score: Unplanned Admission- Pilot do not use: 17.39   Objective: Blood pressure 95/61, pulse 61, temperature 98.2 F (36.8 C), temperature source Oral, resp. rate 19, height '5\' 10"'$  (1.778 m), weight 71.9 kg, SpO2 94 %.  Examination: General appearance: alert, cooperative, and no distress Head: Normocephalic, without obvious abnormality, atraumatic Eyes:  EOMI Lungs: clear to auscultation bilaterally Heart: regular rate and rhythm and S1, S2 normal Abdomen:  soft, ND, TTP in upper quadrants, improved from prior exam, no guarding; BS present but mildly hypoactive  Extremities:  no edema Skin: mobility and turgor normal Neurologic: Grossly normal  Consultants:  GI  Procedures:    Data Reviewed: I have personally reviewed following labs and imaging studies Results for orders placed or performed during the hospital encounter of 08/24/21 (from the past 24 hour(s))  Procalcitonin     Status: None   Collection Time: 08/27/21  5:11 AM  Result Value Ref Range   Procalcitonin  0.35 ng/mL  Magnesium     Status: None   Collection Time: 08/27/21  5:11 AM  Result Value Ref Range   Magnesium 1.9 1.7 - 2.4 mg/dL  Comprehensive metabolic panel     Status: Abnormal   Collection Time: 08/27/21  5:11 AM  Result Value Ref Range   Sodium 134 (L) 135 - 145 mmol/L   Potassium 3.9 3.5 - 5.1 mmol/L   Chloride 101 98 - 111 mmol/L   CO2 27 22 - 32 mmol/L   Glucose, Bld 86 70 - 99 mg/dL   BUN 15 8 - 23 mg/dL   Creatinine, Ser 0.63 0.61 - 1.24 mg/dL   Calcium 7.8 (L) 8.9 - 10.3 mg/dL   Total Protein 5.1 (L) 6.5 - 8.1 g/dL   Albumin 2.1 (L) 3.5 - 5.0 g/dL   AST 15 15 - 41 U/L   ALT 16 0 - 44 U/L   Alkaline Phosphatase 50 38 - 126 U/L   Total Bilirubin 0.9 0.3 - 1.2 mg/dL   GFR, Estimated >60 >60 mL/min   Anion gap 6 5 - 15  CBC with Differential/Platelet     Status: Abnormal   Collection Time: 08/27/21  5:11 AM  Result Value Ref Range   WBC 12.2 (H) 4.0 - 10.5 K/uL   RBC 3.19 (L) 4.22 - 5.81 MIL/uL   Hemoglobin 10.3 (L)  13.0 - 17.0 g/dL   HCT 31.3 (L) 39.0 - 52.0 %   MCV 98.1 80.0 - 100.0 fL   MCH 32.3 26.0 - 34.0 pg   MCHC 32.9 30.0 - 36.0 g/dL   RDW 13.0 11.5 - 15.5 %   Platelets 179 150 - 400 K/uL   nRBC 0.0 0.0 - 0.2 %   Neutrophils Relative % 84 %   Neutro Abs 10.4 (H) 1.7 - 7.7 K/uL   Lymphocytes Relative 9 %   Lymphs Abs 1.0 0.7 - 4.0 K/uL   Monocytes Relative 6 %   Monocytes Absolute 0.7 0.1 - 1.0 K/uL   Eosinophils Relative 0 %   Eosinophils Absolute 0.0 0.0 - 0.5 K/uL   Basophils Relative 0 %   Basophils Absolute 0.0 0.0 - 0.1 K/uL   Immature Granulocytes 1 %   Abs Immature Granulocytes 0.07 0.00 - 0.07 K/uL    Recent Results (from the past 240 hour(s))  Resp Panel by RT-PCR (Flu A&B, Covid) Nasopharyngeal Swab     Status: None   Collection Time: 08/24/21  1:37 PM   Specimen: Nasopharyngeal Swab; Nasopharyngeal(NP) swabs in vial transport medium  Result Value Ref Range Status   SARS Coronavirus 2 by RT PCR NEGATIVE NEGATIVE Final    Comment:  (NOTE) SARS-CoV-2 target nucleic acids are NOT DETECTED.  The SARS-CoV-2 RNA is generally detectable in upper respiratory specimens during the acute phase of infection. The lowest concentration of SARS-CoV-2 viral copies this assay can detect is 138 copies/mL. A negative result does not preclude SARS-Cov-2 infection and should not be used as the sole basis for treatment or other patient management decisions. A negative result may occur with  improper specimen collection/handling, submission of specimen other than nasopharyngeal swab, presence of viral mutation(s) within the areas targeted by this assay, and inadequate number of viral copies(<138 copies/mL). A negative result must be combined with clinical observations, patient history, and epidemiological information. The expected result is Negative.  Fact Sheet for Patients:  EntrepreneurPulse.com.au  Fact Sheet for Healthcare Providers:  IncredibleEmployment.be  This test is no t yet approved or cleared by the [place] FDA and  has been authorized for detection and/or diagnosis of SARS-CoV-2 by FDA under an Emergency Use Authorization (EUA). This EUA will remain  in effect (meaning this test can be used) for the duration of the COVID-19 declaration under Section 564(b)(1) of the Act, 21 U.S.C.section 360bbb-3(b)(1), unless the authorization is terminated  or revoked sooner.       Influenza A by PCR NEGATIVE NEGATIVE Final   Influenza B by PCR NEGATIVE NEGATIVE Final    Comment: (NOTE) The Xpert Xpress SARS-CoV-2/FLU/RSV plus assay is intended as an aid in the diagnosis of influenza from Nasopharyngeal swab specimens and should not be used as a sole basis for treatment. Nasal washings and aspirates are unacceptable for Xpert Xpress SARS-CoV-2/FLU/RSV testing.  Fact Sheet for Patients: EntrepreneurPulse.com.au  Fact Sheet for Healthcare  Providers: IncredibleEmployment.be  This test is not yet approved or cleared by the [place] FDA and has been authorized for detection and/or diagnosis of SARS-CoV-2 by FDA under an Emergency Use Authorization (EUA). This EUA will remain in effect (meaning this test can be used) for the duration of the COVID-19 declaration under Section 564(b)(1) of the Act, 21 U.S.C. section 360bbb-3(b)(1), unless the authorization is terminated or revoked.  Performed at [facility], [place] [address] [place], [place] [address]      Radiology Studies: DG Abd 1  View  Result Date: 08/25/2021 CLINICAL DATA:  Feeding tube placement EXAM: ABDOMEN - 1 VIEW COMPARISON:  CT 08/24/2021 FINDINGS: An enteric tube has been placed with tip in the right upper quadrant consistent with location in the distal stomach or duodenal bulb. Probable biliary stent demonstrated in the right upper quadrant without change in position. Gas-filled nondistended colon, possibly ileus. IMPRESSION: Enteric tube tip is in the right upper quadrant suggesting location in the distal stomach or duodenal bulb region. Electronically Signed   By: Lucienne Capers M.D.   On: 08/25/2021 19:37   DG Fluoro Rm 1-60 Min  Result Date: 08/26/2021 CLINICAL DATA:  Necrotizing pancreatitis. Advancement of feeding tube. EXAM: DG C-ARM 1-60 MIN CONTRAST:  20 mL dilute Omnipaque FLUOROSCOPY TIME:  Fluoroscopy Time:  5 minutes 30 seconds Radiation Exposure Index (if provided by the fluoroscopic device): 66.3 Number of Acquired Spot Images: 1 COMPARISON:  CT 08/24/2021 FINDINGS: Patient presented to radiology department with feeding tube in the distal aspect the stomach. Multiple attempts were made to advance the feeding tube into the second portion duodenum. The stylet was replaced with a stiff guidewire which also failed to advance the tip of the tube into the second portion duodenum. Water-soluble contrast was injected at  the first portion the duodenum. Majority of contrast flowed retrograde into the stomach. Only a thin trickle of contrast enters the proximal second portion duodenum. No contrast enters the distal second portion the duodenum. The feeding tube was left  in the distal stomach with stylet place. IMPRESSION: 1. Failure to pass the NG tube into the distal duodenum. 2. Favor edema or mass effect within the second portion the duodenum related to the pancreatic abscess. This is correlates with findings on CT 08/24/2021. Edema/mass effect prevents passage of the feeding tube. 3. Only a thin trickle of contrast injected passed into the proximal second portion duodenum indicating luminal narrowing either by edema or mass effect. No contrast passed into the distal aspect of the second portion of the duodenum. Findings conveyed toDAVID Devani Odonnel on 08/26/2021  at14:46. Electronically Signed   By: Suzy Bouchard M.D.   On: 08/26/2021 14:50   [place] EKG SITE RITE  Result Date: 08/27/2021 If Site Rite image not attached, placement could not be confirmed due to current cardiac rhythm.  [place] EKG SITE RITE  Final Result    DG Fluoro Rm 1-60 Min  Final Result    DG Abd 1 View  Final Result    US LIVER DOPPLER LIMITED (PV OR SINGLE ART/VEIN)  Final Result    CT ABDOMEN PELVIS W CONTRAST  Final Result      Scheduled Meds:  enoxaparin (LOVENOX) injection  40 mg Subcutaneous Daily   feeding supplement  237 mL Oral BID BM   pantoprazole (PROTONIX) IV  40 mg Intravenous Q24H   PRN Meds: acetaminophen **OR** acetaminophen, HYDROmorphone (DILAUDID) injection, lidocaine, ondansetron **OR** ondansetron (ZOFRAN) IV, phenol, prochlorperazine Continuous Infusions:  lactated ringers 100 mL/hr at 08/27/21 0636   piperacillin-tazobactam (ZOSYN)  IV 3.375 g (08/27/21 0931)     LOS: 2 days  Time spent: Greater than 50% of the 35 minute visit was spent in counseling/coordination of care for the patient as laid out in the A&P.    Dwyane Dee, MD [facility] 08/27/2021, 12:02 PM

## 2021-08-27 NOTE — Progress Notes (Signed)
Ify Rn notified via securechat that PICC most likey will be placed 08/28/21.  Will notify if any change in plans.

## 2021-08-28 DIAGNOSIS — K8591 Acute pancreatitis with uninfected necrosis, unspecified: Secondary | ICD-10-CM | POA: Diagnosis not present

## 2021-08-28 LAB — PHOSPHORUS: Phosphorus: 2.5 mg/dL (ref 2.5–4.6)

## 2021-08-28 LAB — CBC WITH DIFFERENTIAL/PLATELET
Abs Immature Granulocytes: 0.1 10*3/uL — ABNORMAL HIGH (ref 0.00–0.07)
Basophils Absolute: 0 10*3/uL (ref 0.0–0.1)
Basophils Relative: 0 %
Eosinophils Absolute: 0 10*3/uL (ref 0.0–0.5)
Eosinophils Relative: 0 %
HCT: 31.8 % — ABNORMAL LOW (ref 39.0–52.0)
Hemoglobin: 10.4 g/dL — ABNORMAL LOW (ref 13.0–17.0)
Immature Granulocytes: 1 %
Lymphocytes Relative: 10 %
Lymphs Abs: 1.2 10*3/uL (ref 0.7–4.0)
MCH: 32.2 pg (ref 26.0–34.0)
MCHC: 32.7 g/dL (ref 30.0–36.0)
MCV: 98.5 fL (ref 80.0–100.0)
Monocytes Absolute: 0.6 10*3/uL (ref 0.1–1.0)
Monocytes Relative: 5 %
Neutro Abs: 10 10*3/uL — ABNORMAL HIGH (ref 1.7–7.7)
Neutrophils Relative %: 84 %
Platelets: 177 10*3/uL (ref 150–400)
RBC: 3.23 MIL/uL — ABNORMAL LOW (ref 4.22–5.81)
RDW: 13.1 % (ref 11.5–15.5)
WBC: 11.9 10*3/uL — ABNORMAL HIGH (ref 4.0–10.5)
nRBC: 0 % (ref 0.0–0.2)

## 2021-08-28 LAB — COMPREHENSIVE METABOLIC PANEL
ALT: 14 U/L (ref 0–44)
AST: 15 U/L (ref 15–41)
Albumin: 2.1 g/dL — ABNORMAL LOW (ref 3.5–5.0)
Alkaline Phosphatase: 52 U/L (ref 38–126)
Anion gap: 8 (ref 5–15)
BUN: 15 mg/dL (ref 8–23)
CO2: 27 mmol/L (ref 22–32)
Calcium: 7.9 mg/dL — ABNORMAL LOW (ref 8.9–10.3)
Chloride: 102 mmol/L (ref 98–111)
Creatinine, Ser: 0.62 mg/dL (ref 0.61–1.24)
GFR, Estimated: >60 mL/min (ref >60)
Glucose, Bld: 78 mg/dL (ref 70–99)
Potassium: 4.5 mmol/L (ref 3.5–5.1)
Sodium: 137 mmol/L (ref 135–145)
Total Bilirubin: 0.9 mg/dL (ref 0.3–1.2)
Total Protein: 5.3 g/dL — ABNORMAL LOW (ref 6.5–8.1)

## 2021-08-28 LAB — MAGNESIUM: Magnesium: 1.8 mg/dL (ref 1.7–2.4)

## 2021-08-28 LAB — TRIGLYCERIDES: Triglycerides: 64 mg/dL (ref <150)

## 2021-08-28 MED ORDER — SODIUM CHLORIDE 0.9% FLUSH
10.0000 mL | INTRAVENOUS | Status: DC | PRN
  Administered 2021-08-29 – 2021-09-20 (×7): 10 mL
  Administered 2021-09-20: 20 mL

## 2021-08-28 MED ORDER — LACTATED RINGERS IV SOLN: INTRAVENOUS | Status: AC

## 2021-08-28 MED ORDER — MAGNESIUM SULFATE 2 GM/50ML IV SOLN
2.0000 g | Freq: Once | INTRAVENOUS | Status: AC
  Administered 2021-08-28: 2 g via INTRAVENOUS
  Filled 2021-08-28: qty 50

## 2021-08-28 MED ORDER — TRAVASOL 10 % IV SOLN
INTRAVENOUS | Status: AC
  Filled 2021-08-28: qty 480

## 2021-08-28 MED ORDER — CHLORHEXIDINE GLUCONATE CLOTH 2 % EX PADS
6.0000 | MEDICATED_PAD | Freq: Every day | CUTANEOUS | Status: DC
  Administered 2021-08-28 – 2021-09-22 (×25): 6 via TOPICAL

## 2021-08-28 MED ORDER — INSULIN ASPART 100 UNIT/ML IJ SOLN
0.0000 [IU] | Freq: Three times a day (TID) | INTRAMUSCULAR | Status: DC
  Administered 2021-08-29 (×2): 3 [IU] via SUBCUTANEOUS
  Administered 2021-08-30 – 2021-09-02 (×9): 2 [IU] via SUBCUTANEOUS

## 2021-08-28 MED ORDER — SODIUM PHOSPHATES 45 MMOLE/15ML IV SOLN
30.0000 mmol | Freq: Once | INTRAVENOUS | Status: AC
  Administered 2021-08-28: 30 mmol via INTRAVENOUS
  Filled 2021-08-28: qty 10

## 2021-08-28 NOTE — Progress Notes (Signed)
[facility] Progress Note  Hunter Ward 70 y.o. 08/22/1951  CC: Necrotizing pancreatitis   Subjective: Patient seen and examined at bedside.  Interpreter software used for sign language.   No acute issues overnight.  Patient continues to have AB pain and nausea, no vomiting.  He has not had BM in 2 weeks per patient but does have urge, he is passing gas.  Denies fever, chills.  Unable to advance feeding tube placement because of duodenal obstruction from pancreatic abscess/extrinsic compression.    ROS : Negative for chest pain and shortness of breath   Objective: Vital signs in last 24 hours: Vitals:   08/27/21 2107 08/28/21 0520  BP: 95/60 102/65  Pulse: 69 69  Resp: 18 18  Temp: 98.2 F (36.8 C) 98.3 F (36.8 C)  SpO2: 91% 96%    Physical Exam:  General -resting comfortably.  Not in acute distress.  NG tube in place Abdomen : Soft, slightly distended, tender to palpation, bowel sounds sluggish. No peritoneal signs Neuro : Alert and oriented x3 Psych : Mood and affect normal  Lab Results: Recent Labs    08/27/21 0511 08/28/21 0425  NA 134* 137  K 3.9 4.5  CL 101 102  CO2 27 27  GLUCOSE 86 78  BUN 15 15  CREATININE 0.63 0.62  CALCIUM 7.8* 7.9*  MG 1.9 1.8  PHOS  --  2.5   Recent Labs    08/27/21 0511 08/28/21 0425  AST 15 15  ALT 16 14  ALKPHOS 50 52  BILITOT 0.9 0.9  PROT 5.1* 5.3*  ALBUMIN 2.1* 2.1*   Recent Labs    08/27/21 0511 08/28/21 0425  WBC 12.2* 11.9*  NEUTROABS 10.4* 10.0*  HGB 10.3* 10.4*  HCT 31.3* 31.8*  MCV 98.1 98.5  PLT 179 177   No results for input(s): LABPROT, INR in the last 72 hours.    Assessment/Plan: -Necrotizing pancreatitis with suspected 8 cm abscess around the pancreatic head. -Pancreatic pseudocyst - Constipation -Leukocytosis stable  -Continue IV hydration and IV antibiotics - Unable to advance feeding tube due to mass-effect in second portion of duodenum. - No contrast passed into  distal aspect of the second portion of the duodenum. - Will consider doing upper GI series in a few days hopefully once pancreatitis, pancreatic abscess and pancreatic edema improves which will allow passage of contrast into the second portion of duodenum. - May repeat imaging if worsening leukocytosis or pain. -Patient on TPN at this time. -Patient had constipation outpatient, start on suppositories  Vladimir Crofts PA-C 08/28/2021, 11:46 AM  Contact #  [phone]

## 2021-08-28 NOTE — Progress Notes (Signed)
Nutrition Follow-up  INTERVENTION:   -TPN management per Pharmacy  -TF recommendations: -Provide Osmolite 1.5 @ 20 ml/hr, advance by 10 ml every 4 hours to goal rate of 55 ml/hr. -45 ml Prosource TF daily -Provides 1980 kcals, 93g protein and 1005 ml H2O -Free water of 200 ml every 6 hours (800 ml)  NUTRITION DIAGNOSIS:   Increased nutrient needs related to acute illness as evidenced by estimated needs.  Ongoing.  GOAL:   Patient will meet greater than or equal to 90% of their needs  Not meeting currently.  MONITOR:   Labs, Diet advancement, Weight trends, I & O's  REASON FOR ASSESSMENT:   Consult New TPN/TNA  ASSESSMENT:   70 year old male with PMH pancreatitis, HTN, hx CCY (~30 yrs ago), deafness (uses ASL) who presented to the hospital with worsening abdomial pain and inability to keep any food down the past 3 days. His last meal kept down was on 08/22/21.  Patient having abdominal pain and nausea. Per chart review, pt was unable to have feeding tube advanced d/t duodenal obstruction. NGT placed 9/16. Will leave TF recommendations above in case tube can be advanced at a later time. Will start TPN today pending PICC placement.  TPN to begin today at 40 ml/hr, providing 969 kcals, 48g protein.  Admission weight: 162 lbs. Current weight: 170 lbs.  I/Os:  +8L since admit UOP: 425 ml x 24 hrs  Medications: Lactated ringers, IV Mg sulfate, sodium phosphate, IV Zofran  Labs reviewed.  Diet Order:   Diet Order             Diet NPO time specified  Diet effective now                   EDUCATION NEEDS:   No education needs have been identified at this time  Skin:  Skin Assessment: Reviewed RN Assessment  Last BM:  PTA  Height:   Ht Readings from Last 1 Encounters:  08/25/21 '5\' 10"'$  (1.778 m)    Weight:   Wt Readings from Last 1 Encounters:  08/28/21 77.2 kg    BMI:  Body mass index is 24.42 kg/m.  Estimated Nutritional Needs:   Kcal:   1800-2000  Protein:  85-95g  Fluid:  2L/day  Clayton Bibles, MS, RD, LDN Inpatient Clinical Dietitian Contact information available via Amion

## 2021-08-28 NOTE — Progress Notes (Addendum)
Progress Note    Hunter Ward   UXN:[id number]  DOB: Jul 30, 1951  DOA: 08/24/2021     3  PCP: Gaynelle Arabian, MD  Initial CC: abdominal pain, N/V  Hospital Course: Hunter Ward is a 70 year old male with PMH pancreatitis, HTN, hx CCY (~30 yrs ago), deafness (uses ASL) who presented to the hospital with worsening abdomial pain and inability to keep any food down the past 3 days. His last meal kept down was on 08/22/21.  He was recently hospitalized from 08/05/21 to 08/12/21 due to gallstone pancreatitis.  Imaging at that time showed a sizeable gallbladder remnant in the GB fossa with evidence of severe acute pancreatitis due to small retained gallstones within the fossa; MRCP also showed choledocholithiasis with 2 stones within the CBD. He then underwent ERCP on 08/07/2021 removing 2 stones, sphincterotomy performed, and placement of a plastic stent in the ventral pancreatic duct. Plan at discharge was for patient to follow-up at [facility] with advanced GI and surgery for definitive stone removal from gallbladder remnant.  He was discharged on 08/12/2021 and due to minimal recovery at home with recurrence and ongoing abdominal pain with inability to tolerate nutrition, he has presented back for further evaluation.  On admission this hospitalization, he underwent repeat CT abdomen/pelvis.  This showed progressive necrotizing pancreatitis with concern for phlegmon or abscess development measuring 8 x 5.2 cm.  There was also suspected splenic vein thrombosis on CT for which he was started on heparin drip for.  Persistent gallstones were noted in the gallbladder remnant and stable common hepatic duct dilation. Liver Doppler was then performed which showed patent portal and splenic veins, therefore heparin drip was discontinued at this time.  Interval History:  No events overnight.  ALS interpreter used as usual. Explained to him plan for PICC line and TPN. He still complains of throat pain from his tube  in place.  Minimal nausea, no vomiting. Couldn't tell if he's truly having flatus.   ROS: Constitutional: negative for chills and fevers, Respiratory: negative for cough and sputum, Cardiovascular: negative for chest pain, and Gastrointestinal: positive for abdominal pain  Assessment & Plan: * Necrotizing pancreatitis - per CT A/P on 9/15: "Progressive necrotizing pancreatitis as above, with 8.0 x 5.2 cm suspected abscess replacing the pancreatic head. Multilocular fluid collections along the pancreatic body may reflect further necrotic changes versus organizing pseudocysts. There is only minimal normal enhancing pancreatic tissue identified." - unable to be drained/approachable via IR; recommended conservative management at this time; possibly can re-image in a few days if no significant improvement or any worsening - continue zosyn for now - GI also following, appreciate assistance  - continue NPO, IVF, pain/nausea control  - splenic vein thrombosis ruled out, d/c'd heparin drip  - NGT in place but currently not to be used; not fully passed pylorus plus significant duodenal stricture/compression from pancreatic abscess and patient would not tolerate TF at this time - patient now ~6 days since adequate nutrition; discussed with GI as well; will place PICC and start TPN for now (d/c LR once TPN started) - continue bowel rest and upper GI series later this coming week - at risk for ileus; bowel sounds becoming more hypoactive  HTN (hypertension) - BP currently low/normal - Hold off on any antihypertensive agents at this time - continue LR  Deafness - ASL interpreter used for communication   Mild protein malnutrition ([place]) - Patient's BMI is Body mass index is 22.74 kg/m.. - Patient has the following signs/symptoms  consistent with PCM: (fat loss, muscle loss, muscle wasting). - check pre-albumin (<5 mg/dL) - starting TPN after PICC placed   Hyponatremia - Sodium 132 on admission -  Presumed hypovolemic hyponatremia from GI losses due to nausea/vomiting and poor oral intake - continue LR until TPN started then d/c LR  Normocytic anemia - baseline Hgb was ~14 g/dL in August however has now downtrended to ~10 g/dL; no overt bleeding reported; likely due to acute and prolonged illness - continue trending Hgb    Old records reviewed in assessment of this patient  Antimicrobials: Zosyn 08/24/2021 >> current  DVT prophylaxis: Lovenox enoxaparin (LOVENOX) injection 40 mg Start: 08/26/21 1000   Code Status:   Code Status: Full Code Family Communication: family bedside 9/16  Disposition Plan: Status is: Inpatient  Remains inpatient appropriate because:Ongoing active pain requiring inpatient pain management, IV treatments appropriate due to intensity of illness or inability to take PO, and Inpatient level of care appropriate due to severity of illness  Dispo: The patient is from: Home              Anticipated d/c is to: Home              Patient currently is not medically stable to d/c.   Difficult to place patient No Risk of unplanned readmission score: Unplanned Admission- Pilot do not use: 20.11   Objective: Blood pressure 107/69, pulse 70, temperature 98.3 F (36.8 C), temperature source Oral, resp. rate 15, height 5\' 10"  (1.778 m), weight 77.2 kg, SpO2 95 %.  Examination: General appearance: alert, cooperative, and no distress Head: Normocephalic, without obvious abnormality, atraumatic Eyes:  EOMI Lungs: clear to auscultation bilaterally Heart: regular rate and rhythm and S1, S2 normal Abdomen:  soft, ND, TTP in upper quadrants, improved from prior exam, no guarding; BS present but becoming more hypoactive  Extremities:  no edema Skin: mobility and turgor normal Neurologic: Grossly normal  Consultants:  GI  Procedures:    Data Reviewed: I have personally reviewed following labs and imaging studies Results for orders placed or performed during the  hospital encounter of 08/24/21 (from the past 24 hour(s))  Magnesium     Status: None   Collection Time: 08/28/21  4:25 AM  Result Value Ref Range   Magnesium 1.8 1.7 - 2.4 mg/dL  Comprehensive metabolic panel     Status: Abnormal   Collection Time: 08/28/21  4:25 AM  Result Value Ref Range   Sodium 137 135 - 145 mmol/L   Potassium 4.5 3.5 - 5.1 mmol/L   Chloride 102 98 - 111 mmol/L   CO2 27 22 - 32 mmol/L   Glucose, Bld 78 70 - 99 mg/dL   BUN 15 8 - 23 mg/dL   Creatinine, Ser 0.62 0.61 - 1.24 mg/dL   Calcium 7.9 (L) 8.9 - 10.3 mg/dL   Total Protein 5.3 (L) 6.5 - 8.1 g/dL   Albumin 2.1 (L) 3.5 - 5.0 g/dL   AST 15 15 - 41 U/L   ALT 14 0 - 44 U/L   Alkaline Phosphatase 52 38 - 126 U/L   Total Bilirubin 0.9 0.3 - 1.2 mg/dL   GFR, Estimated >60 >60 mL/min   Anion gap 8 5 - 15  CBC with Differential/Platelet     Status: Abnormal   Collection Time: 08/28/21  4:25 AM  Result Value Ref Range   WBC 11.9 (H) 4.0 - 10.5 K/uL   RBC 3.23 (L) 4.22 - 5.81 MIL/uL   Hemoglobin 10.4 (  L) 13.0 - 17.0 g/dL   HCT 31.8 (L) 39.0 - 52.0 %   MCV 98.5 80.0 - 100.0 fL   MCH 32.2 26.0 - 34.0 pg   MCHC 32.7 30.0 - 36.0 g/dL   RDW 13.1 11.5 - 15.5 %   Platelets 177 150 - 400 K/uL   nRBC 0.0 0.0 - 0.2 %   Neutrophils Relative % 84 %   Neutro Abs 10.0 (H) 1.7 - 7.7 K/uL   Lymphocytes Relative 10 %   Lymphs Abs 1.2 0.7 - 4.0 K/uL   Monocytes Relative 5 %   Monocytes Absolute 0.6 0.1 - 1.0 K/uL   Eosinophils Relative 0 %   Eosinophils Absolute 0.0 0.0 - 0.5 K/uL   Basophils Relative 0 %   Basophils Absolute 0.0 0.0 - 0.1 K/uL   Immature Granulocytes 1 %   Abs Immature Granulocytes 0.10 (H) 0.00 - 0.07 K/uL  Phosphorus     Status: None   Collection Time: 08/28/21  4:25 AM  Result Value Ref Range   Phosphorus 2.5 2.5 - 4.6 mg/dL  Triglycerides     Status: None   Collection Time: 08/28/21  4:25 AM  Result Value Ref Range   Triglycerides 64 <150 mg/dL    Recent Results (from the past 240 hour(s))   Resp Panel by RT-PCR (Flu A&B, Covid) Nasopharyngeal Swab     Status: None   Collection Time: 08/24/21  1:37 PM   Specimen: Nasopharyngeal Swab; Nasopharyngeal(NP) swabs in vial transport medium  Result Value Ref Range Status   SARS Coronavirus 2 by RT PCR NEGATIVE NEGATIVE Final    Comment: (NOTE) SARS-CoV-2 target nucleic acids are NOT DETECTED.  The SARS-CoV-2 RNA is generally detectable in upper respiratory specimens during the acute phase of infection. The lowest concentration of SARS-CoV-2 viral copies this assay can detect is 138 copies/mL. A negative result does not preclude SARS-Cov-2 infection and should not be used as the sole basis for treatment or other patient management decisions. A negative result may occur with  improper specimen collection/handling, submission of specimen other than nasopharyngeal swab, presence of viral mutation(s) within the areas targeted by this assay, and inadequate number of viral copies(<138 copies/mL). A negative result must be combined with clinical observations, patient history, and epidemiological information. The expected result is Negative.  Fact Sheet for Patients:  EntrepreneurPulse.com.au  Fact Sheet for Healthcare Providers:  IncredibleEmployment.be  This test is no t yet approved or cleared by the [place] FDA and  has been authorized for detection and/or diagnosis of SARS-CoV-2 by FDA under an Emergency Use Authorization (EUA). This EUA will remain  in effect (meaning this test can be used) for the duration of the COVID-19 declaration under Section 564(b)(1) of the Act, 21 U.S.C.section 360bbb-3(b)(1), unless the authorization is terminated  or revoked sooner.       Influenza A by PCR NEGATIVE NEGATIVE Final   Influenza B by PCR NEGATIVE NEGATIVE Final    Comment: (NOTE) The Xpert Xpress SARS-CoV-2/FLU/RSV plus assay is intended as an aid in the diagnosis of influenza from  Nasopharyngeal swab specimens and should not be used as a sole basis for treatment. Nasal washings and aspirates are unacceptable for Xpert Xpress SARS-CoV-2/FLU/RSV testing.  Fact Sheet for Patients: EntrepreneurPulse.com.au  Fact Sheet for Healthcare Providers: IncredibleEmployment.be  This test is not yet approved or cleared by the [place] FDA and has been authorized for detection and/or diagnosis of SARS-CoV-2 by FDA under an Emergency Use Authorization (EUA). This EUA  will remain in effect (meaning this test can be used) for the duration of the COVID-19 declaration under Section 564(b)(1) of the Act, 21 U.S.C. section 360bbb-3(b)(1), unless the authorization is terminated or revoked.  Performed at [facility], [place] [address] [place], [place] [address]      Radiology Studies: DG Fluoro Rm 1-60 Min  Result Date: 08/26/2021 CLINICAL DATA:  Necrotizing pancreatitis. Advancement of feeding tube. EXAM: DG C-ARM 1-60 MIN CONTRAST:  20 mL dilute Omnipaque FLUOROSCOPY TIME:  Fluoroscopy Time:  5 minutes 30 seconds Radiation Exposure Index (if provided by the fluoroscopic device): 66.3 Number of Acquired Spot Images: 1 COMPARISON:  CT 08/24/2021 FINDINGS: Patient presented to radiology department with feeding tube in the distal aspect the stomach. Multiple attempts were made to advance the feeding tube into the second portion duodenum. The stylet was replaced with a stiff guidewire which also failed to advance the tip of the tube into the second portion duodenum. Water-soluble contrast was injected at the first portion the duodenum. Majority of contrast flowed retrograde into the stomach. Only a thin trickle of contrast enters the proximal second portion duodenum. No contrast enters the distal second portion the duodenum. The feeding tube was left  in the distal stomach with stylet place. IMPRESSION: 1. Failure to pass the NG tube  into the distal duodenum. 2. Favor edema or mass effect within the second portion the duodenum related to the pancreatic abscess. This is correlates with findings on CT 08/24/2021. Edema/mass effect prevents passage of the feeding tube. 3. Only a thin trickle of contrast injected passed into the proximal second portion duodenum indicating luminal narrowing either by edema or mass effect. No contrast passed into the distal aspect of the second portion of the duodenum. Findings conveyed toDAVID Macaulay Reicher on 08/26/2021  at14:46. Electronically Signed   By: Suzy Bouchard M.D.   On: 08/26/2021 14:50   [place] EKG SITE RITE  Result Date: 08/27/2021 If Site Rite image not attached, placement could not be confirmed due to current cardiac rhythm.  [place] EKG SITE RITE  Final Result    DG Fluoro Rm 1-60 Min  Final Result    DG Abd 1 View  Final Result    US LIVER DOPPLER LIMITED (PV OR SINGLE ART/VEIN)  Final Result    CT ABDOMEN PELVIS W CONTRAST  Final Result      Scheduled Meds:  enoxaparin (LOVENOX) injection  40 mg Subcutaneous Daily   feeding supplement  237 mL Oral BID BM   insulin aspart  0-15 Units Subcutaneous Q8H   pantoprazole (PROTONIX) IV  40 mg Intravenous Q24H   PRN Meds: acetaminophen **OR** acetaminophen, HYDROmorphone (DILAUDID) injection, lidocaine, ondansetron **OR** ondansetron (ZOFRAN) IV, phenol, prochlorperazine Continuous Infusions:  lactated ringers 100 mL/hr at 08/28/21 1255   lactated ringers     piperacillin-tazobactam (ZOSYN)  IV 3.375 g (08/28/21 0453)   sodium phosphate  Dextrose 5% IVPB 30 mmol (08/28/21 0924)   TPN ADULT (ION)       LOS: 3 days  Time spent: Greater than 50% of the 35 minute visit was spent in counseling/coordination of care for the patient as laid out in the A&P.   Dwyane Dee, MD [facility] 08/28/2021, 1:42 PM

## 2021-08-28 NOTE — Progress Notes (Signed)
PHARMACY - TOTAL PARENTERAL NUTRITION CONSULT NOTE   Indication:  NPO for necrotizing pancreatitis  Patient Measurements: Height: 5\' 10"  (177.8 cm) Weight: 77.2 kg (170 lb 3.1 oz) IBW/kg (Calculated) : 73 TPN AdjBW (KG): 71.9 Body mass index is 24.42 kg/m. Usual Weight: 80 kg  Assessment: 54 yoM re-admitted for necrotizing pancreatitis and is NPO.  Initially planning for post-pyloric tube feeds, but unable to advance NGT d/t significant duodenal stricture from pancreatic abscess and surrounding edema. Pharmacy consulted to dose TPN.  Glucose / Insulin: No hx DM or medications; Glucose < 100 on BMP Electrolytes: Phos borderline low, others WNL Renal: SCr, BUN, bicarb stable, WNL; UOP not fully charted Hepatic: hepatic enzymes, tbili stable WNL; albumin remains low - TG WNL (9/19) I/O: NG clamped - MIVF: LR @ 100 GI Imaging: - 9/15 CTa/p: progressive necrotizing pancreatitis w/ 8 cm suspected abscess at pancreatic head, multiple other loculated collections along pancreatic body; common hepatic duct dilation w/ persistent gallstones in GB. - 9/17 UGI: minimal passage of contrast past duodenal stricture, likely d/t mass effect from pancreatic abscess/edema GI Surgeries / Procedures:  - 8/29: Pancreatic stent placed - 9/16: NG tube placed  Central access: PICC line ordered TPN start date: 9/19  Nutritional Goals:   RD Assessment (08/25/21):  Estimated Needs Total Energy Estimated Needs: 1800-2000 Total Protein Estimated Needs: 85-95g Total Fluid Estimated Needs: 2L/day  Current TPN formulation at 80 mL/hr provides 96 g of protein and 1939 kcals per day (Travasol 50 g/L; D70 15%, SMOFlipid 30 g/L)  Current Nutrition:  NPO   Plan:  Mg 2g IV x 1 per MD NaPhos 30 mmol IV x 1 per MD  Start TPN at 40 mL/hr at 1800 Electrolytes in TPN:  Na - 50 mEq/L K - 50 mEq/L Ca - 5 mEq/L Mg - 5 mEq/L Phos - 15 mmol/L Cl:Ac ratio 1:1 Add standard MVI and trace elements to TPN Initiate  Moderate q8h SSI and adjust as needed  Reduce MIVF to 50 mL/hr at 1800 Monitor TPN labs on Mon/Thurs BMP, Mg, Phos tomorrow AM  Reuel Boom, PharmD, BCPS [phone] 08/28/2021, 8:08 AM

## 2021-08-28 NOTE — Progress Notes (Signed)
Peripherally Inserted Central Catheter Placement  The IV Nurse has discussed with the patient and/or persons authorized to consent for the patient, the purpose of this procedure and the potential benefits and risks involved with this procedure.  The benefits include less needle sticks, lab draws from the catheter, and the patient may be discharged home with the catheter. Risks include, but not limited to, infection, bleeding, blood clot (thrombus formation), and puncture of an artery; nerve damage and irregular heartbeat and possibility to perform a PICC exchange if needed/ordered by physician.  Alternatives to this procedure were also discussed.  Bard Power PICC patient education guide, fact sheet on infection prevention and patient information card has been provided to patient /or left at bedside.    PICC Placement Documentation  PICC Triple Lumen [id number] PICC Right Basilic 42 cm 0 cm (Active)  Indication for Insertion or Continuance of Line Administration of hyperosmolar/irritating solutions (i.e. TPN, Vancomycin, etc.) 08/28/21 1507  Exposed Catheter (cm) 0 cm 08/28/21 1507  Site Assessment Clean;Dry;Intact 08/28/21 1507  Lumen #1 Status Flushed;Saline locked;Blood return noted 08/28/21 1507  Lumen #2 Status Flushed;Saline locked;Blood return noted 08/28/21 1507  Lumen #3 Status Flushed;Saline locked;Blood return noted 08/28/21 1507  Dressing Type Transparent 08/28/21 1507  Dressing Status Clean;Dry;Intact 08/28/21 1507  Antimicrobial disc in place? Yes 08/28/21 1507  Dressing Intervention New dressing;Other (Comment) 08/28/21 1507  Dressing Change Due 09/04/21 08/28/21 1507    Consent signed by patient with daughter and wife at bedside help explain the risk and benefits.   Christella Noa Albarece 08/28/2021, 3:09 PM

## 2021-08-28 NOTE — Care Management Important Message (Signed)
Important Message  Patient Details IM Letter placed in Patient's room. Name: Hunter Ward MRN: [id number] Date of Birth: 1951/02/04   Medicare Important Message Given:  Yes     Kerin Salen 08/28/2021, 12:57 PM

## 2021-08-28 NOTE — Plan of Care (Signed)

## 2021-08-29 DIAGNOSIS — K8591 Acute pancreatitis with uninfected necrosis, unspecified: Secondary | ICD-10-CM | POA: Diagnosis not present

## 2021-08-29 LAB — COMPREHENSIVE METABOLIC PANEL
ALT: 13 U/L (ref 0–44)
AST: 17 U/L (ref 15–41)
Albumin: 2.1 g/dL — ABNORMAL LOW (ref 3.5–5.0)
Alkaline Phosphatase: 45 U/L (ref 38–126)
Anion gap: 6 (ref 5–15)
BUN: 12 mg/dL (ref 8–23)
CO2: 28 mmol/L (ref 22–32)
Calcium: 8 mg/dL — ABNORMAL LOW (ref 8.9–10.3)
Chloride: 106 mmol/L (ref 98–111)
Creatinine, Ser: 0.53 mg/dL — ABNORMAL LOW (ref 0.61–1.24)
GFR, Estimated: >60 mL/min (ref >60)
Glucose, Bld: 149 mg/dL — ABNORMAL HIGH (ref 70–99)
Potassium: 3.6 mmol/L (ref 3.5–5.1)
Sodium: 140 mmol/L (ref 135–145)
Total Bilirubin: 0.8 mg/dL (ref 0.3–1.2)
Total Protein: 5.2 g/dL — ABNORMAL LOW (ref 6.5–8.1)

## 2021-08-29 LAB — CBC WITH DIFFERENTIAL/PLATELET
Abs Immature Granulocytes: 0.09 10*3/uL — ABNORMAL HIGH (ref 0.00–0.07)
Basophils Absolute: 0 10*3/uL (ref 0.0–0.1)
Basophils Relative: 0 %
Eosinophils Absolute: 0 10*3/uL (ref 0.0–0.5)
Eosinophils Relative: 0 %
HCT: 30.8 % — ABNORMAL LOW (ref 39.0–52.0)
Hemoglobin: 10.2 g/dL — ABNORMAL LOW (ref 13.0–17.0)
Immature Granulocytes: 1 %
Lymphocytes Relative: 11 %
Lymphs Abs: 1 10*3/uL (ref 0.7–4.0)
MCH: 32.5 pg (ref 26.0–34.0)
MCHC: 33.1 g/dL (ref 30.0–36.0)
MCV: 98.1 fL (ref 80.0–100.0)
Monocytes Absolute: 0.6 10*3/uL (ref 0.1–1.0)
Monocytes Relative: 7 %
Neutro Abs: 7.4 10*3/uL (ref 1.7–7.7)
Neutrophils Relative %: 81 %
Platelets: 179 10*3/uL (ref 150–400)
RBC: 3.14 MIL/uL — ABNORMAL LOW (ref 4.22–5.81)
RDW: 13.2 % (ref 11.5–15.5)
WBC: 9.2 10*3/uL (ref 4.0–10.5)
nRBC: 0 % (ref 0.0–0.2)

## 2021-08-29 LAB — PHOSPHORUS: Phosphorus: 2.3 mg/dL — ABNORMAL LOW (ref 2.5–4.6)

## 2021-08-29 LAB — GLUCOSE, CAPILLARY
Glucose-Capillary: 175 mg/dL — ABNORMAL HIGH (ref 70–99)
Glucose-Capillary: 137 mg/dL — ABNORMAL HIGH (ref 70–99)
Glucose-Capillary: 155 mg/dL — ABNORMAL HIGH (ref 70–99)

## 2021-08-29 LAB — MAGNESIUM: Magnesium: 2 mg/dL (ref 1.7–2.4)

## 2021-08-29 MED ORDER — POTASSIUM PHOSPHATES 15 MMOLE/5ML IV SOLN
30.0000 mmol | Freq: Once | INTRAVENOUS | Status: AC
  Administered 2021-08-29: 30 mmol via INTRAVENOUS
  Filled 2021-08-29: qty 10

## 2021-08-29 MED ORDER — TRAVASOL 10 % IV SOLN
INTRAVENOUS | Status: AC
  Filled 2021-08-29: qty 720

## 2021-08-29 NOTE — Progress Notes (Addendum)
PHARMACY - TOTAL PARENTERAL NUTRITION CONSULT NOTE   Indication:  NPO for necrotizing pancreatitis  Patient Measurements: Height: 5\' 10"  (177.8 cm) Weight: 77.2 kg (170 lb 3.1 oz) IBW/kg (Calculated) : 73 TPN AdjBW (KG): 71.9 Body mass index is 24.42 kg/m. Usual Weight: 80 kg  Assessment: 67 yoM re-admitted for necrotizing pancreatitis and is NPO.  Initially planning for post-pyloric tube feeds, but unable to advance NGT d/t significant duodenal stricture from pancreatic abscess and surrounding edema. Pharmacy consulted to dose TPN.  Glucose / Insulin: No hx DM or medications - refusing CBG checks; SBG WNL this AM Electrolytes: K & Phos borderline low; others stable WNL Renal: SCr, BUN, bicarb stable, WNL; UOP low or incompletely charted Hepatic: hepatic enzymes, tbili stable WNL; albumin remains low - TG WNL (9/19) I/O: NG clamped - MIVF: LR @ 50 GI Imaging: - 9/15 CTa/p: progressive necrotizing pancreatitis w/ 8 cm suspected abscess at pancreatic head, multiple other loculated collections along pancreatic body; common hepatic duct dilation w/ persistent gallstones in GB. - 9/17 UGI: minimal passage of contrast past duodenal stricture, likely d/t mass effect from pancreatic abscess/edema GI Surgeries / Procedures:  - 8/29: Pancreatic stent placed - 9/16: NG tube placed  Central access: PICC line ordered TPN start date: 9/19  Nutritional Goals:   RD Assessment (08/28/21):  Estimated Needs Total Energy Estimated Needs: 1800-2000 Total Protein Estimated Needs: 85-95g Total Fluid Estimated Needs: 2L/day  Current TPN formulation at 80 mL/hr provides 96 g of protein and 1939 kcals per day (Travasol 50 g/L; D70 15%, SMOFlipid 30 g/L)  Current Nutrition:  NPO, TPN at 40 ml/hr   Plan:  KPhos 30 mmol IV x 1 per MD (provides 45 mEq K)  Increase TPN rate to 60 mL/hr at 1800 - advancing slowly given possible signs of refeeding Electrolytes in TPN: no individual changes given  increasing TPN rate Na - 50 mEq/L K - 50 mEq/L Ca - 5 mEq/L Mg - 5 mEq/L Phos - 15 mmol/L Cl:Ac ratio 1:1 Add standard MVI and trace elements to TPN Continue Moderate q8h SSI and adjust as needed  Stop MIVF; further orders per MD Monitor TPN labs on Mon/Thurs BMP, Mg, Phos tomorrow AM  Reuel Boom, PharmD, BCPS [phone] 08/29/2021, 9:23 AM

## 2021-08-29 NOTE — Progress Notes (Signed)
Over the course of the night pt refused to have FSBS. 2 Rns and nursing assistant educated pt on importance of having FSBS done as he is on TPN. Pt still refused.  On call APP notified. No new orders placed. Glucose level from routine blood work noted.

## 2021-08-29 NOTE — Progress Notes (Addendum)
PROGRESS NOTE   Hunter Ward  WGN:[id number]    DOB: Jul 16, 1951    DOA: 08/24/2021  PCP: Hunter Arabian, MD   I have briefly reviewed patients previous medical records in [facility].  Chief Complaint  Patient presents with   Fever   Emesis   Abdominal Pain    Brief Narrative:   Hunter Ward is a 69 year old male with PMH pancreatitis, HTN, hx CCY (~30 yrs ago), deafness (uses ASL) who presented to the hospital with worsening abdomial pain and inability to keep any food down the past 3 days. His last meal kept down was on 08/22/21.   He was recently hospitalized from 08/05/21 to 08/12/21 due to gallstone pancreatitis.  Imaging at that time showed a sizeable gallbladder remnant in the GB fossa with evidence of severe acute pancreatitis due to small retained gallstones within the fossa; MRCP also showed choledocholithiasis with 2 stones within the CBD. He then underwent ERCP on 08/07/2021 removing 2 stones, sphincterotomy performed, and placement of a plastic stent in the ventral pancreatic duct. Plan at discharge was for patient to follow-up at [facility] with advanced GI and surgery for definitive stone removal from gallbladder remnant.   He was discharged on 08/12/2021 and due to minimal recovery at home with recurrence and ongoing abdominal pain with inability to tolerate nutrition, he has presented back for further evaluation.   On admission this hospitalization, he underwent repeat CT abdomen/pelvis.  This showed progressive necrotizing pancreatitis with concern for phlegmon or abscess development measuring 8 x 5.2 cm.  There was also suspected splenic vein thrombosis on CT for which he was started on heparin drip for.  Persistent gallstones were noted in the gallbladder remnant and stable common hepatic duct dilation. Liver Doppler was then performed which showed patent portal and splenic veins, therefore heparin drip was discontinued at this time.     Assessment & Plan:  Principal  Problem:   Necrotizing pancreatitis Active Problems:   HTN (hypertension)   Normocytic anemia   Hyponatremia   Mild protein malnutrition (HCC)   Deafness   Necrotizing pancreatitis - per CT A/P on 9/15: "Progressive necrotizing pancreatitis as above, with 8.0 x 5.2 cm suspected abscess replacing the pancreatic head. Multilocular fluid collections along the pancreatic body may reflect further necrotic changes versus organizing pseudocysts. There is only minimal normal enhancing pancreatic tissue identified." - unable to be drained/approachable via IR; recommended conservative management at this time; possibly can re-image in a few days if no significant improvement or any worsening - continue zosyn for now - GI also following, appreciate assistance  - continue NPO, IVF, pain/nausea control and now on TNA via PICC line. - splenic vein thrombosis ruled out, d/c'd heparin drip  - NGT in place but currently not to be used; not fully passed pylorus plus significant duodenal stricture/compression from pancreatic abscess and patient would not tolerate TF at this time - continue bowel rest and upper GI series later this coming week - at risk for ileus;  -As per GI follow-up yesterday, necrotizing pancreatitis with pseudocyst and enteral tube that cannot be advanced into the duodenum due to pancreatitis.   HTN (hypertension) - BP currently low/normal - Hold off on any antihypertensive agents at this time   Deafness -Communicated by writing on patient's notebook.   Mild protein malnutrition (HCC) - Patient's BMI is Body mass index is 22.74 kg/m.. - Patient has the following signs/symptoms consistent with PCM: (fat loss, muscle loss, muscle wasting). - check pre-albumin (<  5 mg/dL) -Started TPN after PICC placed    Hyponatremia - Sodium 132 on admission - Presumed hypovolemic hyponatremia from GI losses due to nausea/vomiting and poor oral intake -Resolved.  Hypophosphatemia - Being  replaced and monitored by pharmacy   Normocytic anemia - baseline Hgb was ~14 g/dL in August however has now downtrended to ~10 g/dL; no overt bleeding reported; likely due to acute and prolonged illness -Hemoglobin stable in the 10 g range.    Body mass index is 24.42 kg/m.  Nutritional Status Nutrition Problem: Increased nutrient needs Etiology: acute illness Signs/Symptoms: estimated needs Interventions: Ensure Surgery, MVI  Pressure Ulcer:     DVT prophylaxis: enoxaparin (LOVENOX) injection 40 mg Start: 08/26/21 1000     Code Status: Full Code Family Communication: None at bedside. Disposition:  Status is: Inpatient  Remains inpatient appropriate because:Inpatient level of care appropriate due to severity of illness  Dispo: The patient is from: Home              Anticipated d/c is to: Home              Patient currently is not medically stable to d/c.   Difficult to place patient No        Consultants:   [facility]  Procedures:   Core track Right upper extremity PICC line with TNA  Antimicrobials:    Anti-infectives (From admission, onward)    Start     Dose/Rate Route Frequency Ordered Stop   08/25/21 0200  piperacillin-tazobactam (ZOSYN) IVPB 3.375 g        3.375 g 12.5 mL/hr over 240 Minutes Intravenous Every 8 hours 08/24/21 2009     08/24/21 2200  piperacillin-tazobactam (ZOSYN) IVPB 3.375 g  Status:  Discontinued        3.375 g 100 mL/hr over 30 Minutes Intravenous Every 8 hours 08/24/21 1931 08/24/21 2009   08/24/21 1915  piperacillin-tazobactam (ZOSYN) IVPB 3.375 g        3.375 g 100 mL/hr over 30 Minutes Intravenous  Once 08/24/21 1911 08/24/21 2030         Subjective:  Patient reports mild epigastric area pain and bilateral back area soreness.  No BM.  As per RN, no acute issues reported.  Objective:   Vitals:   08/28/21 1327 08/28/21 2035 08/29/21 0546 08/29/21 1200  BP: 107/69 113/65 111/61 107/67  Pulse: 70 66 65 60  Resp: 15 18  16 16   Temp:  98.2 F (36.8 C) 98.1 F (36.7 C) 98.3 F (36.8 C)  TempSrc:    Oral  SpO2: 95% 95% 94% 96%  Weight:      Height:        General exam: Elderly male, moderately built and nourished, initially laying in bed and subsequently seen on reclining chair.  Not in any distress. Respiratory system: Clear to auscultation. Respiratory effort normal. Cardiovascular system: S1 & S2 heard, RRR. No JVD, murmurs, rubs, gallops or clicks. No pedal edema.  Telemetry personally reviewed: SR-SB in the 75s. Gastrointestinal system: Abdomen is nondistended, soft and mild fluctuating/inconsistent tenderness.  No organomegaly or masses felt. Normal bowel sounds heard. Central nervous system: Alert and oriented. No focal neurological deficits. Extremities: Symmetric 5 x 5 power. Skin: No rashes, lesions or ulcers Psychiatry: Judgement and insight appear normal. Mood & affect appropriate.     Data Reviewed:   I have personally reviewed following labs and imaging studies   CBC: Recent Labs  Lab 08/27/21 0511 08/28/21 0425 08/29/21 0326  WBC 12.2* 11.9* 9.2  NEUTROABS 10.4* 10.0* 7.4  HGB 10.3* 10.4* 10.2*  HCT 31.3* 31.8* 30.8*  MCV 98.1 98.5 98.1  PLT 179 177 616    Basic Metabolic Panel: Recent Labs  Lab 08/27/21 0511 08/28/21 0425 08/29/21 0326  NA 134* 137 140  K 3.9 4.5 3.6  CL 101 102 106  CO2 27 27 28   GLUCOSE 86 78 149*  BUN 15 15 12   CREATININE 0.63 0.62 0.53*  CALCIUM 7.8* 7.9* 8.0*  MG 1.9 1.8 2.0  PHOS  --  2.5 2.3*    Liver Function Tests: Recent Labs  Lab 08/27/21 0511 08/28/21 0425 08/29/21 0326  AST 15 15 17   ALT 16 14 13   ALKPHOS 50 52 45  BILITOT 0.9 0.9 0.8  PROT 5.1* 5.3* 5.2*  ALBUMIN 2.1* 2.1* 2.1*    CBG: Recent Labs  Lab 08/29/21 1343  GLUCAP 175*    Microbiology Studies:   Recent Results (from the past 240 hour(s))  Resp Panel by RT-PCR (Flu A&B, Covid) Nasopharyngeal Swab     Status: None   Collection Time: 08/24/21  1:37  PM   Specimen: Nasopharyngeal Swab; Nasopharyngeal(NP) swabs in vial transport medium  Result Value Ref Range Status   SARS Coronavirus 2 by RT PCR NEGATIVE NEGATIVE Final    Comment: (NOTE) SARS-CoV-2 target nucleic acids are NOT DETECTED.  The SARS-CoV-2 RNA is generally detectable in upper respiratory specimens during the acute phase of infection. The lowest concentration of SARS-CoV-2 viral copies this assay can detect is 138 copies/mL. A negative result does not preclude SARS-Cov-2 infection and should not be used as the sole basis for treatment or other patient management decisions. A negative result may occur with  improper specimen collection/handling, submission of specimen other than nasopharyngeal swab, presence of viral mutation(s) within the areas targeted by this assay, and inadequate number of viral copies(<138 copies/mL). A negative result must be combined with clinical observations, patient history, and epidemiological information. The expected result is Negative.  Fact Sheet for Patients:  EntrepreneurPulse.com.au  Fact Sheet for Healthcare Providers:  IncredibleEmployment.be  This test is no t yet approved or cleared by the [place] FDA and  has been authorized for detection and/or diagnosis of SARS-CoV-2 by FDA under an Emergency Use Authorization (EUA). This EUA will remain  in effect (meaning this test can be used) for the duration of the COVID-19 declaration under Section 564(b)(1) of the Act, 21 U.S.C.section 360bbb-3(b)(1), unless the authorization is terminated  or revoked sooner.       Influenza A by PCR NEGATIVE NEGATIVE Final   Influenza B by PCR NEGATIVE NEGATIVE Final    Comment: (NOTE) The Xpert Xpress SARS-CoV-2/FLU/RSV plus assay is intended as an aid in the diagnosis of influenza from Nasopharyngeal swab specimens and should not be used as a sole basis for treatment. Nasal washings and aspirates are  unacceptable for Xpert Xpress SARS-CoV-2/FLU/RSV testing.  Fact Sheet for Patients: EntrepreneurPulse.com.au  Fact Sheet for Healthcare Providers: IncredibleEmployment.be  This test is not yet approved or cleared by the [place] FDA and has been authorized for detection and/or diagnosis of SARS-CoV-2 by FDA under an Emergency Use Authorization (EUA). This EUA will remain in effect (meaning this test can be used) for the duration of the COVID-19 declaration under Section 564(b)(1) of the Act, 21 U.S.C. section 360bbb-3(b)(1), unless the authorization is terminated or revoked.  Performed at [facility], [place] [address] [place], [place] [address]  Radiology Studies:  No results found.   Scheduled Meds:    Chlorhexidine Gluconate Cloth  6 each Topical Daily   enoxaparin (LOVENOX) injection  40 mg Subcutaneous Daily   feeding supplement  237 mL Oral BID BM   insulin aspart  0-15 Units Subcutaneous Q8H   pantoprazole (PROTONIX) IV  40 mg Intravenous Q24H    Continuous Infusions:    lactated ringers Stopped (08/29/21 1141)   piperacillin-tazobactam (ZOSYN)  IV 3.375 g (08/29/21 1141)   potassium PHOSPHATE IVPB (in mmol) 30 mmol (08/29/21 1137)   TPN ADULT (ION) 40 mL/hr at 08/28/21 1832   TPN ADULT (ION)       LOS: 4 days     Vernell Leep, MD, [place], [facility]. [facility]    To contact the attending provider between 7A-7P or the covering provider during after hours 7P-7A, please log into the web site [email or web address] and access using universal [place] password for that web site. If you do not have the password, please call the hospital operator.  08/29/2021, 4:04 PM

## 2021-08-30 DIAGNOSIS — K8591 Acute pancreatitis with uninfected necrosis, unspecified: Secondary | ICD-10-CM | POA: Diagnosis not present

## 2021-08-30 LAB — COMPREHENSIVE METABOLIC PANEL
ALT: 12 U/L (ref 0–44)
AST: 17 U/L (ref 15–41)
Albumin: 2 g/dL — ABNORMAL LOW (ref 3.5–5.0)
Alkaline Phosphatase: 47 U/L (ref 38–126)
Anion gap: 7 (ref 5–15)
BUN: 10 mg/dL (ref 8–23)
CO2: 27 mmol/L (ref 22–32)
Calcium: 7.6 mg/dL — ABNORMAL LOW (ref 8.9–10.3)
Chloride: 99 mmol/L (ref 98–111)
Creatinine, Ser: 0.55 mg/dL — ABNORMAL LOW (ref 0.61–1.24)
GFR, Estimated: >60 mL/min (ref >60)
Glucose, Bld: 141 mg/dL — ABNORMAL HIGH (ref 70–99)
Potassium: 4 mmol/L (ref 3.5–5.1)
Sodium: 133 mmol/L — ABNORMAL LOW (ref 135–145)
Total Bilirubin: 0.6 mg/dL (ref 0.3–1.2)
Total Protein: 5.2 g/dL — ABNORMAL LOW (ref 6.5–8.1)

## 2021-08-30 LAB — PHOSPHORUS: Phosphorus: 2.7 mg/dL (ref 2.5–4.6)

## 2021-08-30 LAB — GLUCOSE, CAPILLARY
Glucose-Capillary: 134 mg/dL — ABNORMAL HIGH (ref 70–99)
Glucose-Capillary: 132 mg/dL — ABNORMAL HIGH (ref 70–99)

## 2021-08-30 LAB — MAGNESIUM: Magnesium: 2 mg/dL (ref 1.7–2.4)

## 2021-08-30 MED ORDER — BISACODYL 10 MG RE SUPP: 10.0000 mg | Freq: Every day | RECTAL | Status: DC | PRN

## 2021-08-30 MED ORDER — POTASSIUM PHOSPHATES 15 MMOLE/5ML IV SOLN
12.0000 mmol | Freq: Once | INTRAVENOUS | Status: AC
  Administered 2021-08-30: 12 mmol via INTRAVENOUS
  Filled 2021-08-30: qty 4

## 2021-08-30 MED ORDER — TRAVASOL 10 % IV SOLN
INTRAVENOUS | Status: AC
  Filled 2021-08-30: qty 960

## 2021-08-30 MED ORDER — BISACODYL 10 MG RE SUPP
10.0000 mg | Freq: Once | RECTAL | Status: AC
  Administered 2021-08-30: 10 mg via RECTAL
  Filled 2021-08-30: qty 1

## 2021-08-30 NOTE — Progress Notes (Signed)
PHARMACY - TOTAL PARENTERAL NUTRITION CONSULT NOTE   Indication:  NPO for necrotizing pancreatitis  Patient Measurements: Height: 5\' 10"  (177.8 cm) Weight: 77.2 kg (170 lb 3.1 oz) IBW/kg (Calculated) : 73 TPN AdjBW (KG): 71.9 Body mass index is 24.42 kg/m. Usual Weight: 80 kg  Assessment: 13 yoM re-admitted for necrotizing pancreatitis and is NPO.  Initially planning for post-pyloric tube feeds, but unable to advance NGT d/t significant duodenal stricture from pancreatic abscess and surrounding edema. Pharmacy consulted to dose TPN.  Glucose / Insulin: No hx DM or medications - refusing CBG checks; SBG WNL this AM Electrolytes: Na borderline low; others stable WNL (phos improved to 2.7 but still requiring supplementation), corrected calcium 9.2 Renal: SCr, BUN, bicarb stable, WNL; UOP low or incompletely charted Hepatic: hepatic enzymes, tbili stable WNL; albumin remains low - TG WNL (9/19) I/O: NG clamped - MIVF: LR @ 50 GI Imaging: - 9/15 CTa/p: progressive necrotizing pancreatitis w/ 8 cm suspected abscess at pancreatic head, multiple other loculated collections along pancreatic body; common hepatic duct dilation w/ persistent gallstones in GB. - 9/17 UGI: minimal passage of contrast past duodenal stricture, likely d/t mass effect from pancreatic abscess/edema GI Surgeries / Procedures:  - 8/29: Pancreatic stent placed - 9/16: NG tube placed  Central access: PICC line ordered TPN start date: 9/19  Nutritional Goals:   RD Assessment (08/28/21):  Estimated Needs Total Energy Estimated Needs: 1800-2000 Total Protein Estimated Needs: 85-95g Total Fluid Estimated Needs: 2L/day  Current TPN formulation at 80 mL/hr provides 96 g of protein and 1939 kcals per day (Travasol 50 g/L; D70 15%, SMOFlipid 30 g/L)  Current Nutrition:  NPO, TPN at 40 ml/hr   Plan:  KPhos 12 mmol IV x 1 over 6 hours Increase TPN rate to 80 mL/hr goal rate at 1800  Electrolytes in TPN: no individual  changes given increasing TPN rate Na - 50 mEq/L K - 50 mEq/L Ca - 5 mEq/L Mg - 5 mEq/L Phos - 15 mmol/L Cl:Ac ratio 1:1 Add standard MVI and trace elements to TPN Continue Moderate q8h SSI and adjust as needed  Stop MIVF; further orders per MD Monitor TPN labs on Mon/Thurs   Royetta Asal, PharmD, [place] Please utilize Amion for appropriate phone number to reach the unit pharmacist ([place]) 08/30/2021 10:04 AM

## 2021-08-30 NOTE — Progress Notes (Signed)
[facility]astroenterology Progress Note  Hunter Ward 70 y.o. October 17, 1951  CC:  Necrotizing pancreatitis  Subjective: Patient reports continued upper abdominal pain.  He also reports he is nauseated "all the time" but has not had any vomiting.  Has not had a BM.  Reports passing flatus.  Encounter conducted with [employer] video interpreting services Rolena Infante [phone]).  ROS : Review of Systems  Respiratory:  Negative for cough and shortness of breath.   Gastrointestinal:  Positive for abdominal pain, constipation and nausea. Negative for blood in stool, diarrhea, heartburn, melena and vomiting.    Objective: Vital signs in last 24 hours: Vitals:   08/29/21 2055 08/30/21 0500  BP: 105/71 107/70  Pulse: 68 71  Resp: 18 18  Temp: 99.1 F (37.3 C) 98.6 F (37 C)  SpO2: 93% 93%    Physical Exam:  General:  Alert, cooperative, no distress  Head:  Normocephalic, without obvious abnormality, atraumatic  Eyes:  Anicteric sclera, EOMs intact  Lungs:   Clear to auscultation bilaterally, respirations unlabored  Heart:  Regular rate and rhythm, S1, S2 normal  Abdomen:   Soft with upper abdominal tenderness to palpation, sluggish but present bowel sounds, no peritoneal signs    Lab Results: Recent Labs    08/29/21 0326 08/30/21 0459  NA 140 133*  K 3.6 4.0  CL 106 99  CO2 28 27  GLUCOSE 149* 141*  BUN 12 10  CREATININE 0.53* 0.55*  CALCIUM 8.0* 7.6*  MG 2.0 2.0  PHOS 2.3* 2.7   Recent Labs    08/29/21 0326 08/30/21 0459  AST 17 17  ALT 13 12  ALKPHOS 45 47  BILITOT 0.8 0.6  PROT 5.2* 5.2*  ALBUMIN 2.1* 2.0*   Recent Labs    08/28/21 0425 08/29/21 0326  WBC 11.9* 9.2  NEUTROABS 10.0* 7.4  HGB 10.4* 10.2*  HCT 31.8* 30.8*  MCV 98.5 98.1  PLT 177 179   No results for input(s): LABPROT, INR in the last 72 hours.    Assessment: Necrotizing pancreatitis with suspected 8 cm abscess replacing the pancreatic head, as well as additional necrotic changes vs. organizing  pseudocysts -WBCs 9.2 as of yesterday 9/20 -Normal renal function: BUN 10/ Cr 0.55 -Unable to advance feeding tube 9/17 due to mass-effect in second portion of duodenum.  No contrast passed into distal aspect of the second portion of the duodenum.  Constipation  Hypoalbuminemia (2.0)  Plan: Continue NPO status.    Continue TPN.  Plan for upper GI series tomorrow to evaluate whether or not contrast can now pass into the second portion of the duodenum.  Start Dulcolax suppositories for constipation.  [facility] will follow.  Salley Slaughter PA-C 08/30/2021, 11:02 AM  Contact #  [phone]

## 2021-08-30 NOTE — Progress Notes (Addendum)
PROGRESS NOTE   Hunter Ward  YIF:[id number]    DOB: 1950-12-22    DOA: 08/24/2021  PCP: Gaynelle Arabian, MD   I have briefly reviewed patients previous medical records in [facility].  Chief Complaint  Patient presents with   Fever   Emesis   Abdominal Pain    Brief Narrative:   Hunter Ward is a 70 year old male with PMH pancreatitis, HTN, hx CCY (~30 yrs ago), deafness (uses ASL) who presented to the hospital with worsening abdomial pain and inability to keep any food down the past 3 days. His last meal kept down was on 08/22/21.   He was recently hospitalized from 08/05/21 to 08/12/21 due to gallstone pancreatitis.  Imaging at that time showed a sizeable gallbladder remnant in the GB fossa with evidence of severe acute pancreatitis due to small retained gallstones within the fossa; MRCP also showed choledocholithiasis with 2 stones within the CBD. He then underwent ERCP on 08/07/2021 removing 2 stones, sphincterotomy performed, and placement of a plastic stent in the ventral pancreatic duct. Plan at discharge was for patient to follow-up at [facility] with advanced GI and surgery for definitive stone removal from gallbladder remnant.   He was discharged on 08/12/2021 and due to minimal recovery at home with recurrence and ongoing abdominal pain with inability to tolerate nutrition, he has presented back for further evaluation.   On admission this hospitalization, he underwent repeat CT abdomen/pelvis.  This showed progressive necrotizing pancreatitis with concern for phlegmon or abscess development measuring 8 x 5.2 cm.  There was also suspected splenic vein thrombosis on CT for which he was started on heparin drip for.  Persistent gallstones were noted in the gallbladder remnant and stable common hepatic duct dilation. Liver Doppler was then performed which showed patent portal and splenic veins, therefore heparin drip was discontinued at this time.     Assessment & Plan:  Principal  Problem:   Necrotizing pancreatitis Active Problems:   HTN (hypertension)   Normocytic anemia   Hyponatremia   Mild protein malnutrition (HCC)   Deafness   Necrotizing pancreatitis - per CT A/P on 9/15: "Progressive necrotizing pancreatitis as above, with 8.0 x 5.2 cm suspected abscess replacing the pancreatic head. Multilocular fluid collections along the pancreatic body may reflect further necrotic changes versus organizing pseudocysts. There is only minimal normal enhancing pancreatic tissue identified." - unable to be drained/approachable via IR; recommended conservative management at this time; possibly can re-image in a few days if no significant improvement or any worsening - continue zosyn for now - GI also following, appreciate assistance  - continue NPO, IVF, pain/nausea control and now on TNA via PICC line. - splenic vein thrombosis ruled out, d/c'd heparin drip  - NGT in place but currently not to be used; not fully passed pylorus plus significant duodenal stricture/compression from pancreatic abscess and patient would not tolerate TF at this time - continue bowel rest and as per GI follow-up, upper GI series 9/22 to see if contrast will advance into the duodenum and if so can advance nasal enteric feeding tube into the duodenum and start tube feeds. -As per GI, necrotizing pancreatitis with pseudocyst and enteral tube that cannot be advanced into the duodenum due to pancreatitis.   HTN (hypertension) - BP currently normal. - Hold off on any antihypertensive agents at this time   Deafness -Communicated using ASL interpretor.   Mild protein malnutrition (HCC) - Patient's BMI is Body mass index is 22.74 kg/m.. - Patient  has the following signs/symptoms consistent with PCM: (fat loss, muscle loss, muscle wasting). - check pre-albumin (<5 mg/dL) -Started TPN after PICC placed    Hyponatremia - Sodium 132 on admission - Presumed hypovolemic hyponatremia from GI losses due  to nausea/vomiting and poor oral intake -Resolved.  Hypophosphatemia - Being replaced and monitored by pharmacy   Normocytic anemia - baseline Hgb was ~14 g/dL in August however has now downtrended to ~10 g/dL; no overt bleeding reported; likely due to acute and prolonged illness -Hemoglobin stable in the 10 g range.   Constipation - Dulcolax suppository & PRN   Body mass index is 24.42 kg/m.  Nutritional Status Nutrition Problem: Increased nutrient needs Etiology: acute illness Signs/Symptoms: estimated needs Interventions: Ensure Surgery, MVI  Pressure Ulcer:     DVT prophylaxis: enoxaparin (LOVENOX) injection 40 mg Start: 08/26/21 1000     Code Status: Full Code Family Communication: None at bedside. Disposition:  Status is: Inpatient  Remains inpatient appropriate because:Inpatient level of care appropriate due to severity of illness  Dispo: The patient is from: Home              Anticipated d/c is to: Home              Patient currently is not medically stable to d/c.   Difficult to place patient No        Consultants:   [facility]  Procedures:   Core track Right upper extremity PICC line with TNA  Antimicrobials:    Anti-infectives (From admission, onward)    Start     Dose/Rate Route Frequency Ordered Stop   08/25/21 0200  piperacillin-tazobactam (ZOSYN) IVPB 3.375 g        3.375 g 12.5 mL/hr over 240 Minutes Intravenous Every 8 hours 08/24/21 2009     08/24/21 2200  piperacillin-tazobactam (ZOSYN) IVPB 3.375 g  Status:  Discontinued        3.375 g 100 mL/hr over 30 Minutes Intravenous Every 8 hours 08/24/21 1931 08/24/21 2009   08/24/21 1915  piperacillin-tazobactam (ZOSYN) IVPB 3.375 g        3.375 g 100 mL/hr over 30 Minutes Intravenous  Once 08/24/21 1911 08/24/21 2030         Subjective:  Patient interviewed and examined along with the GI team ASL interpreter.  Upper abdominal discomfort.  No BM for several days.  Passing flatus.   Reported nausea but no vomiting.  Feels like he may need surgery.  Objective:   Vitals:   08/29/21 1200 08/29/21 2055 08/30/21 0500 08/30/21 1311  BP: 107/67 105/71 107/70 111/70  Pulse: 60 68 71 78  Resp: 16 18 18 18   Temp: 98.3 F (36.8 C) 99.1 F (37.3 C) 98.6 F (37 C) 98.5 F (36.9 C)  TempSrc: Oral Oral Oral Oral  SpO2: 96% 93% 93% 94%  Weight:      Height:        General exam: Elderly male, moderately built and nourished, lying comfortably supine in bed. Respiratory system: Clear to auscultation.  No increased work of breathing. Cardiovascular system: S1 and S2 heard, RRR.  No JVD or pedal edema.  Telemetry personally reviewed: Sinus rhythm. Gastrointestinal system: Abdomen is nondistended, soft.  Epigastric and periumbilical region tenderness without rigidity/guarding/tenderness. No organomegaly or masses felt. Normal bowel sounds heard. Central nervous system: Alert and oriented. No focal neurological deficits. Extremities: Symmetric 5 x 5 power. Skin: No rashes, lesions or ulcers Psychiatry: Judgement and insight appear normal. Mood & affect appropriate.  Data Reviewed:   I have personally reviewed following labs and imaging studies   CBC: Recent Labs  Lab 08/27/21 0511 08/28/21 0425 08/29/21 0326  WBC 12.2* 11.9* 9.2  NEUTROABS 10.4* 10.0* 7.4  HGB 10.3* 10.4* 10.2*  HCT 31.3* 31.8* 30.8*  MCV 98.1 98.5 98.1  PLT 179 177 431    Basic Metabolic Panel: Recent Labs  Lab 08/28/21 0425 08/29/21 0326 08/30/21 0459  NA 137 140 133*  K 4.5 3.6 4.0  CL 102 106 99  CO2 27 28 27   GLUCOSE 78 149* 141*  BUN 15 12 10   CREATININE 0.62 0.53* 0.55*  CALCIUM 7.9* 8.0* 7.6*  MG 1.8 2.0 2.0  PHOS 2.5 2.3* 2.7    Liver Function Tests: Recent Labs  Lab 08/28/21 0425 08/29/21 0326 08/30/21 0459  AST 15 17 17   ALT 14 13 12   ALKPHOS 52 45 47  BILITOT 0.9 0.8 0.6  PROT 5.3* 5.2* 5.2*  ALBUMIN 2.1* 2.1* 2.0*    CBG: Recent Labs  Lab 08/29/21 2111  08/30/21 0502 08/30/21 1158  GLUCAP 155* 134* 132*    Microbiology Studies:   Recent Results (from the past 240 hour(s))  Resp Panel by RT-PCR (Flu A&B, Covid) Nasopharyngeal Swab     Status: None   Collection Time: 08/24/21  1:37 PM   Specimen: Nasopharyngeal Swab; Nasopharyngeal(NP) swabs in vial transport medium  Result Value Ref Range Status   SARS Coronavirus 2 by RT PCR NEGATIVE NEGATIVE Final    Comment: (NOTE) SARS-CoV-2 target nucleic acids are NOT DETECTED.  The SARS-CoV-2 RNA is generally detectable in upper respiratory specimens during the acute phase of infection. The lowest concentration of SARS-CoV-2 viral copies this assay can detect is 138 copies/mL. A negative result does not preclude SARS-Cov-2 infection and should not be used as the sole basis for treatment or other patient management decisions. A negative result may occur with  improper specimen collection/handling, submission of specimen other than nasopharyngeal swab, presence of viral mutation(s) within the areas targeted by this assay, and inadequate number of viral copies(<138 copies/mL). A negative result must be combined with clinical observations, patient history, and epidemiological information. The expected result is Negative.  Fact Sheet for Patients:  EntrepreneurPulse.com.au  Fact Sheet for Healthcare Providers:  IncredibleEmployment.be  This test is no t yet approved or cleared by the [place] FDA and  has been authorized for detection and/or diagnosis of SARS-CoV-2 by FDA under an Emergency Use Authorization (EUA). This EUA will remain  in effect (meaning this test can be used) for the duration of the COVID-19 declaration under Section 564(b)(1) of the Act, 21 U.S.C.section 360bbb-3(b)(1), unless the authorization is terminated  or revoked sooner.       Influenza A by PCR NEGATIVE NEGATIVE Final   Influenza B by PCR NEGATIVE NEGATIVE Final     Comment: (NOTE) The Xpert Xpress SARS-CoV-2/FLU/RSV plus assay is intended as an aid in the diagnosis of influenza from Nasopharyngeal swab specimens and should not be used as a sole basis for treatment. Nasal washings and aspirates are unacceptable for Xpert Xpress SARS-CoV-2/FLU/RSV testing.  Fact Sheet for Patients: EntrepreneurPulse.com.au  Fact Sheet for Healthcare Providers: IncredibleEmployment.be  This test is not yet approved or cleared by the [place] FDA and has been authorized for detection and/or diagnosis of SARS-CoV-2 by FDA under an Emergency Use Authorization (EUA). This EUA will remain in effect (meaning this test can be used) for the duration of the COVID-19 declaration under Section 564(b)(1) of  the Act, 21 U.S.C. section 360bbb-3(b)(1), unless the authorization is terminated or revoked.  Performed at [facility], [place] [address], [place], [place] [address]      Radiology Studies:  No results found.   Scheduled Meds:    Chlorhexidine Gluconate Cloth  6 each Topical Daily   enoxaparin (LOVENOX) injection  40 mg Subcutaneous Daily   feeding supplement  237 mL Oral BID BM   insulin aspart  0-15 Units Subcutaneous Q8H   pantoprazole (PROTONIX) IV  40 mg Intravenous Q24H    Continuous Infusions:    piperacillin-tazobactam (ZOSYN)  IV 3.375 g (08/30/21 1251)   potassium PHOSPHATE IVPB (in mmol) 12 mmol (08/30/21 1257)   TPN ADULT (ION) 60 mL/hr at 08/29/21 1731   TPN ADULT (ION)       LOS: 5 days     Vernell Leep, MD, [place], [facility]. [facility]    To contact the attending provider between 7A-7P or the covering provider during after hours 7P-7A, please log into the web site [email or web address] and access using universal [place] password for that web site. If you do not have the password, please call the hospital operator.  08/30/2021, 4:12 PM

## 2021-08-31 ENCOUNTER — Inpatient Hospital Stay (HOSPITAL_COMMUNITY): Payer: Medicare Other

## 2021-08-31 DIAGNOSIS — K8591 Acute pancreatitis with uninfected necrosis, unspecified: Secondary | ICD-10-CM | POA: Diagnosis not present

## 2021-08-31 LAB — COMPREHENSIVE METABOLIC PANEL
ALT: 13 U/L (ref 0–44)
AST: 20 U/L (ref 15–41)
Albumin: 2 g/dL — ABNORMAL LOW (ref 3.5–5.0)
Alkaline Phosphatase: 48 U/L (ref 38–126)
Anion gap: 4 — ABNORMAL LOW (ref 5–15)
BUN: 10 mg/dL (ref 8–23)
CO2: 25 mmol/L (ref 22–32)
Calcium: 8.1 mg/dL — ABNORMAL LOW (ref 8.9–10.3)
Chloride: 103 mmol/L (ref 98–111)
Creatinine, Ser: 0.59 mg/dL — ABNORMAL LOW (ref 0.61–1.24)
GFR, Estimated: >60 mL/min (ref >60)
Glucose, Bld: 140 mg/dL — ABNORMAL HIGH (ref 70–99)
Potassium: 4.3 mmol/L (ref 3.5–5.1)
Sodium: 132 mmol/L — ABNORMAL LOW (ref 135–145)
Total Bilirubin: 0.4 mg/dL (ref 0.3–1.2)
Total Protein: 5.4 g/dL — ABNORMAL LOW (ref 6.5–8.1)

## 2021-08-31 LAB — MAGNESIUM: Magnesium: 1.9 mg/dL (ref 1.7–2.4)

## 2021-08-31 LAB — PHOSPHORUS: Phosphorus: 3.1 mg/dL (ref 2.5–4.6)

## 2021-08-31 LAB — GLUCOSE, CAPILLARY
Glucose-Capillary: 144 mg/dL — ABNORMAL HIGH (ref 70–99)
Glucose-Capillary: 124 mg/dL — ABNORMAL HIGH (ref 70–99)

## 2021-08-31 MED ORDER — TRACE MINERALS CU-MN-SE-ZN 300-55-60-3000 MCG/ML IV SOLN
INTRAVENOUS | Status: AC
  Filled 2021-08-31: qty 960

## 2021-08-31 MED ORDER — MORPHINE SULFATE (PF) 2 MG/ML IV SOLN
2.0000 mg | Freq: Once | INTRAVENOUS | Status: AC
  Administered 2021-08-31: 2 mg via INTRAVENOUS
  Filled 2021-08-31: qty 1

## 2021-08-31 MED ORDER — MAGNESIUM SULFATE 2 GM/50ML IV SOLN
2.0000 g | Freq: Once | INTRAVENOUS | Status: AC
  Administered 2021-08-31: 2 g via INTRAVENOUS
  Filled 2021-08-31: qty 50

## 2021-08-31 MED ORDER — IOHEXOL 300 MG/ML  SOLN
150.0000 mL | Freq: Once | INTRAMUSCULAR | Status: AC | PRN
  Administered 2021-08-31: 150 mL via ORAL
  Administered 2021-09-01: 50 mL via ORAL

## 2021-08-31 NOTE — Care Management Important Message (Signed)
Important Message  Patient Details IM Letter placed in the Patient's room. Name: Hunter Ward MRN: [id number] Date of Birth: 1951/07/31   Medicare Important Message Given:  Yes     Kerin Salen 08/31/2021, 1:30 PM

## 2021-08-31 NOTE — Progress Notes (Signed)
Patient's small bore feeding tube came out earlier today. Notified to attempt to replace. Attempted x 3, once in right nostril and twice in left nostril, unable to advance.  Notified IR/Fluoroscopy in regards to situation and will replace tomorrow in IR.

## 2021-08-31 NOTE — Progress Notes (Addendum)
Patient c/o 6/10 pain in  abdomen and next PRN pain med cannot be given until 2100. Paged Jeannette Corpus. New order for one-time does of IV Morphine 2mg . Will administer and continue to monitor.

## 2021-08-31 NOTE — Progress Notes (Signed)
PROGRESS NOTE   Hunter Ward  WUJ:[id number]    DOB: [birth year]    DOA: 08/24/2021  PCP: Gaynelle Arabian, MD   I have briefly reviewed patients previous medical records in [facility].  Chief Complaint  Patient presents with   Fever   Emesis   Abdominal Pain    Brief Narrative:   Hunter Ward is a 70 year old male with PMH pancreatitis, HTN, hx CCY (~30 yrs ago), deafness (uses ASL) who presented to the hospital with worsening abdomial pain and inability to keep any food down the past 3 days. His last meal kept down was on 08/22/21.   He was recently hospitalized from 08/05/21 to 08/12/21 due to gallstone pancreatitis.  Imaging at that time showed a sizeable gallbladder remnant in the GB fossa with evidence of severe acute pancreatitis due to small retained gallstones within the fossa; MRCP also showed choledocholithiasis with 2 stones within the CBD. He then underwent ERCP on 08/07/2021 removing 2 stones, sphincterotomy performed, and placement of a plastic stent in the ventral pancreatic duct. Plan at discharge was for patient to follow-up at [facility] with advanced GI and surgery for definitive stone removal from gallbladder remnant.   He was discharged on 08/12/2021 and due to minimal recovery at home with recurrence and ongoing abdominal pain with inability to tolerate nutrition, he has presented back for further evaluation.   On admission this hospitalization, he underwent repeat CT abdomen/pelvis.  This showed progressive necrotizing pancreatitis with concern for phlegmon or abscess development measuring 8 x 5.2 cm.  There was also suspected splenic vein thrombosis on CT for which he was started on heparin drip for.  Persistent gallstones were noted in the gallbladder remnant and stable common hepatic duct dilation. Liver Doppler was then performed which showed patent portal and splenic veins, therefore heparin drip was discontinued at this time.     Assessment & Plan:  Principal  Problem:   Necrotizing pancreatitis Active Problems:   HTN (hypertension)   Normocytic anemia   Hyponatremia   Mild protein malnutrition (HCC)   Deafness   Necrotizing pancreatitis - per CT A/P on 9/15: "Progressive necrotizing pancreatitis as above, with 8.0 x 5.2 cm suspected abscess replacing the pancreatic head. Multilocular fluid collections along the pancreatic body may reflect further necrotic changes versus organizing pseudocysts. There is only minimal normal enhancing pancreatic tissue identified." - unable to be drained/approachable via IR; recommended conservative management at this time; possibly can re-image in a few days if no significant improvement or any worsening - continue zosyn for now - GI also following, appreciate assistance  - continue NPO, IVF, pain/nausea control and now on TNA via PICC line. - splenic vein thrombosis ruled out, d/c'd heparin drip  -NG tube initially placed but not used because it had not fully passed pylorus due to significant duodenal stricture/compression from pancreatic abscess.  Patient was treated with bowel rest, TPN via PICC line. - 9/22, upper GI series was without gastric outlet obstruction.  As per GI, IR recommended nursing advance NG tube into the stomach with hope that peristalsis will advance it into the duodenum.  However, per nursing the NG tube completely fell out.  IR planning to place back NG tube on 9/23. -As per GI, necrotizing pancreatitis with pseudocyst and enteral tube that cannot be advanced into the duodenum due to pancreatitis.   HTN (hypertension) - BP currently normal. - Hold off on any antihypertensive agents at this time   Deafness -Communicated using ASL interpretor.  Mild protein malnutrition (HCC) - Patient's BMI is Body mass index is 22.74 kg/m.. - Patient has the following signs/symptoms consistent with PCM: (fat loss, muscle loss, muscle wasting). - check pre-albumin (<5 mg/dL) -Started TPN after PICC  placed    Hyponatremia - Sodium 132 on admission - Presumed hypovolemic hyponatremia from GI losses due to nausea/vomiting and poor oral intake -Mild and stable.  Hypophosphatemia - Being replaced and monitored by pharmacy   Normocytic anemia - baseline Hgb was ~14 g/dL in August however has now downtrended to ~10 g/dL; no overt bleeding reported; likely due to acute and prolonged illness -Hemoglobin stable in the 10 g range.   Constipation - Dulcolax suppository & PRN.  Had BM yesterday.   Body mass index is 24.42 kg/m.  Nutritional Status Nutrition Problem: Increased nutrient needs Etiology: acute illness Signs/Symptoms: estimated needs Interventions: Ensure Surgery, MVI  Pressure Ulcer:     DVT prophylaxis: enoxaparin (LOVENOX) injection 40 mg Start: 08/26/21 1000     Code Status: Full Code Family Communication: None at bedside. Disposition:  Status is: Inpatient  Remains inpatient appropriate because:Inpatient level of care appropriate due to severity of illness  Dispo: The patient is from: Home              Anticipated d/c is to: Home              Patient currently is not medically stable to d/c.   Difficult to place patient No        Consultants:   [facility]  Procedures:   Core track Right upper extremity PICC line with TNA  Antimicrobials:    Anti-infectives (From admission, onward)    Start     Dose/Rate Route Frequency Ordered Stop   08/25/21 0200  piperacillin-tazobactam (ZOSYN) IVPB 3.375 g        3.375 g 12.5 mL/hr over 240 Minutes Intravenous Every 8 hours 08/24/21 2009     08/24/21 2200  piperacillin-tazobactam (ZOSYN) IVPB 3.375 g  Status:  Discontinued        3.375 g 100 mL/hr over 30 Minutes Intravenous Every 8 hours 08/24/21 1931 08/24/21 2009   08/24/21 1915  piperacillin-tazobactam (ZOSYN) IVPB 3.375 g        3.375 g 100 mL/hr over 30 Minutes Intravenous  Once 08/24/21 1911 08/24/21 2030         Subjective:  No new  complaints when seen this morning.  Was on his way to radiology for upper GI series.  Objective:   Vitals:   08/30/21 1311 08/30/21 2023 08/31/21 0457 08/31/21 1225  BP: 111/70 112/68 119/75 109/72  Pulse: 78 70 76 73  Resp: 18 16 18 18   Temp: 98.5 F (36.9 C) 99.6 F (37.6 C) 98.1 F (36.7 C) 98 F (36.7 C)  TempSrc: Oral Oral Oral Oral  SpO2: 94% 95% 96% 95%  Weight:      Height:        General exam: Elderly male, moderately built and nourished, lying comfortably supine in bed. Respiratory system: Clear to auscultation.  No increased work of breathing. Cardiovascular system: S1 and S2 heard, RRR.  No JVD or pedal edema.  Gastrointestinal system: Abdomen is nondistended, soft and nontender today.  Normal bowel sounds heard.  Still had NG tube this morning Central nervous system: Alert and oriented. No focal neurological deficits. Extremities: Symmetric 5 x 5 power. Skin: No rashes, lesions or ulcers Psychiatry: Judgement and insight appear normal. Mood & affect appropriate.  Data Reviewed:   I have personally reviewed following labs and imaging studies   CBC: Recent Labs  Lab 08/27/21 0511 08/28/21 0425 08/29/21 0326  WBC 12.2* 11.9* 9.2  NEUTROABS 10.4* 10.0* 7.4  HGB 10.3* 10.4* 10.2*  HCT 31.3* 31.8* 30.8*  MCV 98.1 98.5 98.1  PLT 179 177 761    Basic Metabolic Panel: Recent Labs  Lab 08/29/21 0326 08/30/21 0459 08/31/21 0444  NA 140 133* 132*  K 3.6 4.0 4.3  CL 106 99 103  CO2 28 27 25   GLUCOSE 149* 141* 140*  BUN 12 10 10   CREATININE 0.53* 0.55* 0.59*  CALCIUM 8.0* 7.6* 8.1*  MG 2.0 2.0 1.9  PHOS 2.3* 2.7 3.1    Liver Function Tests: Recent Labs  Lab 08/29/21 0326 08/30/21 0459 08/31/21 0444  AST 17 17 20   ALT 13 12 13   ALKPHOS 45 47 48  BILITOT 0.8 0.6 0.4  PROT 5.2* 5.2* 5.4*  ALBUMIN 2.1* 2.0* 2.0*    CBG: Recent Labs  Lab 08/30/21 0502 08/30/21 1158 08/31/21 1456  GLUCAP 134* 132* 144*    Microbiology Studies:    Recent Results (from the past 240 hour(s))  Resp Panel by RT-PCR (Flu A&B, Covid) Nasopharyngeal Swab     Status: None   Collection Time: 08/24/21  1:37 PM   Specimen: Nasopharyngeal Swab; Nasopharyngeal(NP) swabs in vial transport medium  Result Value Ref Range Status   SARS Coronavirus 2 by RT PCR NEGATIVE NEGATIVE Final    Comment: (NOTE) SARS-CoV-2 target nucleic acids are NOT DETECTED.  The SARS-CoV-2 RNA is generally detectable in upper respiratory specimens during the acute phase of infection. The lowest concentration of SARS-CoV-2 viral copies this assay can detect is 138 copies/mL. A negative result does not preclude SARS-Cov-2 infection and should not be used as the sole basis for treatment or other patient management decisions. A negative result may occur with  improper specimen collection/handling, submission of specimen other than nasopharyngeal swab, presence of viral mutation(s) within the areas targeted by this assay, and inadequate number of viral copies(<138 copies/mL). A negative result must be combined with clinical observations, patient history, and epidemiological information. The expected result is Negative.  Fact Sheet for Patients:  EntrepreneurPulse.com.au  Fact Sheet for Healthcare Providers:  IncredibleEmployment.be  This test is no t yet approved or cleared by the [place] FDA and  has been authorized for detection and/or diagnosis of SARS-CoV-2 by FDA under an Emergency Use Authorization (EUA). This EUA will remain  in effect (meaning this test can be used) for the duration of the COVID-19 declaration under Section 564(b)(1) of the Act, 21 U.S.C.section 360bbb-3(b)(1), unless the authorization is terminated  or revoked sooner.       Influenza A by PCR NEGATIVE NEGATIVE Final   Influenza B by PCR NEGATIVE NEGATIVE Final    Comment: (NOTE) The Xpert Xpress SARS-CoV-2/FLU/RSV plus assay is intended as an  aid in the diagnosis of influenza from Nasopharyngeal swab specimens and should not be used as a sole basis for treatment. Nasal washings and aspirates are unacceptable for Xpert Xpress SARS-CoV-2/FLU/RSV testing.  Fact Sheet for Patients: EntrepreneurPulse.com.au  Fact Sheet for Healthcare Providers: IncredibleEmployment.be  This test is not yet approved or cleared by the [place] FDA and has been authorized for detection and/or diagnosis of SARS-CoV-2 by FDA under an Emergency Use Authorization (EUA). This EUA will remain in effect (meaning this test can be used) for the duration of the COVID-19 declaration under Section 564(b)(1) of  the Act, 21 U.S.C. section 360bbb-3(b)(1), unless the authorization is terminated or revoked.  Performed at [facility], [place] [address], [place], [place] [address]      Radiology Studies:  DG UGI W SINGLE CM (SOL OR THIN BA)  Result Date: 08/31/2021 CLINICAL DATA:  70 year old male with possible gastric outlet obstruction clinically suspected. EXAM: WATER SOLUBLE UPPER GI SERIES TECHNIQUE: Single-column upper GI series was performed using water soluble contrast. CONTRAST:  124mL OMNIPAQUE IOHEXOL 300 MG/ML  SOLN COMPARISON:  No priors. FLUOROSCOPY TIME:  Fluoroscopy Time:  1 minute and 45 seconds Radiation Exposure Index (if provided by the fluoroscopic device): 18.4 mGy FINDINGS: Multiple fluoroscopic images were obtained following ingestion of 150 mL of Omnipaque 300. These demonstrated nonspecific esophageal motility disorder with intermittent failure to fully propagate primary peristaltic waves. Esophagus was otherwise structurally normal in appearance. Limited single contrast imaging of the stomach demonstrated no definite gastric mass or mucosal abnormality within the limitations of today's examination. Patient was placed in an RPO position, shortly thereafter, contrast was noted to traverse  the pylorus into the duodenum. IMPRESSION: 1. No evidence of gastric outlet obstruction. Electronically Signed   By: Vinnie Langton M.D.   On: 08/31/2021 10:53     Scheduled Meds:    Chlorhexidine Gluconate Cloth  6 each Topical Daily   enoxaparin (LOVENOX) injection  40 mg Subcutaneous Daily   feeding supplement  237 mL Oral BID BM   insulin aspart  0-15 Units Subcutaneous Q8H   pantoprazole (PROTONIX) IV  40 mg Intravenous Q24H    Continuous Infusions:    piperacillin-tazobactam (ZOSYN)  IV 3.375 g (08/31/21 1203)   TPN ADULT (ION) 80 mL/hr at 08/30/21 1813   TPN ADULT (ION)       LOS: 6 days     Vernell Leep, MD, [place], [facility]. [facility]    To contact the attending provider between 7A-7P or the covering provider during after hours 7P-7A, please log into the web site [email or web address] and access using universal [place] password for that web site. If you do not have the password, please call the hospital operator.  08/31/2021, 4:46 PM

## 2021-08-31 NOTE — Progress Notes (Signed)
[facility] Gastroenterology Progress Note  Hunter Ward 70 y.o. [birth year]  CC:  Necrotizing pancreatitis   Subjective: Patient reports continued nausea and abdominal pain. Has not yet had a BM.  AM[employer] video interpreting services utilized for this encounter (Dani #[id number])  ROS : Review of Systems  Cardiovascular:  Negative for chest pain and palpitations.  Gastrointestinal:  Positive for abdominal pain, constipation and nausea. Negative for blood in stool, diarrhea, heartburn, melena and vomiting.     Objective: Vital signs in last 24 hours: Vitals:   08/30/21 2023 08/31/21 0457  BP: 112/68 119/75  Pulse: 70 76  Resp: 16 18  Temp: 99.6 F (37.6 C) 98.1 F (36.7 C)  SpO2: 95% 96%    Physical Exam:  General:  Alert, cooperative, no distress  Head:  Normocephalic, without obvious abnormality, atraumatic  Eyes:  Anicteric sclera, EOMs intact  Lungs:   Clear to auscultation bilaterally, respirations unlabored  Heart:  Regular rate and rhythm, S1, S2 normal  Abdomen:   Soft with upper abdominal tenderness to palpation, sluggish but present bowel sounds, no peritoneal signs    Lab Results: Recent Labs    08/30/21 0459 08/31/21 0444  NA 133* 132*  K 4.0 4.3  CL 99 103  CO2 27 25  GLUCOSE 141* 140*  BUN 10 10  CREATININE 0.55* 0.59*  CALCIUM 7.6* 8.1*  MG 2.0 1.9  PHOS 2.7 3.1   Recent Labs    08/30/21 0459 08/31/21 0444  AST 17 20  ALT 12 13  ALKPHOS 47 48  BILITOT 0.6 0.4  PROT 5.2* 5.4*  ALBUMIN 2.0* 2.0*   Recent Labs    08/29/21 0326  WBC 9.2  NEUTROABS 7.4  HGB 10.2*  HCT 30.8*  MCV 98.1  PLT 179   No results for input(s): LABPROT, INR in the last 72 hours.    Assessment: Necrotizing pancreatitis with suspected 8 cm abscess replacing the pancreatic head, as well as additional necrotic changes vs. organizing pseudocysts -WBCs 9.2 as of 9/20 -Normal renal function: BUN 10/ Cr 0.59 -Unable to advance feeding tube 9/17 due to mass-effect in  second portion of duodenum.  No contrast passed into distal aspect of the second portion of the duodenum. -Repeat UGI 9/22 was without evidence of gastric outlet obstruction, contrast traversed the pylorus and went into the duodenum   Constipation   Hypoalbuminemia (2.0)  Plan: Re-consulted IR to advance feeding tube into jejunum.  Once this has been completed, recommend starting tube feeds.  Once feeding tube is advanced, we can also start Miralax daily per tube.  [facility] will follow.  Salley Slaughter PA-C 08/31/2021, 11:01 AM  Contact #  [phone]

## 2021-08-31 NOTE — Progress Notes (Signed)
PHARMACY - TOTAL PARENTERAL NUTRITION CONSULT NOTE   Indication:  NPO for necrotizing pancreatitis  Patient Measurements: Height: 5\' 10"  (177.8 cm) Weight: 77.2 kg (170 lb 3.1 oz) IBW/kg (Calculated) : 73 TPN AdjBW (KG): 71.9 Body mass index is 24.42 kg/m. Usual Weight: 80 kg  Assessment: 89 yoM re-admitted for necrotizing pancreatitis and is NPO.  Initially planning for post-pyloric tube feeds, but unable to advance NGT d/t significant duodenal stricture from pancreatic abscess and surrounding edema. Pharmacy consulted to dose TPN.  Glucose / Insulin: No hx DM or medications - refusing CBG checks; SBG 140 this AM, 4 units of insulin aspart administered in previous 24 hours Electrolytes: Na low at 132; others WNL (phos now WNL after supplementation yesterday, magnesium borderline low at 1.9 requiring supplementation today), corrected calcium 9.2 Renal: SCr, BUN, bicarb stable, WNL; UOP 4250, Net -2197.2 Hepatic: hepatic enzymes, tbili stable WNL; albumin remains low - TG WNL (9/19) I/O: NG clamped GI Imaging: - 9/15 CTa/p: progressive necrotizing pancreatitis w/ 8 cm suspected abscess at pancreatic head, multiple other loculated collections along pancreatic body; common hepatic duct dilation w/ persistent gallstones in GB. - 9/17 UGI: minimal passage of contrast past duodenal stricture, likely d/t mass effect from pancreatic abscess/edema GI Surgeries / Procedures:  - 8/29: Pancreatic stent placed - 9/16: NG tube placed - 9/22: upper GI series planned  Central access: PICC line ordered TPN start date: 9/19  Nutritional Goals:   RD Assessment (08/28/21):  Estimated Needs Total Energy Estimated Needs: 1800-2000 Total Protein Estimated Needs: 85-95g Total Fluid Estimated Needs: 2L/day  Current TPN formulation at 80 mL/hr provides 96 g of protein and 1939 kcals per day (Travasol 50 g/L; D70 15%, SMOFlipid 30 g/L)  Current Nutrition:  NPO, TPN at 80 ml/hr   Plan:  Magnesium 2  g IV x 1 Continue TPN at goal rate 80 mL/hr at 1800  Electrolytes in TPN:  Increase Na - 80 mEq/L K - 50 mEq/L Ca - 5 mEq/L Increase Mg - 8 mEq/L Phos - 15 mmol/L Cl:Ac ratio 1:1 Add standard MVI and trace elements to TPN Continue Moderate q8h SSI and adjust as needed  No current MIVF; further orders per MD Monitor TPN labs on Mon/Thurs BMP, Mg, Phos tomorrow AM   Royetta Asal, PharmD, [place] Please utilize Amion for appropriate phone number to reach the unit pharmacist ([place]) 08/31/2021 7:59 AM

## 2021-09-01 ENCOUNTER — Inpatient Hospital Stay (HOSPITAL_COMMUNITY): Payer: Medicare Other

## 2021-09-01 ENCOUNTER — Other Ambulatory Visit: Payer: Medicare Other

## 2021-09-01 DIAGNOSIS — K8591 Acute pancreatitis with uninfected necrosis, unspecified: Secondary | ICD-10-CM | POA: Diagnosis not present

## 2021-09-01 LAB — BASIC METABOLIC PANEL
Anion gap: 6 (ref 5–15)
BUN: 12 mg/dL (ref 8–23)
CO2: 25 mmol/L (ref 22–32)
Calcium: 8.1 mg/dL — ABNORMAL LOW (ref 8.9–10.3)
Chloride: 102 mmol/L (ref 98–111)
Creatinine, Ser: 0.54 mg/dL — ABNORMAL LOW (ref 0.61–1.24)
GFR, Estimated: >60 mL/min (ref >60)
Glucose, Bld: 127 mg/dL — ABNORMAL HIGH (ref 70–99)
Potassium: 4.7 mmol/L (ref 3.5–5.1)
Sodium: 133 mmol/L — ABNORMAL LOW (ref 135–145)

## 2021-09-01 LAB — GLUCOSE, CAPILLARY
Glucose-Capillary: 126 mg/dL — ABNORMAL HIGH (ref 70–99)
Glucose-Capillary: 122 mg/dL — ABNORMAL HIGH (ref 70–99)

## 2021-09-01 LAB — MAGNESIUM: Magnesium: 2.3 mg/dL (ref 1.7–2.4)

## 2021-09-01 LAB — PHOSPHORUS: Phosphorus: 3 mg/dL (ref 2.5–4.6)

## 2021-09-01 MED ORDER — TRAVASOL 10 % IV SOLN
INTRAVENOUS | Status: AC
  Filled 2021-09-01: qty 921.6

## 2021-09-01 NOTE — Progress Notes (Signed)
PHARMACY - TOTAL PARENTERAL NUTRITION CONSULT NOTE   Indication:  NPO for necrotizing pancreatitis  Patient Measurements: Height: _0  (177.8 cm) Weight: 77.2 kg (170 lb 3.1 oz) IBW/kg (Calculated) : 73 TPN AdjBW (KG): 71.9 Body mass index is 24.42 kg/m. Usual Weight: 80 kg  Assessment: 77 yoM re-admitted for necrotizing pancreatitis and is NPO.  Initially planning for post-pyloric tube feeds, but unable to advance NGT d/t significant duodenal stricture from pancreatic abscess and surrounding edema. Pharmacy consulted to manage TPN.  Glucose / Insulin: No hx DM. CBG goal <180 -BG range: 124 - 140. mSSI q8h; 6 units required/24 hrs -Documented that patient is refusing some CBG checks Electrolytes: Na (133) slightly low; Mg (2.3) & K (4.7) are WNL but both on upper end of range. Other lytes WNL, including CorrCa (9.7) Renal: SCr, BUN, bicarb stable & WNL Hepatic: LFTs, T.bili, Alk Phos and TG all WNL I/O: Strict I/O not measured. UOP 3005 mL + 1 unmeasured; stool x1 unmeasured GI Imaging: - 9/15 CTa/p: progressive necrotizing pancreatitis w/ 8 cm suspected abscess at pancreatic head, multiple other loculated collections along pancreatic body; common hepatic duct dilation w/ persistent gallstones in GB. - 9/17 UGI: minimal passage of contrast past duodenal stricture, likely d/t mass effect from pancreatic abscess/edema GI Surgeries / Procedures:  - 8/29: Pancreatic stent placed - 9/16: NG tube placed  Central access: Triple lumen PICC TPN start date: 9/19  Nutritional Goals:  per RD Assessment (08/28/21):  Kcal: 1800 - 2000 Protein: 85 - 95 g Fluid: 2 L/day  Current TPN formulation at goal rate of 80 mL/hr provides 92 g protein, 1886 kcals, and 288 g dextrose.   Current Nutrition:  NPO, TPN  Plan:  At 1800:  Continue TPN at goal rate 80 mL/hr  Electrolytes in TPN: Increase Na; Decrease K, Mg Na - 110 mEq/L K - 20 mEq/L Ca - 5 mEq/L Mg - 5 mEq/L Phos - 15 mmol/L Cl:Ac  ratio 1:1 Add standard MVI and trace elements to TPN Continue Moderate q8h SSI and adjust as needed  No current MIVF; further orders per MD Monitor TPN labs on Mon/Thurs. Recheck electrolytes with AM labs tomorrow.  Lenis Noon, PharmD 09/01/21 9:06 AM

## 2021-09-01 NOTE — Progress Notes (Signed)
PROGRESS NOTE   Hunter Ward  ERX:[id number]    DOB: [birth year]    DOA: 08/24/2021  PCP: Gaynelle Arabian, MD   I have briefly reviewed patients previous medical records in [facility].  Chief Complaint  Patient presents with   Fever   Emesis   Abdominal Pain    Brief Narrative:   Hunter Ward is a 70 year old male with PMH pancreatitis, HTN, hx CCY (~30 yrs ago), deafness (uses ASL) who presented to the hospital with worsening abdomial pain and inability to keep any food down the past 3 days. His last meal kept down was on 08/22/21.   He was recently hospitalized from 08/05/21 to 08/12/21 due to gallstone pancreatitis.  Imaging at that time showed a sizeable gallbladder remnant in the GB fossa with evidence of severe acute pancreatitis due to small retained gallstones within the fossa; MRCP also showed choledocholithiasis with 2 stones within the CBD. He then underwent ERCP on 08/07/2021 removing 2 stones, sphincterotomy performed, and placement of a plastic stent in the ventral pancreatic duct. Plan at discharge was for patient to follow-up at [facility] with advanced GI and surgery for definitive stone removal from gallbladder remnant.   He was discharged on 08/12/2021 and due to minimal recovery at home with recurrence and ongoing abdominal pain with inability to tolerate nutrition, he has presented back for further evaluation.   On admission this hospitalization, he underwent repeat CT abdomen/pelvis.  This showed progressive necrotizing pancreatitis with concern for phlegmon or abscess development measuring 8 x 5.2 cm.  There was also suspected splenic vein thrombosis on CT for which he was started on heparin drip for.  Persistent gallstones were noted in the gallbladder remnant and stable common hepatic duct dilation. Liver Doppler was then performed which showed patent portal and splenic veins, therefore heparin drip was discontinued at this time.     Assessment & Plan:  Principal  Problem:   Necrotizing pancreatitis Active Problems:   HTN (hypertension)   Normocytic anemia   Hyponatremia   Mild protein malnutrition (HCC)   Deafness   Necrotizing pancreatitis - per CT A/P on 9/15: "Progressive necrotizing pancreatitis as above, with 8.0 x 5.2 cm suspected abscess replacing the pancreatic head. Multilocular fluid collections along the pancreatic body may reflect further necrotic changes versus organizing pseudocysts. There is only minimal normal enhancing pancreatic tissue identified." - unable to be drained/approachable via IR; recommended conservative management at this time; possibly can re-image in a few days if no significant improvement or any worsening - continue zosyn for now - GI also following, appreciate assistance  - continue NPO, IVF, pain/nausea control and now on TNA via PICC line. - splenic vein thrombosis ruled out, d/c'd heparin drip  -NG tube initially placed but not used because it had not fully passed pylorus due to significant duodenal stricture/compression from pancreatic abscess.  Patient was treated with bowel rest, TPN via PICC line. - 9/22, upper GI series was without gastric outlet obstruction.  RN attempted to advance NG tube without success and NG tube eventually fell out.  IR planning to place back NG tube on 9/23.  Once in place, to start tube feeds. -As per GI, necrotizing pancreatitis with pseudocyst and enteral tube that cannot be advanced into the duodenum due to pancreatitis.   HTN (hypertension) - BP currently normal. - Hold off on any antihypertensive agents at this time   Deafness -Communicated using ASL interpretor.   Mild protein malnutrition (HCC) - Patient's BMI is  Body mass index is 22.74 kg/m.. - Patient has the following signs/symptoms consistent with PCM: (fat loss, muscle loss, muscle wasting). - check pre-albumin (<5 mg/dL) -Started TPN after PICC placed    Hyponatremia - Sodium 132 on admission - Presumed  hypovolemic hyponatremia from GI losses due to nausea/vomiting and poor oral intake -Mild and stable.  Hypophosphatemia - Being replaced and monitored by pharmacy   Normocytic anemia - baseline Hgb was ~14 g/dL in August however has now downtrended to ~10 g/dL; no overt bleeding reported; likely due to acute and prolonged illness -Hemoglobin stable in the 10 g range.   Constipation - Dulcolax suppository & PRN.  No BM documented for 9/22   Body mass index is 24.42 kg/m.  Nutritional Status Nutrition Problem: Increased nutrient needs Etiology: acute illness Signs/Symptoms: estimated needs Interventions: Ensure Surgery, MVI  Pressure Ulcer:     DVT prophylaxis: enoxaparin (LOVENOX) injection 40 mg Start: 08/26/21 1000     Code Status: Full Code Family Communication: None at bedside. Disposition:  Status is: Inpatient  Remains inpatient appropriate because:Inpatient level of care appropriate due to severity of illness  Dispo: The patient is from: Home              Anticipated d/c is to: Home              Patient currently is not medically stable to d/c.   Difficult to place patient No        Consultants:   [facility]  Procedures:   Core track Right upper extremity PICC line with TNA  Antimicrobials:    Anti-infectives (From admission, onward)    Start     Dose/Rate Route Frequency Ordered Stop   08/25/21 0200  piperacillin-tazobactam (ZOSYN) IVPB 3.375 g        3.375 g 12.5 mL/hr over 240 Minutes Intravenous Every 8 hours 08/24/21 2009     08/24/21 2200  piperacillin-tazobactam (ZOSYN) IVPB 3.375 g  Status:  Discontinued        3.375 g 100 mL/hr over 30 Minutes Intravenous Every 8 hours 08/24/21 1931 08/24/21 2009   08/24/21 1915  piperacillin-tazobactam (ZOSYN) IVPB 3.375 g        3.375 g 100 mL/hr over 30 Minutes Intravenous  Once 08/24/21 1911 08/24/21 2030         Subjective:  Patient interviewed and examined using ASL video interpreter.  Reports  epigastric/upper abdominal pain 3/10.  Asking if he will get sedation for NG tube placement.  He denies having a BM but BM is recorded in chart.  Objective:   Vitals:   08/31/21 1225 08/31/21 2227 09/01/21 0432 09/01/21 1219  BP: 109/72 108/65 108/64 101/71  Pulse: 73 75 67 75  Resp: 18 20 18 16   Temp: 98 F (36.7 C) 98.9 F (37.2 C) 98.2 F (36.8 C) 98.3 F (36.8 C)  TempSrc: Oral Oral Oral Oral  SpO2: 95% 95% 98% 97%  Weight:      Height:        General exam: Elderly male, moderately built and nourished, lying comfortably supine in bed. Respiratory system: Clear to auscultation.  No increased work of breathing. Cardiovascular system: S1 and S2 heard, RRR.  No JVD or pedal edema.  Gastrointestinal system: Abdomen is nondistended, soft and nontender.  Normal bowel sounds heard.  No NG tube when seen this morning. Central nervous system: Alert and oriented. No focal neurological deficits. Extremities: Symmetric 5 x 5 power. Skin: No rashes, lesions or ulcers Psychiatry:  Judgement and insight appear normal. Mood & affect appropriate.     Data Reviewed:   I have personally reviewed following labs and imaging studies   CBC: Recent Labs  Lab 08/27/21 0511 08/28/21 0425 08/29/21 0326  WBC 12.2* 11.9* 9.2  NEUTROABS 10.4* 10.0* 7.4  HGB 10.3* 10.4* 10.2*  HCT 31.3* 31.8* 30.8*  MCV 98.1 98.5 98.1  PLT 179 177 035    Basic Metabolic Panel: Recent Labs  Lab 08/30/21 0459 08/31/21 0444 09/01/21 0435  NA 133* 132* 133*  K 4.0 4.3 4.7  CL 99 103 102  CO2 27 25 25   GLUCOSE 141* 140* 127*  BUN 10 10 12   CREATININE 0.55* 0.59* 0.54*  CALCIUM 7.6* 8.1* 8.1*  MG 2.0 1.9 2.3  PHOS 2.7 3.1 3.0    Liver Function Tests: Recent Labs  Lab 08/29/21 0326 08/30/21 0459 08/31/21 0444  AST 17 17 20   ALT 13 12 13   ALKPHOS 45 47 48  BILITOT 0.8 0.6 0.4  PROT 5.2* 5.2* 5.4*  ALBUMIN 2.1* 2.0* 2.0*    CBG: Recent Labs  Lab 08/31/21 2119 09/01/21 0534  09/01/21 1404  GLUCAP 124* 126* 122*    Microbiology Studies:   Recent Results (from the past 240 hour(s))  Resp Panel by RT-PCR (Flu A&B, Covid) Nasopharyngeal Swab     Status: None   Collection Time: 08/24/21  1:37 PM   Specimen: Nasopharyngeal Swab; Nasopharyngeal(NP) swabs in vial transport medium  Result Value Ref Range Status   SARS Coronavirus 2 by RT PCR NEGATIVE NEGATIVE Final    Comment: (NOTE) SARS-CoV-2 target nucleic acids are NOT DETECTED.  The SARS-CoV-2 RNA is generally detectable in upper respiratory specimens during the acute phase of infection. The lowest concentration of SARS-CoV-2 viral copies this assay can detect is 138 copies/mL. A negative result does not preclude SARS-Cov-2 infection and should not be used as the sole basis for treatment or other patient management decisions. A negative result may occur with  improper specimen collection/handling, submission of specimen other than nasopharyngeal swab, presence of viral mutation(s) within the areas targeted by this assay, and inadequate number of viral copies(<138 copies/mL). A negative result must be combined with clinical observations, patient history, and epidemiological information. The expected result is Negative.  Fact Sheet for Patients:  EntrepreneurPulse.com.au  Fact Sheet for Healthcare Providers:  IncredibleEmployment.be  This test is no t yet approved or cleared by the [place] FDA and  has been authorized for detection and/or diagnosis of SARS-CoV-2 by FDA under an Emergency Use Authorization (EUA). This EUA will remain  in effect (meaning this test can be used) for the duration of the COVID-19 declaration under Section 564(b)(1) of the Act, 21 U.S.C.section 360bbb-3(b)(1), unless the authorization is terminated  or revoked sooner.       Influenza A by PCR NEGATIVE NEGATIVE Final   Influenza B by PCR NEGATIVE NEGATIVE Final    Comment:  (NOTE) The Xpert Xpress SARS-CoV-2/FLU/RSV plus assay is intended as an aid in the diagnosis of influenza from Nasopharyngeal swab specimens and should not be used as a sole basis for treatment. Nasal washings and aspirates are unacceptable for Xpert Xpress SARS-CoV-2/FLU/RSV testing.  Fact Sheet for Patients: EntrepreneurPulse.com.au  Fact Sheet for Healthcare Providers: IncredibleEmployment.be  This test is not yet approved or cleared by the [place] FDA and has been authorized for detection and/or diagnosis of SARS-CoV-2 by FDA under an Emergency Use Authorization (EUA). This EUA will remain in effect (meaning this test can  be used) for the duration of the COVID-19 declaration under Section 564(b)(1) of the Act, 21 U.S.C. section 360bbb-3(b)(1), unless the authorization is terminated or revoked.  Performed at [facility], [place] [address] [place], [place] [address]      Radiology Studies:  DG UGI W SINGLE CM (SOL OR THIN BA)  Result Date: 08/31/2021 CLINICAL DATA:  70 year old male with possible gastric outlet obstruction clinically suspected. EXAM: WATER SOLUBLE UPPER GI SERIES TECHNIQUE: Single-column upper GI series was performed using water soluble contrast. CONTRAST:  161mL OMNIPAQUE IOHEXOL 300 MG/ML  SOLN COMPARISON:  No priors. FLUOROSCOPY TIME:  Fluoroscopy Time:  1 minute and 45 seconds Radiation Exposure Index (if provided by the fluoroscopic device): 18.4 mGy FINDINGS: Multiple fluoroscopic images were obtained following ingestion of 150 mL of Omnipaque 300. These demonstrated nonspecific esophageal motility disorder with intermittent failure to fully propagate primary peristaltic waves. Esophagus was otherwise structurally normal in appearance. Limited single contrast imaging of the stomach demonstrated no definite gastric mass or mucosal abnormality within the limitations of today's examination. Patient was  placed in an RPO position, shortly thereafter, contrast was noted to traverse the pylorus into the duodenum. IMPRESSION: 1. No evidence of gastric outlet obstruction. Electronically Signed   By: Vinnie Langton M.D.   On: 08/31/2021 10:53     Scheduled Meds:    Chlorhexidine Gluconate Cloth  6 each Topical Daily   enoxaparin (LOVENOX) injection  40 mg Subcutaneous Daily   insulin aspart  0-15 Units Subcutaneous Q8H   pantoprazole (PROTONIX) IV  40 mg Intravenous Q24H    Continuous Infusions:    piperacillin-tazobactam (ZOSYN)  IV 3.375 g (09/01/21 1203)   TPN ADULT (ION) 80 mL/hr at 09/01/21 0344   TPN ADULT (ION)       LOS: 7 days     Vernell Leep, MD, [place], [facility]. [facility]    To contact the attending provider between 7A-7P or the covering provider during after hours 7P-7A, please log into the web site [email or web address] and access using universal  password for that web site. If you do not have the password, please call the hospital operator.  09/01/2021, 2:54 PM

## 2021-09-01 NOTE — Progress Notes (Signed)
Pt assisted up to [facility]. Pt had a large amount of liquid stool with a small amount of soft stool mixed in, Color brown.

## 2021-09-01 NOTE — Progress Notes (Signed)
[facility] Gastroenterology Progress Note  Hunter Ward 70 y.o. [birth year]  CC:  Necrotizing pancreatitis   Subjective: Patient reports improvement in nausea, no vomiting. Mild epigastric abdominal pain. Hasn't had a BM yet.  [employer] video interpreting services utilized for this encounter Antony Haste [phone])  ROS : Review of Systems  Cardiovascular:  Negative for chest pain and palpitations.  Gastrointestinal:  Positive for abdominal pain (epigastric) and constipation. Negative for blood in stool, diarrhea, heartburn, melena, nausea and vomiting.     Objective: Vital signs in last 24 hours: Vitals:   08/31/21 2227 09/01/21 0432  BP: 108/65 108/64  Pulse: 75 67  Resp: 20 18  Temp: 98.9 F (37.2 C) 98.2 F (36.8 C)  SpO2: 95% 98%    Physical Exam:  General:  Alert, cooperative, no distress, frail elderly male  Head:  Normocephalic, without obvious abnormality, atraumatic  Eyes:  Anicteric sclera, EOM's intact  Lungs:   Clear to auscultation bilaterally, respirations unlabored  Heart:  Regular rate and rhythm, S1, S2 normal  Abdomen:   Soft with upper abdominal tenderness to palpation, sluggish but present bowel sounds, no peritoneal signs    Lab Results: Recent Labs    08/31/21 0444 09/01/21 0435  NA 132* 133*  K 4.3 4.7  CL 103 102  CO2 25 25  GLUCOSE 140* 127*  BUN 10 12  CREATININE 0.59* 0.54*  CALCIUM 8.1* 8.1*  MG 1.9 2.3  PHOS 3.1 3.0   Recent Labs    08/30/21 0459 08/31/21 0444  AST 17 20  ALT 12 13  ALKPHOS 47 48  BILITOT 0.6 0.4  PROT 5.2* 5.4*  ALBUMIN 2.0* 2.0*   No results for input(s): WBC, NEUTROABS, HGB, HCT, MCV, PLT in the last 72 hours. No results for input(s): LABPROT, INR in the last 72 hours.    Assessment Necrotizing pancreatitis with suspected 8 cm abscess replacing the pancreatic head, as well as additional necrotic changes vs. organizing pseudocysts -WBCs 9.2 as of 9/20 -Normal renal function: BUN 12/ Cr 0.54 -Unable to advance  feeding tube 9/17 due to mass-effect in second portion of duodenum.  No contrast passed into distal aspect of the second portion of the duodenum. -Repeat UGI 9/22 was without evidence of gastric outlet obstruction, contrast traversed the pylorus and went into the duodenum   Constipation   Hypoalbuminemia (2.0)   Plan: NG tube initially planned to be advanced by nursing with hope that peristalsis will advance into the duodenum. However, per nursing the NG tube completely fell out. IR planning to place back NG tube today.  Once feeding tube is advanced into jejunum, recommend starting tube feeds.   Once feeding tube is advanced, we can also start Miralax daily per tube.   [facility] will follow.  Garnette Scheuermann PA-C 09/01/2021, 10:38 AM  Contact #  [phone]

## 2021-09-02 ENCOUNTER — Inpatient Hospital Stay (HOSPITAL_COMMUNITY): Payer: Medicare Other

## 2021-09-02 DIAGNOSIS — K8591 Acute pancreatitis with uninfected necrosis, unspecified: Secondary | ICD-10-CM | POA: Diagnosis not present

## 2021-09-02 LAB — BASIC METABOLIC PANEL
Anion gap: 6 (ref 5–15)
BUN: 13 mg/dL (ref 8–23)
CO2: 24 mmol/L (ref 22–32)
Calcium: 8.5 mg/dL — ABNORMAL LOW (ref 8.9–10.3)
Chloride: 100 mmol/L (ref 98–111)
Creatinine, Ser: 0.57 mg/dL — ABNORMAL LOW (ref 0.61–1.24)
GFR, Estimated: >60 mL/min (ref >60)
Glucose, Bld: 137 mg/dL — ABNORMAL HIGH (ref 70–99)
Potassium: 4.4 mmol/L (ref 3.5–5.1)
Sodium: 130 mmol/L — ABNORMAL LOW (ref 135–145)

## 2021-09-02 LAB — GLUCOSE, CAPILLARY
Glucose-Capillary: 138 mg/dL — ABNORMAL HIGH (ref 70–99)
Glucose-Capillary: 124 mg/dL — ABNORMAL HIGH (ref 70–99)

## 2021-09-02 LAB — MAGNESIUM: Magnesium: 2 mg/dL (ref 1.7–2.4)

## 2021-09-02 LAB — PHOSPHORUS: Phosphorus: 3.5 mg/dL (ref 2.5–4.6)

## 2021-09-02 MED ORDER — INSULIN ASPART 100 UNIT/ML IJ SOLN
0.0000 [IU] | Freq: Two times a day (BID) | INTRAMUSCULAR | Status: DC
  Administered 2021-09-02 – 2021-09-03 (×3): 2 [IU] via SUBCUTANEOUS
  Administered 2021-09-04: 3 [IU] via SUBCUTANEOUS
  Administered 2021-09-04 – 2021-09-05 (×3): 2 [IU] via SUBCUTANEOUS

## 2021-09-02 MED ORDER — TRAVASOL 10 % IV SOLN
INTRAVENOUS | Status: AC
  Filled 2021-09-02: qty 921.6

## 2021-09-02 MED ORDER — IOHEXOL 300 MG/ML  SOLN
30.0000 mL | Freq: Once | INTRAMUSCULAR | Status: AC | PRN
  Administered 2021-09-02: 30 mL

## 2021-09-02 NOTE — Progress Notes (Signed)
PHARMACY - TOTAL PARENTERAL NUTRITION CONSULT NOTE   Indication:  NPO for necrotizing pancreatitis  Patient Measurements: Height: 5' 10" (177.8 cm) Weight: 77.2 kg (170 lb 3.1 oz) IBW/kg (Calculated) : 73 TPN AdjBW (KG): 71.9 Body mass index is 24.42 kg/m. Usual Weight: 80 kg  Assessment: 70 yoM re-admitted for necrotizing pancreatitis and is NPO.  Initially planning for post-pyloric tube feeds, but unable to advance NGT d/t significant duodenal stricture from pancreatic abscess and surrounding edema. Pharmacy consulted to manage TPN.  Glucose / Insulin: No hx DM. CBG goal <180 -BG range: 122 - 138. mSSI q8h; 4 units required/24 hrs -Documented that patient is refusing some CBG checks Electrolytes: Na (130) remains low and decreased despite increasing Na concentration of TPN. All other lytes are WNL, including CorrCa (10.1) Renal: SCr, BUN, bicarb stable & WNL Hepatic: LFTs, T.bili, Alk Phos and TG all WNL I/O: Strict I/O not measured -UOP 2150 mL; stool x1 unmeasured GI Imaging: - 9/15 CTa/p: progressive necrotizing pancreatitis w/ 8 cm suspected abscess at pancreatic head, multiple other loculated collections along pancreatic body; common hepatic duct dilation w/ persistent gallstones in GB. - 9/17 UGI: minimal passage of contrast past duodenal stricture, likely d/t mass effect from pancreatic abscess/edema GI Surgeries / Procedures:  - 8/29: Pancreatic stent placed - 9/16: NG tube placed  Central access: Triple lumen PICC TPN start date: 9/19  Nutritional Goals:  per RD Assessment (08/28/21):  Kcal: 1800 - 2000 Protein: 85 - 95 g Fluid: 2 L/day  Current TPN formulation at goal rate of 80 mL/hr provides 92 g protein, 1886 kcals, and 288 g dextrose.   Current Nutrition:  NPO, TPN  Planning to start TF once NGT advanced  Plan:  Now: BG controlled at goal, no hx DM. Decrease mSSI frequency from q8h to q12h  At 1800:  Continue TPN at goal rate 80 mL/hr  Electrolytes in  TPN: Increase Na; Decrease Ca Na - 150 mEq/L K - 20 mEq/L Ca - 3 mEq/L Mg - 5 mEq/L Phos - 15 mmol/L Cl:Ac ratio 1:1 Add standard MVI and trace elements to TPN Continue Moderate q12h SSI and adjust as needed  No current MIVF; further orders per MD Monitor TPN labs on Mon/Thurs. Recheck electrolytes with AM labs tomorrow.  Mary M Swayne, PharmD 09/02/21 9:08 AM  

## 2021-09-02 NOTE — Progress Notes (Signed)
[facility] Gastroenterology Progress Note  Hunter Ward 70 y.o. 07/10/1951   Subjective: Complaining of LUQ pain. Two loose nonbloody stools today. Family in room. AWN machine used to translate (sign language).  Objective: Vital signs: Vitals:   09/02/21 0618 09/02/21 1246  BP: 107/68 119/71  Pulse: 75 69  Resp: 18 16  Temp: 97.9 F (36.6 C) 98.4 F (36.9 C)  SpO2: 97% 98%    Physical Exam: Gen: eldery, thin, alert, no acute distress  HEENT: anicteric sclera CV: RRR Chest: CTA B Abd: upper quadrant tenderness (greatest in LUQ) without guarding, soft, nondistended, +BS Ext: no edema  Lab Results: Recent Labs    09/01/21 0435 09/02/21 0407  NA 133* 130*  K 4.7 4.4  CL 102 100  CO2 25 24  GLUCOSE 127* 137*  BUN 12 13  CREATININE 0.54* 0.57*  CALCIUM 8.1* 8.5*  MG 2.3 2.0  PHOS 3.0 3.5   Recent Labs    08/31/21 0444  AST 20  ALT 13  ALKPHOS 48  BILITOT 0.4  PROT 5.4*  ALBUMIN 2.0*   No results for input(s): WBC, NEUTROABS, HGB, HCT, MCV, PLT in the last 72 hours.    Assessment/Plan: Necrotizing pancreatitis on TPN - Nasoenteric tube unable to be advanced into jejunum by IR and tip has moved back into the stomach likely due to ongoing pancreatic inflammation. Will repeat abd CT tomorrow to reassess necrotizing pancreatitis and pancreatic abscess/pseudocyst. If necrotizing pancreatitis persisting then will d/c nasoenteric tube since will not be able to use it anytime soon. Continue TPN. Continue supportive care.    Hunter Ward 09/02/2021, 3:58 PM  Questions please call [phone] Patient ID: Hunter Ward, male   DOB: [birth year], 70 y.o.   MRN: [id number]

## 2021-09-02 NOTE — Progress Notes (Signed)
PROGRESS NOTE   Hunter Ward    Hunter Ward    DOA: 08/24/2021  PCP: Gaynelle Arabian, MD   I have briefly reviewed patients previous medical records in [facility].  Chief Complaint  Patient presents with   Fever   Emesis   Abdominal Pain    Brief Narrative:   Hunter Ward is a 70 year old male with PMH pancreatitis, HTN, hx CCY (~30 yrs ago), deafness (uses ASL) who presented to the hospital with worsening abdomial pain and inability to keep any food for 3 days PTA.   He was recently hospitalized from 08/05/21 to 08/12/21 due to gallstone pancreatitis.  Imaging at that time showed a sizeable gallbladder remnant in the GB fossa with evidence of severe acute pancreatitis due to small retained gallstones within the fossa; MRCP also showed choledocholithiasis with 2 stones within the CBD. He then underwent ERCP on 08/07/2021 removing 2 stones, sphincterotomy performed, and placement of a plastic stent in the ventral pancreatic duct. Plan at discharge was for patient to follow-up at [facility] with advanced GI and surgery for definitive stone removal from gallbladder remnant.   He was discharged on 08/12/2021 and due to minimal recovery at home with recurrence and ongoing abdominal pain with inability to tolerate nutrition, he has presented back for further evaluation.   On admission this hospitalization, he underwent repeat CT abdomen/pelvis.  This showed progressive necrotizing pancreatitis with concern for phlegmon or abscess development measuring 8 x 5.2 cm.  There was also suspected splenic vein thrombosis on CT for which he was started on heparin drip for.  Persistent gallstones were noted in the gallbladder remnant and stable common hepatic duct dilation. Liver Doppler was then performed which showed patent portal and splenic veins, therefore heparin drip was discontinued at this time.     Assessment & Plan:  Principal Problem:   Necrotizing pancreatitis Active  Problems:   HTN (hypertension)   Normocytic anemia   Hyponatremia   Mild protein malnutrition (HCC)   Deafness   Necrotizing pancreatitis - per CT A/P on 9/15: "Progressive necrotizing pancreatitis as above, with 8.0 x 5.2 cm suspected abscess replacing the pancreatic head. Multilocular fluid collections along the pancreatic body may reflect further necrotic changes versus organizing pseudocysts. There is only minimal normal enhancing pancreatic tissue identified." - unable to be drained/approachable via IR; recommended conservative management at this time; possibly can re-image in a few days if no significant improvement or any worsening - continue zosyn for now - GI also following, appreciate assistance  - continue NPO, IVF, pain/nausea control and now on TNA via PICC line. - splenic vein thrombosis ruled out, d/c'd heparin drip  -NG tube initially placed but not used because it had not fully passed pylorus due to significant duodenal stricture/compression from pancreatic abscess.  Patient was treated with bowel rest, TPN via PICC line. - 9/22, upper GI series was without gastric outlet obstruction.  It appears that IR placed feeding tube to the level of the proximal descending duodenum.  However repeat abdominal x-ray today shows distal tip in the gastric antrum.  Would have to be advanced-Per nursing, radiology had a difficult time in placing this yesterday and may have to advance it tomorrow. -As per GI, necrotizing pancreatitis with pseudocyst and enteral tube that cannot be advanced into the duodenum due to pancreatitis.   Essential hypertension - BP currently normal. - Hold off on any antihypertensive agents at this time   Deafness -Communicated using ASL interpretor.  Hyponatremia -Mild and stable.  Clinically euvolemic.?  Etiology,?  SIADH.  Hypophosphatemia - Being replaced and monitored by pharmacy   Normocytic anemia - baseline Hgb was ~14 g/dL in August however has now  downtrended to ~10 g/dL; no overt bleeding reported; likely due to acute and prolonged illness -Hemoglobin stable in the 10 g range.   Constipation - Dulcolax suppository & PRN.  Had a large BM on 9/23.   Body mass index is 24.42 kg/m.  Nutritional Status Nutrition Problem: Increased nutrient needs Etiology: acute illness Signs/Symptoms: estimated needs Interventions: Ensure Surgery, MVI  Pressure Ulcer:     DVT prophylaxis: enoxaparin (LOVENOX) injection 40 mg Start: 08/26/21 1000     Code Status: Full Code Family Communication: None at bedside. Disposition:  Status is: Inpatient  Remains inpatient appropriate because:Inpatient level of care appropriate due to severity of illness  Dispo: The patient is from: Home              Anticipated d/c is to: Home              Patient currently is not medically stable to d/c.   Difficult to place patient No        Consultants:   [facility]  Procedures:   Core track Right upper extremity PICC line with TNA  Antimicrobials:    Anti-infectives (From admission, onward)    Start     Dose/Rate Route Frequency Ordered Stop   08/25/21 0200  piperacillin-tazobactam (ZOSYN) IVPB 3.375 g        3.375 g 12.5 mL/hr over 240 Minutes Intravenous Every 8 hours 08/24/21 2009     08/24/21 2200  piperacillin-tazobactam (ZOSYN) IVPB 3.375 g  Status:  Discontinued        3.375 g 100 mL/hr over 30 Minutes Intravenous Every 8 hours 08/24/21 1931 08/24/21 2009   08/24/21 1915  piperacillin-tazobactam (ZOSYN) IVPB 3.375 g        3.375 g 100 mL/hr over 30 Minutes Intravenous  Once 08/24/21 1911 08/24/21 2030         Subjective:  Patient interviewed and examined using ASL video interpreter.  Had a "blowout" BM yesterday.  Reports throat discomfort from NG tube.  Ongoing retrosternal and epigastric discomfort and feels that this is due to tube.  Objective:   Vitals:   09/01/21 1219 09/01/21 2037 09/02/21 0618 09/02/21 1246  BP: 101/71  111/75 107/68 119/71  Pulse: 75 82 75 69  Resp: 16 18 18 16   Temp: 98.3 F (36.8 C) 98.6 F (37 C) 97.9 F (36.6 C) 98.4 F (36.9 C)  TempSrc: Oral Oral Oral Oral  SpO2: 97%  97% 98%  Weight:      Height:        General exam: Elderly male, moderately built and nourished, lying comfortably supine in bed. Respiratory system: Clear to auscultation.  No increased work of breathing. Cardiovascular system: S1 and S2 heard, RRR.  No JVD or pedal edema.  Gastrointestinal system: Abdomen is nondistended and soft.  Minimal epigastric tenderness without peritoneal signs.  Normal bowel sounds heard.  Core track in place. Central nervous system: Alert and oriented. No focal neurological deficits. Extremities: Symmetric 5 x 5 power. Skin: No rashes, lesions or ulcers Psychiatry: Judgement and insight appear normal. Mood & affect appropriate.     Data Reviewed:   I have personally reviewed following labs and imaging studies   CBC: Recent Labs  Lab 08/27/21 0511 08/28/21 0425 08/29/21 0326  WBC 12.2* 11.9* 9.2  NEUTROABS 10.4* 10.0* 7.4  HGB 10.3* 10.4* 10.2*  HCT 31.3* 31.8* 30.8*  MCV 98.1 98.5 98.1  PLT 179 177 562    Basic Metabolic Panel: Recent Labs  Lab 08/31/21 0444 09/01/21 0435 09/02/21 0407  NA 132* 133* 130*  K 4.3 4.7 4.4  CL 103 102 100  CO2 25 25 24   GLUCOSE 140* 127* 137*  BUN 10 12 13   CREATININE 0.59* 0.54* 0.57*  CALCIUM 8.1* 8.1* 8.5*  MG 1.9 2.3 2.0  PHOS 3.1 3.0 3.5    Liver Function Tests: Recent Labs  Lab 08/29/21 0326 08/30/21 0459 08/31/21 0444  AST 17 17 20   ALT 13 12 13   ALKPHOS 45 47 48  BILITOT 0.8 0.6 0.4  PROT 5.2* 5.2* 5.4*  ALBUMIN 2.1* 2.0* 2.0*    CBG: Recent Labs  Lab 09/01/21 0534 09/01/21 1404 09/02/21 0615  GLUCAP 126* 122* 138*    Microbiology Studies:   Recent Results (from the past 240 hour(s))  Resp Panel by RT-PCR (Flu A&B, Covid) Nasopharyngeal Swab     Status: None   Collection Time: 08/24/21  1:37  PM   Specimen: Nasopharyngeal Swab; Nasopharyngeal(NP) swabs in vial transport medium  Result Value Ref Range Status   SARS Coronavirus 2 by RT PCR NEGATIVE NEGATIVE Final    Comment: (NOTE) SARS-CoV-2 target nucleic acids are NOT DETECTED.  The SARS-CoV-2 RNA is generally detectable in upper respiratory specimens during the acute phase of infection. The lowest concentration of SARS-CoV-2 viral copies this assay can detect is 138 copies/mL. A negative result does not preclude SARS-Cov-2 infection and should not be used as the sole basis for treatment or other patient management decisions. A negative result may occur with  improper specimen collection/handling, submission of specimen other than nasopharyngeal swab, presence of viral mutation(s) within the areas targeted by this assay, and inadequate number of viral copies(<138 copies/mL). A negative result must be combined with clinical observations, patient history, and epidemiological information. The expected result is Negative.  Fact Sheet for Patients:  EntrepreneurPulse.com.au  Fact Sheet for Healthcare Providers:  IncredibleEmployment.be  This test is no t yet approved or cleared by the [place] FDA and  has been authorized for detection and/or diagnosis of SARS-CoV-2 by FDA under an Emergency Use Authorization (EUA). This EUA will remain  in effect (meaning this test can be used) for the duration of the COVID-19 declaration under Section 564(b)(1) of the Act, 21 U.S.C.section 360bbb-3(b)(1), unless the authorization is terminated  or revoked sooner.       Influenza A by PCR NEGATIVE NEGATIVE Final   Influenza B by PCR NEGATIVE NEGATIVE Final    Comment: (NOTE) The Xpert Xpress SARS-CoV-2/FLU/RSV plus assay is intended as an aid in the diagnosis of influenza from Nasopharyngeal swab specimens and should not be used as a sole basis for treatment. Nasal washings and aspirates are  unacceptable for Xpert Xpress SARS-CoV-2/FLU/RSV testing.  Fact Sheet for Patients: EntrepreneurPulse.com.au  Fact Sheet for Healthcare Providers: IncredibleEmployment.be  This test is not yet approved or cleared by the [place] FDA and has been authorized for detection and/or diagnosis of SARS-CoV-2 by FDA under an Emergency Use Authorization (EUA). This EUA will remain in effect (meaning this test can be used) for the duration of the COVID-19 declaration under Section 564(b)(1) of the Act, 21 U.S.C. section 360bbb-3(b)(1), unless the authorization is terminated or revoked.  Performed at [facility], [place] [address] [place], [place] [address]  Radiology Studies:  DG Abd 1 View  Result Date: 09/02/2021 CLINICAL DATA:  70 year old male status post enteric feeding tube placement. EXAM: ABDOMEN - 1 VIEW COMPARISON:  09/01/2021 FINDINGS: Weighted tip feeding tube distal tip is within the gastric antrum. Residual enteric contrast material is visualized within the gastric fundus and colon. No evidence of bowel distention. Pancreatic duct stent remains in place. IMPRESSION: Weighted tip feeding tube distal tip is positioned in the gastric antrum. Electronically Signed   By: Ruthann Cancer M.D.   On: 09/02/2021 12:08   DG INTRO LONG GI TUBE  Result Date: 09/01/2021 CLINICAL DATA:  Pancreatitis.  Unable to pass feeding tube. EXAM: FL FEEDING TUBE PLACEMENT CONTRAST:  50 cc of Omnipaque 300 FLUOROSCOPY TIME:  Fluoroscopy Time:   3 minutes and 42 seconds Radiation Exposure Index (if provided by the fluoroscopic device): Not applicable. Number of Acquired Spot Images: 0 COMPARISON: Upper GI of 1 day prior.  CT of 08/24/2021 FINDINGS: Feeding tube was placed to level the stomach without difficulty. With maneuvering, the tube was placed to the proximal descending duodenum. Despite multiple maneuvers, the tube could not be further  advanced. Secondary to patient discomfort, decision was made to halt procedure with the tube tip in the proximal duodenum. When correlated with the CT of 08/24/2021, it is doubtful that the tube could be passed to the level of the duodenal jejunal junction. IMPRESSION: Feeding tube placed to the level of the proximal descending duodenum. Electronically Signed   By: Abigail Miyamoto M.D.   On: 09/01/2021 15:28     Scheduled Meds:    Chlorhexidine Gluconate Cloth  6 each Topical Daily   enoxaparin (LOVENOX) injection  40 mg Subcutaneous Daily   insulin aspart  0-15 Units Subcutaneous Q12H   pantoprazole (PROTONIX) IV  40 mg Intravenous Q24H    Continuous Infusions:    piperacillin-tazobactam (ZOSYN)  IV 3.375 g (09/02/21 1307)   TPN ADULT (ION) 80 mL/hr at 09/01/21 1751   TPN ADULT (ION)       LOS: 8 days     Vernell Leep, MD, [place], [facility]. [facility]    To contact the attending provider between 7A-7P or the covering provider during after hours 7P-7A, please log into the web site [email or web address] and access using universal [place] password for that web site. If you do not have the password, please call the hospital operator.  09/02/2021, 3:18 PM

## 2021-09-03 ENCOUNTER — Inpatient Hospital Stay (HOSPITAL_COMMUNITY): Payer: Medicare Other

## 2021-09-03 ENCOUNTER — Encounter (HOSPITAL_COMMUNITY): Payer: Self-pay | Admitting: Internal Medicine

## 2021-09-03 DIAGNOSIS — D649 Anemia, unspecified: Secondary | ICD-10-CM

## 2021-09-03 DIAGNOSIS — E871 Hypo-osmolality and hyponatremia: Secondary | ICD-10-CM | POA: Diagnosis not present

## 2021-09-03 DIAGNOSIS — K8591 Acute pancreatitis with uninfected necrosis, unspecified: Secondary | ICD-10-CM | POA: Diagnosis not present

## 2021-09-03 DIAGNOSIS — H9190 Unspecified hearing loss, unspecified ear: Secondary | ICD-10-CM

## 2021-09-03 DIAGNOSIS — R1310 Dysphagia, unspecified: Secondary | ICD-10-CM

## 2021-09-03 LAB — PHOSPHORUS: Phosphorus: 3.1 mg/dL (ref 2.5–4.6)

## 2021-09-03 LAB — MAGNESIUM: Magnesium: 2 mg/dL (ref 1.7–2.4)

## 2021-09-03 LAB — BASIC METABOLIC PANEL
Anion gap: 6 (ref 5–15)
BUN: 15 mg/dL (ref 8–23)
CO2: 26 mmol/L (ref 22–32)
Calcium: 8.1 mg/dL — ABNORMAL LOW (ref 8.9–10.3)
Chloride: 100 mmol/L (ref 98–111)
Creatinine, Ser: 0.57 mg/dL — ABNORMAL LOW (ref 0.61–1.24)
GFR, Estimated: >60 mL/min (ref >60)
Glucose, Bld: 139 mg/dL — ABNORMAL HIGH (ref 70–99)
Potassium: 4.1 mmol/L (ref 3.5–5.1)
Sodium: 132 mmol/L — ABNORMAL LOW (ref 135–145)

## 2021-09-03 LAB — GLUCOSE, CAPILLARY
Glucose-Capillary: 128 mg/dL — ABNORMAL HIGH (ref 70–99)
Glucose-Capillary: 122 mg/dL — ABNORMAL HIGH (ref 70–99)

## 2021-09-03 MED ORDER — DEXTROSE 10 % IV SOLN: INTRAVENOUS | Status: AC

## 2021-09-03 MED ORDER — IOHEXOL 350 MG/ML SOLN
80.0000 mL | Freq: Once | INTRAVENOUS | Status: AC | PRN
  Administered 2021-09-03: 80 mL via INTRAVENOUS

## 2021-09-03 MED ORDER — TRAVASOL 10 % IV SOLN
INTRAVENOUS | Status: DC
  Filled 2021-09-03: qty 921.6

## 2021-09-03 NOTE — Progress Notes (Signed)
Pharmacy: TPN interruption  CT disconnected TPN to use for contrast.  Unable to reconnect per infection control policy.  Plan: hang D10 at 80 ml/hr until next TPN hung 9/26 at [place], Pharm.D 09/03/2021 10:24 PM

## 2021-09-03 NOTE — Progress Notes (Signed)
Patient power port currently has TPN infusing.  CT needs power port for contrast.  Pt prefers not to have PIV started.  IV RN states she can change port that TPN is infusing into at 5pm when she hangs new TPN.  Dr. Michail Sermon is ok with CT being done after 5pm today; not urgent.  Dr. Michail Sermon, IV RN, patient, and CT tech aware. Will cont to monitor.

## 2021-09-03 NOTE — Progress Notes (Signed)
Patient sent down to CT scan. CT staff was instructed by me as well as the charge nurse that they could not disconnect TPN from pt. CT staff verbalized understanding.  Patient came back from CT scan and all lines were disconnected from the IV pump by CT staff.  Charge nurse, pharmacist, and MD Damita Dunnings notified.  Pt will be started on dextrose 10% at 80 ml/hr

## 2021-09-03 NOTE — Progress Notes (Signed)
PHARMACY - TOTAL PARENTERAL NUTRITION CONSULT NOTE   Indication:  NPO for necrotizing pancreatitis  Patient Measurements: Height: 5' 10" (177.8 cm) Weight: 77.2 kg (170 lb 3.1 oz) IBW/kg (Calculated) : 73 TPN AdjBW (KG): 71.9 Body mass index is 24.42 kg/m. Usual Weight: 80 kg  Assessment: 22 yoM re-admitted for necrotizing pancreatitis and is NPO.  Initially planning for post-pyloric tube feeds, but unable to advance NGT d/t significant duodenal stricture from pancreatic abscess and surrounding edema. Pharmacy consulted to manage TPN.  Glucose / Insulin: No hx DM. CBG goal <180 -BG range: 124 - 139 at goal. mSSI q12h; 4 units required/24 hrs Electrolytes: Na (132) remains low despite increasing Na concentration of TPN. All other lytes are WNL, including CorrCa (9.7) Renal: SCr, BUN, bicarb stable & WNL Hepatic: LFTs, T.bili, Alk Phos and TG all WNL I/O: Strict I/O not measured -UOP 650 mL + 2 unmeasured; stool x1 GI Imaging: - 9/15 CTa/p: progressive necrotizing pancreatitis w/ 8 cm suspected abscess at pancreatic head, multiple other loculated collections along pancreatic body; common hepatic duct dilation w/ persistent gallstones in GB. - 9/17 UGI: minimal passage of contrast past duodenal stricture, likely d/t mass effect from pancreatic abscess/edema -9/25: Repeating CT abdomen: GI Surgeries / Procedures:  - 8/29: Pancreatic stent placed - 9/16: NG tube placed - 9/23: NGT placed at proximal descending duodenum by IR  Central access: Triple lumen PICC TPN start date: 9/19  Nutritional Goals:  per RD Assessment (08/28/21):  Kcal: 1800 - 2000 Protein: 85 - 95 g Fluid: 2 L/day  Current TPN formulation at goal rate of 80 mL/hr provides 92 g protein, 1886 kcals, and 288 g dextrose.   Current Nutrition:  NPO, TPN  Difficulty placing NGT to be able to start TF. NGT was advanced by IR on 9/23, but is now back in stomach - likely due to ongoing pancreatic inflammation per GI.    Plan:   At 1800:  Continue TPN at goal rate 80 mL/hr  Electrolytes in TPN: Na at max concentration, unable to increase further. Increase K slightly Na - 150 mEq/L K - 25 mEq/L Ca - 3 mEq/L Mg - 5 mEq/L Phos - 15 mmol/L Cl:Ac ratio 1:1 Add standard MVI and trace elements to TPN Continue Moderate q12h SSI and adjust as needed  No current MIVF; further orders per MD Monitor TPN labs on Mon/Thurs  Lenis Noon, PharmD 09/03/21 8:57 AM

## 2021-09-03 NOTE — Progress Notes (Signed)
PROGRESS NOTE    Hunter Ward   KPV:[id number]  DOB: 05-03-1951  DOA: 08/24/2021 PCP: Gaynelle Arabian, MD   Brief Narrative:  Hunter Ward is a 70 year old male with PMH pancreatitis, HTN, hx CCY (~30 yrs ago), deafness (uses ASL) who presented to the hospital with worsening abdomial pain and inability to keep any food for 3 days PTA.   He was recently hospitalized from 08/05/21 to 08/12/21 due to gallstone pancreatitis.  Imaging at that time showed a sizeable gallbladder remnant in the GB fossa with evidence of severe acute pancreatitis due to small retained gallstones within the fossa; MRCP also showed choledocholithiasis with 2 stones within the CBD. He then underwent ERCP on 08/07/2021 removing 2 stones, sphincterotomy performed, and placement of a plastic stent in the ventral pancreatic duct. Plan at discharge was for patient to follow-up at [facility] with advanced GI and surgery for definitive stone removal from gallbladder remnant.   He was discharged on 08/12/2021 and due to minimal recovery at home with recurrence and ongoing abdominal pain with inability to tolerate nutrition, he has presented back for further evaluation.   On admission this hospitalization, he underwent repeat CT abdomen/pelvis.  This showed progressive necrotizing pancreatitis with concern for phlegmon or abscess development measuring 8 x 5.2 cm.  There was also suspected splenic vein thrombosis on CT for which he was started on heparin drip for.  Persistent gallstones were noted in the gallbladder remnant and stable common hepatic duct dilation. Liver Doppler was then performed which showed patent portal and splenic veins, therefore heparin drip was discontinued at this time.     Subjective: No new complaints    Assessment & Plan:   Principal Problem:   Necrotizing pancreatitis - - per CT A/P on 9/15: "Progressive necrotizing pancreatitis as above, with 8.0 x 5.2 cm suspected abscess replacing the pancreatic head.  Multilocular fluid collections along the pancreatic body may reflect further necrotic changes versus organizing pseudocysts. There is only minimal normal enhancing pancreatic tissue identified." - unable to be drained/approachable via IR; recommended conservative management at this time; possibly can re-image in a few days if no significant improvement or any worsening - continue zosyn for now - GI also following, appreciate assistance  - continue NPO, IVF, pain/nausea control and now on TNA via PICC line. - splenic vein thrombosis ruled out, d/c'd heparin drip  -NG tube initially placed but not used because it had not fully passed pylorus due to significant duodenal stricture/compression from pancreatic abscess.  Patient was treated with bowel rest, TPN via PICC line. - 9/22, upper GI series was without gastric outlet obstruction.  It appears that IR placed feeding tube to the level of the proximal descending duodenum.  However repeat abdominal x-ray today shows distal tip in the gastric antrum.  Would have to be advanced- -As per GI, necrotizing pancreatitis with pseudocyst and enteral tube that cannot be advanced into the duodenum due to pancreatitis. - f/u on CT abd/pelvis ordered by GI   Active Problems:   HTN (hypertension) -  BP currently low/ normal. - Hold off on any antihypertensive agents at this time    Normocytic anemia - baseline Hgb was ~14 g/dL in August however has now downtrended to ~10 g/dL; no overt bleeding reported; likely due to acute and prolonged illness -Hemoglobin stable in the 10 g range.    Hyponatremia -Mild and stable.  Clinically euvolemic.?  Etiology,?  SIADH.    Deafness - Communicate using ASL interpretor.  Constipation - Dulcolax suppository & PRN.  Had a large BM on 9/23.    Time spent in minutes: 35 DVT prophylaxis: enoxaparin (LOVENOX) injection 40 mg Start: 08/26/21 1000 Code Status: enoxaparin (LOVENOX) injection 40 mg Start: 08/26/21  1000 Family Communication:  Level of Care: Level of care: Med-Surg Disposition Plan:  Status is: Inpatient  Remains inpatient appropriate because:IV treatments appropriate due to intensity of illness or inability to take PO  Dispo: The patient is from: Home              Anticipated d/c is to: Home              Patient currently is not medically stable to d/c.   Difficult to place patient No      Consultants:  [facility] Procedures:  Cor Track PICC line RUE Antimicrobials:  Anti-infectives (From admission, onward)    Start     Dose/Rate Route Frequency Ordered Stop   08/25/21 0200  piperacillin-tazobactam (ZOSYN) IVPB 3.375 g        3.375 g 12.5 mL/hr over 240 Minutes Intravenous Every 8 hours 08/24/21 2009     08/24/21 2200  piperacillin-tazobactam (ZOSYN) IVPB 3.375 g  Status:  Discontinued        3.375 g 100 mL/hr over 30 Minutes Intravenous Every 8 hours 08/24/21 1931 08/24/21 2009   08/24/21 1915  piperacillin-tazobactam (ZOSYN) IVPB 3.375 g        3.375 g 100 mL/hr over 30 Minutes Intravenous  Once 08/24/21 1911 08/24/21 2030        Objective: Vitals:   09/02/21 1246 09/02/21 1932 09/03/21 0609 09/03/21 1345  BP: 119/71 105/66 103/63 107/62  Pulse: 69 69 66 64  Resp: 16 18 20 16   Temp: 98.4 F (36.9 C) 98.4 F (36.9 C) 98.2 F (36.8 C) 98 F (36.7 C)  TempSrc: Oral Oral Oral Oral  SpO2: 98% 97% 95% 100%  Weight:      Height:        Intake/Output Summary (Last 24 hours) at 09/03/2021 1346 Last data filed at 09/03/2021 1028 Gross per 24 hour  Intake 783.71 ml  Output 1125 ml  Net -341.29 ml   Filed Weights   08/25/21 0200 08/27/21 1529 08/28/21 0626  Weight: 71.9 kg 73.2 kg 77.2 kg    Examination: General exam: In no distress/ Appears comfortable  HEENT: PERRL, oral mucosa moist, no sclera icterus or thrush Respiratory system: Clear to auscultation. Respiratory effort normal. Cardiovascular system: S1 & S2 heard, RRR.   Gastrointestinal system:  Abdomen soft, non-tender, nondistended. Normal bowel sounds. Central nervous system: Alert and oriented. No focal neurological deficits. Extremities: No cyanosis, clubbing or edema Skin: No rashes or ulcers Psychiatry:  Mood & affect appropriate.     Data Reviewed: I have personally reviewed following labs and imaging studies  CBC: Recent Labs  Lab 08/28/21 0425 08/29/21 0326  WBC 11.9* 9.2  NEUTROABS 10.0* 7.4  HGB 10.4* 10.2*  HCT 31.8* 30.8*  MCV 98.5 98.1  PLT 177 009   Basic Metabolic Panel: Recent Labs  Lab 08/30/21 0459 08/31/21 0444 09/01/21 0435 09/02/21 0407 09/03/21 0335  NA 133* 132* 133* 130* 132*  K 4.0 4.3 4.7 4.4 4.1  CL 99 103 102 100 100  CO2 27 25 25 24 26   GLUCOSE 141* 140* 127* 137* 139*  BUN 10 10 12 13 15   CREATININE 0.55* 0.59* 0.54* 0.57* 0.57*  CALCIUM 7.6* 8.1* 8.1* 8.5* 8.1*  MG 2.0 1.9 2.3 2.0  2.0  PHOS 2.7 3.1 3.0 3.5 3.1   GFR: Estimated Creatinine Clearance: 87.4 mL/min (A) (by C-G formula based on SCr of 0.57 mg/dL (L)). Liver Function Tests: Recent Labs  Lab 08/28/21 0425 08/29/21 0326 08/30/21 0459 08/31/21 0444  AST 15 17 17 20   ALT 14 13 12 13   ALKPHOS 52 45 47 48  BILITOT 0.9 0.8 0.6 0.4  PROT 5.3* 5.2* 5.2* 5.4*  ALBUMIN 2.1* 2.1* 2.0* 2.0*   No results for input(s): LIPASE, AMYLASE in the last 168 hours. No results for input(s): AMMONIA in the last 168 hours. Coagulation Profile: No results for input(s): INR, PROTIME in the last 168 hours. Cardiac Enzymes: No results for input(s): CKTOTAL, CKMB, CKMBINDEX, TROPONINI in the last 168 hours. BNP (last 3 results) No results for input(s): PROBNP in the last 8760 hours. HbA1C: No results for input(s): HGBA1C in the last 72 hours. CBG: Recent Labs  Lab 09/01/21 0534 09/01/21 1404 09/02/21 0615 09/02/21 1928 09/03/21 0607  GLUCAP 126* 122* 138* 124* 128*   Lipid Profile: No results for input(s): CHOL, HDL, LDLCALC, TRIG, CHOLHDL, LDLDIRECT in the last 72  hours. Thyroid Function Tests: No results for input(s): TSH, T4TOTAL, FREET4, T3FREE, THYROIDAB in the last 72 hours. Anemia Panel: No results for input(s): VITAMINB12, FOLATE, FERRITIN, TIBC, IRON, RETICCTPCT in the last 72 hours. Urine analysis:    Component Value Date/Time   COLORURINE AMBER (A) 08/24/2021 1819   APPEARANCEUR TURBID (A) 08/24/2021 1819   LABSPEC 1.027 08/24/2021 1819   PHURINE 5.0 08/24/2021 1819   GLUCOSEU NEGATIVE 08/24/2021 1819   HGBUR NEGATIVE 08/24/2021 1819   BILIRUBINUR NEGATIVE 08/24/2021 1819   KETONESUR 5 (A) 08/24/2021 1819   PROTEINUR 30 (A) 08/24/2021 1819   NITRITE NEGATIVE 08/24/2021 1819   LEUKOCYTESUR NEGATIVE 08/24/2021 1819   Sepsis Labs: @LABRCNTIP (procalcitonin:4,lacticidven:4) )No results found for this or any previous visit (from the past 240 hour(s)).       Radiology Studies: DG Abd 1 View  Result Date: 09/02/2021 CLINICAL DATA:  70 year old male status post enteric feeding tube placement. EXAM: ABDOMEN - 1 VIEW COMPARISON:  09/01/2021 FINDINGS: Weighted tip feeding tube distal tip is within the gastric antrum. Residual enteric contrast material is visualized within the gastric fundus and colon. No evidence of bowel distention. Pancreatic duct stent remains in place. IMPRESSION: Weighted tip feeding tube distal tip is positioned in the gastric antrum. Electronically Signed   By: Ruthann Cancer M.D.   On: 09/02/2021 12:08   DG INTRO LONG GI TUBE  Result Date: 09/01/2021 CLINICAL DATA:  Pancreatitis.  Unable to pass feeding tube. EXAM: FL FEEDING TUBE PLACEMENT CONTRAST:  50 cc of Omnipaque 300 FLUOROSCOPY TIME:  Fluoroscopy Time:   3 minutes and 42 seconds Radiation Exposure Index (if provided by the fluoroscopic device): Not applicable. Number of Acquired Spot Images: 0 COMPARISON: Upper GI of 1 day prior.  CT of 08/24/2021 FINDINGS: Feeding tube was placed to level the stomach without difficulty. With maneuvering, the tube was placed to  the proximal descending duodenum. Despite multiple maneuvers, the tube could not be further advanced. Secondary to patient discomfort, decision was made to halt procedure with the tube tip in the proximal duodenum. When correlated with the CT of 08/24/2021, it is doubtful that the tube could be passed to the level of the duodenal jejunal junction. IMPRESSION: Feeding tube placed to the level of the proximal descending duodenum. Electronically Signed   By: Abigail Miyamoto M.D.   On: 09/01/2021 15:28  Scheduled Meds:  Chlorhexidine Gluconate Cloth  6 each Topical Daily   enoxaparin (LOVENOX) injection  40 mg Subcutaneous Daily   insulin aspart  0-15 Units Subcutaneous Q12H   pantoprazole (PROTONIX) IV  40 mg Intravenous Q24H   Continuous Infusions:  piperacillin-tazobactam (ZOSYN)  IV 3.375 g (09/03/21 1027)   TPN ADULT (ION) 80 mL/hr at 09/03/21 0215   TPN ADULT (ION)       LOS: 9 days      Debbe Odea, MD [facility] Pager: [email or web address] 09/03/2021, 1:46 PM

## 2021-09-04 DIAGNOSIS — K8591 Acute pancreatitis with uninfected necrosis, unspecified: Secondary | ICD-10-CM | POA: Diagnosis not present

## 2021-09-04 LAB — COMPREHENSIVE METABOLIC PANEL
ALT: 24 U/L (ref 0–44)
AST: 30 U/L (ref 15–41)
Albumin: 2.4 g/dL — ABNORMAL LOW (ref 3.5–5.0)
Alkaline Phosphatase: 50 U/L (ref 38–126)
Anion gap: 5 (ref 5–15)
BUN: 13 mg/dL (ref 8–23)
CO2: 27 mmol/L (ref 22–32)
Calcium: 8.6 mg/dL — ABNORMAL LOW (ref 8.9–10.3)
Chloride: 104 mmol/L (ref 98–111)
Creatinine, Ser: 0.63 mg/dL (ref 0.61–1.24)
GFR, Estimated: >60 mL/min (ref >60)
Glucose, Bld: 145 mg/dL — ABNORMAL HIGH (ref 70–99)
Potassium: 4 mmol/L (ref 3.5–5.1)
Sodium: 136 mmol/L (ref 135–145)
Total Bilirubin: 0.6 mg/dL (ref 0.3–1.2)
Total Protein: 6.2 g/dL — ABNORMAL LOW (ref 6.5–8.1)

## 2021-09-04 LAB — GLUCOSE, CAPILLARY
Glucose-Capillary: 158 mg/dL — ABNORMAL HIGH (ref 70–99)
Glucose-Capillary: 148 mg/dL — ABNORMAL HIGH (ref 70–99)
Glucose-Capillary: 146 mg/dL — ABNORMAL HIGH (ref 70–99)
Glucose-Capillary: 131 mg/dL — ABNORMAL HIGH (ref 70–99)
Glucose-Capillary: 148 mg/dL — ABNORMAL HIGH (ref 70–99)
Glucose-Capillary: 151 mg/dL — ABNORMAL HIGH (ref 70–99)
Glucose-Capillary: 157 mg/dL — ABNORMAL HIGH (ref 70–99)

## 2021-09-04 LAB — PHOSPHORUS: Phosphorus: 3.1 mg/dL (ref 2.5–4.6)

## 2021-09-04 LAB — MAGNESIUM: Magnesium: 1.8 mg/dL (ref 1.7–2.4)

## 2021-09-04 LAB — TRIGLYCERIDES: Triglycerides: 36 mg/dL (ref <150)

## 2021-09-04 MED ORDER — TRAVASOL 10 % IV SOLN
INTRAVENOUS | Status: AC
  Filled 2021-09-04: qty 921.6

## 2021-09-04 MED ORDER — MAGNESIUM SULFATE 2 GM/50ML IV SOLN
2.0000 g | Freq: Once | INTRAVENOUS | Status: AC
  Administered 2021-09-04: 2 g via INTRAVENOUS
  Filled 2021-09-04: qty 50

## 2021-09-04 NOTE — Progress Notes (Signed)
PHARMACY - TOTAL PARENTERAL NUTRITION CONSULT NOTE   Indication:  NPO for necrotizing pancreatitis  Patient Measurements: Height: 5' 10"  (177.8 cm) Weight: 77.2 kg (170 lb 3.1 oz) IBW/kg (Calculated) : 73 TPN AdjBW (KG): 71.9 Body mass index is 24.42 kg/m. Usual Weight: 80 kg  Assessment: 34 yoM re-admitted for necrotizing pancreatitis and is NPO.  Initially planning for post-pyloric tube feeds, but unable to advance NGT d/t significant duodenal stricture from pancreatic abscess and surrounding edema. Pharmacy consulted to manage TPN.  Glucose / Insulin: No hx DM. CBG goal <180 -BG range: 122 with TPN infusing; 148 with D10 infusing -mSSI q12h; 4 units required/24 hrs Electrolytes this AM are without TPN infusing, currently D10 running at 80 ml/hr: Na (136) improved. All other lytes are WNL, including CorrCa (9.88) Renal: SCr, BUN, bicarb stable & WNL Hepatic: LFTs, T.bili, Alk Phos and TG all WNL I/O: Strict I/O not measured -UOP 1825 mL + 1 unmeasured; stool x2 GI Imaging: - 9/15 CTa/p: progressive necrotizing pancreatitis w/ 8 cm suspected abscess at pancreatic head, multiple other loculated collections along pancreatic body; common hepatic duct dilation w/ persistent gallstones in GB. - 9/17 UGI: minimal passage of contrast past duodenal stricture, likely d/t mass effect from pancreatic abscess/edema -9/25: Repeating CT abdomen: GI Surgeries / Procedures:  - 8/29: Pancreatic stent placed - 9/16: NG tube placed - 9/23: NGT placed at proximal descending duodenum by IR - 9/25: CT a/p finding d/w necrotizing pancreatitis, relatively stable pancreatic/peripancreatic necrotic collection within the tail of the pancreas extending into the lesser sac without development of walled-off necrosis at this time.  Central access: Triple lumen PICC TPN start date: 9/19  Nutritional Goals:  per RD Assessment (08/28/21):  Kcal: 1800 - 2000 Protein: 85 - 95 g Fluid: 2 L/day  Current TPN  formulation at goal rate of 80 mL/hr provides 92 g protein, 1886 kcals, and 288 g dextrose.   Current Nutrition:  NPO, TPN  Difficulty placing NGT to be able to start TF. NGT was advanced by IR on 9/23, but is now back in stomach - likely due to ongoing pancreatic inflammation per GI.   TPN was disconnected for contrast administration CT last PM (only 1 site for IV access) and unable to restart due to infection control policy.  Currently has D10 running at 80 ml/hr infusing.  Therefore, AM labs do not reflect electrolyte from TPN formula yesterday.  Plan:  Now: Mag Sulfate 2g IV x 1  At 1800:  Resume TPN at goal rate 80 mL/hr  Electrolytes in TPN: Na at max concentration, unable to increase further. Na - 150 mEq/L K - 25 mEq/L Ca - 3 mEq/L Mg - 5 mEq/L Phos - 15 mmol/L Cl:Ac ratio 1:1 Add standard MVI and trace elements to TPN Continue Moderate q12h SSI and adjust as needed  No current MIVF; further orders per MD Monitor TPN labs on Mon/Thurs  Peggyann Juba, PharmD, BCPS Pharmacy: [phone] 09/04/21 7:16 AM

## 2021-09-04 NOTE — Care Management Important Message (Signed)
Important Message  Patient Details IM Letter placed in the Patient's room. Name: Hunter Ward MRN: [id number] Date of Birth: 1951/03/19   Medicare Important Message Given:  Yes     Kerin Salen 09/04/2021, 1:46 PM

## 2021-09-04 NOTE — Progress Notes (Addendum)
PROGRESS NOTE   MANOLITO JUREWICZ  GUY:[id number]    DOB: 09/08/1951    DOA: 08/24/2021  PCP: Gaynelle Arabian, MD   I have briefly reviewed patients previous medical records in [facility].  Chief Complaint  Patient presents with   Fever   Emesis   Abdominal Pain    Brief Narrative:   Hunter Ward is a 70 year old male with PMH pancreatitis, HTN, hx CCY (~30 yrs ago), deafness (uses ASL) who presented to the hospital with worsening abdomial pain and inability to keep any food for 3 days PTA.   He was recently hospitalized from 08/05/21 to 08/12/21 due to gallstone pancreatitis.  Imaging at that time showed a sizeable gallbladder remnant in the GB fossa with evidence of severe acute pancreatitis due to small retained gallstones within the fossa; MRCP also showed choledocholithiasis with 2 stones within the CBD. He then underwent ERCP on 08/07/2021 removing 2 stones, sphincterotomy performed, and placement of a plastic stent in the ventral pancreatic duct. Plan at discharge was for patient to follow-up at [facility] with advanced GI and surgery for definitive stone removal from gallbladder remnant.   He was discharged on 08/12/2021 and due to minimal recovery at home with recurrence and ongoing abdominal pain with inability to tolerate nutrition, he has presented back for further evaluation.   On admission this hospitalization, he underwent repeat CT abdomen/pelvis.  This showed progressive necrotizing pancreatitis with concern for phlegmon or abscess development measuring 8 x 5.2 cm.  There was also suspected splenic vein thrombosis on CT for which he was started on heparin drip for.  Persistent gallstones were noted in the gallbladder remnant and stable common hepatic duct dilation. Liver Doppler was then performed which showed patent portal and splenic veins, therefore heparin drip was discontinued at this time.     Assessment & Plan:  Principal Problem:   Necrotizing pancreatitis Active  Problems:   HTN (hypertension)   Normocytic anemia   Hyponatremia   Mild protein malnutrition (HCC)   Deafness   Necrotizing pancreatitis - per CT A/P on 9/15: "Progressive necrotizing pancreatitis as above, with 8.0 x 5.2 cm suspected abscess replacing the pancreatic head. Multilocular fluid collections along the pancreatic body may reflect further necrotic changes versus organizing pseudocysts. There is only minimal normal enhancing pancreatic tissue identified." - unable to be drained/approachable via IR; recommended conservative management at this time; possibly can re-image in a few days if no significant improvement or any worsening - continue zosyn for now - GI also following, appreciate assistance  - continue NPO, IVF, pain/nausea control and now on TNA via PICC line. -CT 9/25 shows chronic thrombosis of the splenic vein-GI to advise if patient needs to be anticoagulated. -NG tube initially placed but not used because it had not fully passed pylorus due to significant duodenal stricture/compression from pancreatic abscess.  Patient was treated with bowel rest, and has ongoing TPN. - 9/22, upper GI series was without gastric outlet obstruction.  It appears that IR placed feeding tube to the level of the proximal descending duodenum.  However repeat abdominal x-ray showed distal tip in the gastric antrum.  Await GI input if attempt needs to be made to advance NG tube for postpyloric feeding or completely remove it since repeat CT shows ongoing necrotizing pancreatitis although relatively stable pancreatic/peripancreatic necrotic collection. -As per GI, necrotizing pancreatitis with pseudocyst and enteral tube that cannot be advanced into the duodenum due to pancreatitis. - TPN was stopped during CT last evening,  currently on D10 infusion.  TPN to resume this evening at which time D10 infusion can be discontinued.   Essential hypertension - BP currently normal. - Hold off on any  antihypertensive agents at this time   Deafness -Communicated using ASL interpretor.   Hyponatremia -Stable/improved.  Sodium up to 136.  Hypophosphatemia - Replaced.   Normocytic anemia - baseline Hgb was ~14 g/dL in August however has now downtrended to ~10 g/dL; no overt bleeding reported; likely due to acute and prolonged illness -Hemoglobin stable in the 10 g range.  We will recheck CBC in a.m.   Constipation - Dulcolax suppository & PRN.  Having BMs now.   Body mass index is 21.22 kg/m.  Nutritional Status Nutrition Problem: Increased nutrient needs Etiology: acute illness Signs/Symptoms: estimated needs Interventions: TPN  Pressure Ulcer:     DVT prophylaxis: enoxaparin (LOVENOX) injection 40 mg Start: 08/26/21 1000     Code Status: Full Code Family Communication: None at bedside. Disposition:  Status is: Inpatient  Remains inpatient appropriate because:Inpatient level of care appropriate due to severity of illness  Dispo: The patient is from: Home              Anticipated d/c is to: Home              Patient currently is not medically stable to d/c.   Difficult to place patient No        Consultants:   [facility]  Procedures:   Core track Right upper extremity PICC line with TNA  Antimicrobials:    Anti-infectives (From admission, onward)    Start     Dose/Rate Route Frequency Ordered Stop   08/25/21 0200  piperacillin-tazobactam (ZOSYN) IVPB 3.375 g        3.375 g 12.5 mL/hr over 240 Minutes Intravenous Every 8 hours 08/24/21 2009 09/08/21 0359   08/24/21 2200  piperacillin-tazobactam (ZOSYN) IVPB 3.375 g  Status:  Discontinued        3.375 g 100 mL/hr over 30 Minutes Intravenous Every 8 hours 08/24/21 1931 08/24/21 2009   08/24/21 1915  piperacillin-tazobactam (ZOSYN) IVPB 3.375 g        3.375 g 100 mL/hr over 30 Minutes Intravenous  Once 08/24/21 1911 08/24/21 2030         Subjective:  Unfortunately did not have an ASL interpreter  this morning.  Discussed with RN at bedside.  No acute issues reported.  Patient indicates that his abdominal pain is better and that he has had 2 BMs.  Objective:   Vitals:   09/04/21 0629 09/04/21 1209 09/04/21 1236 09/04/21 1456  BP: 101/65  99/67   Pulse: 64  (!) 58   Resp: 16  16   Temp: 97.6 F (36.4 C)  97.9 F (36.6 C)   TempSrc: Oral  Oral   SpO2: 98%  100% 99%  Weight:  67.1 kg    Height:        General exam: Elderly male, moderately built and nourished, lying comfortably supine in bed. Respiratory system: Clear to auscultation.  No increased work of breathing. Cardiovascular system: S1 and S2 heard, RRR.  No JVD or pedal edema.  Gastrointestinal system: Abdomen is nondistended, minimal epigastric tenderness without peritoneal signs.  Normal bowel sounds heard.  Still has NG tube in place. Central nervous system: Alert and oriented. No focal neurological deficits. Extremities: Symmetric 5 x 5 power. Skin: No rashes, lesions or ulcers Psychiatry: Judgement and insight appear normal. Mood & affect appropriate.  Data Reviewed:   I have personally reviewed following labs and imaging studies   CBC: Recent Labs  Lab 08/29/21 0326  WBC 9.2  NEUTROABS 7.4  HGB 10.2*  HCT 30.8*  MCV 98.1  PLT 875    Basic Metabolic Panel: Recent Labs  Lab 09/02/21 0407 09/03/21 0335 09/04/21 0416  NA 130* 132* 136  K 4.4 4.1 4.0  CL 100 100 104  CO2 24 26 27   GLUCOSE 137* 139* 145*  BUN 13 15 13   CREATININE 0.57* 0.57* 0.63  CALCIUM 8.5* 8.1* 8.6*  MG 2.0 2.0 1.8  PHOS 3.5 3.1 3.1    Liver Function Tests: Recent Labs  Lab 08/30/21 0459 08/31/21 0444 09/04/21 0416  AST 17 20 30   ALT 12 13 24   ALKPHOS 47 48 50  BILITOT 0.6 0.4 0.6  PROT 5.2* 5.4* 6.2*  ALBUMIN 2.0* 2.0* 2.4*    CBG: Recent Labs  Lab 09/03/21 0607 09/03/21 2040 09/04/21 0655  GLUCAP 128* 122* 148*    Microbiology Studies:   No results found for this or any previous visit (from  the past 240 hour(s)).    Radiology Studies:  CT ABDOMEN PELVIS W CONTRAST  Result Date: 09/03/2021 CLINICAL DATA:  Necrotizing pancreatitis, progressive abdominal pain, vomiting EXAM: CT ABDOMEN AND PELVIS WITH CONTRAST TECHNIQUE: Multidetector CT imaging of the abdomen and pelvis was performed using the standard protocol following bolus administration of intravenous contrast. CONTRAST:  45mL OMNIPAQUE IOHEXOL 350 MG/ML SOLN COMPARISON:  08/24/2021 FINDINGS: Lower chest: Small bilateral pleural effusions are again identified, slightly enlarged on the left in relation to prior examination, with associated bilateral lower lobe compressive atelectasis. Moderate multi-vessel coronary artery calcification. Central venous catheter tip noted within the superior right atrium. Nasoenteric feeding tube is seen with its weighted tip just beyond the gastroesophageal junction within the gastric cardia. Hepatobiliary: There is stable mild intrahepatic and moderate extrahepatic biliary ductal dilation with the extrahepatic bile duct measuring up to 20 mm in diameter at the biliary hilum. The extrahepatic duct, again, is markedly tapered within the pancreatic head. Numerous calcified gallstones are seen within a contracted gallbladder in keeping with changes of chronic cholecystitis in the appropriate clinical setting. The liver is unremarkable. Pancreas: The central pancreatic duct within the pancreatic head has undergone endoscopic stenting, unchanged from prior exam. The pancreas is edematous with heterogeneous enhancement, partly related to adjacent barium contrast within the colon, but in keeping with changes of necrotizing pancreatitis, best appreciated within the tail the pancreas. The main pancreatic duct is not clearly identified. Mixed pancreatic and peripancreatic necrotic collection involving the tail the pancreas and extending into the lesser sac abutting the fundus of the stomach appears relatively stable in  size measuring roughly 5.2 x 6.2 x 7.6 cm on axial image # 21/2 and coronal image # 50/6. No enhancing wall identified to suggest maturation 2 focal walled-off necrosis. Spleen: The spleen is unremarkable save for thrombosis of the splenic vein. Adrenals/Urinary Tract: Stable 22 mm left adrenal adenoma, better assessed on prior MRI examination of 08/06/2021. The adrenal glands are otherwise unremarkable. The kidneys are normal. The bladder is unremarkable. Stomach/Bowel: Stomach, small bowel, and large bowel are unremarkable. Mild ascites. No free intraperitoneal gas. Vascular/Lymphatic: As noted above, there is chronic thrombosis of the splenic vein with numerous retroperitoneal collaterals involving the peripancreatic tissue and peribiliary veins as well as the gastroepiploic arcade. The main portal vein is patent. The confluence of the superior mesenteric vein and main portal vein is markedly narrowed  secondary to the adjacent inflammatory process involving the head of the pancreas, best seen on coronal image # 43, however, this still remains patent. Imaging is slightly limited by streak artifact, however, the vessel appears to demonstrate a minimal diameter of 2-3 mm. Moderate aortoiliac atherosclerotic calcification. No aortic aneurysm. Reproductive: Moderate prostatic enlargement. Other: No abdominal wall hernia. Musculoskeletal: The osseous structures are age-appropriate. No acute bone abnormality. IMPRESSION: Findings in keeping with necrotizing pancreatitis with heterogeneous enhancement of the pancreatic parenchyma, particularly within the head of the pancreas though this is partially artifactual related to retained barium contrast within the colon. Relatively stable pancreatic/peripancreatic necrotic collection within the tail of the pancreas extending into the lesser sac without development of walled-off necrosis at this time. Chronic thrombosis of the splenic vein. Marked narrowing of the confluence of  the superior mesenteric vein and portal vein though these venous structures remain patent. Numerous retroperitoneal venous collaterals within the peripancreatic and peribiliary regions. Stable endoscopic stenting of the central pancreatic duct. Stable moderate extrahepatic biliary ductal dilation with marked tapering of the extrahepatic bile duct within the pancreatic head. Correlation with liver enzymes may be helpful to assess for an underlying biliary obstruction secondary to the adjacent inflammatory process. Anasarca with small bilateral pleural effusions, enlarging on the left, and mild ascites, stable since prior examination. Stable appearance of a gallstones within a contracted gallbladder in keeping with changes of chronic cholecystitis in the appropriate clinical setting. Electronically Signed   By: Fidela Salisbury M.D.   On: 09/03/2021 22:30     Scheduled Meds:    Chlorhexidine Gluconate Cloth  6 each Topical Daily   enoxaparin (LOVENOX) injection  40 mg Subcutaneous Daily   insulin aspart  0-15 Units Subcutaneous Q12H   pantoprazole (PROTONIX) IV  40 mg Intravenous Q24H    Continuous Infusions:    dextrose 80 mL/hr at 09/03/21 2237   piperacillin-tazobactam (ZOSYN)  IV 3.375 g (09/04/21 1220)   TPN ADULT (ION)       LOS: 10 days     Vernell Leep, MD, [place], [facility]. [facility]    To contact the attending provider between 7A-7P or the covering provider during after hours 7P-7A, please log into the web site [email or web address] and access using universal [place] password for that web site. If you do not have the password, please call the hospital operator.  09/04/2021, 4:07 PM

## 2021-09-04 NOTE — Progress Notes (Signed)
Nutrition Follow-up  DOCUMENTATION CODES:   Not applicable  INTERVENTION:  - TPN per Pharmacist. - will monitor for plan concerning re-insertion of small bore NGT.  - weigh patient today. - complete NFPE when feasible.   NUTRITION DIAGNOSIS:   Increased nutrient needs related to acute illness as evidenced by estimated needs. -ongoing  GOAL:   Patient will meet greater than or equal to 90% of their needs -met with TPN  MONITOR:   Labs, Weight trends, I & O's, Other (Comment) (TPN regimen; plan concerning small bore NGT re-insertion)  ASSESSMENT:   70 year old male with PMH pancreatitis, HTN, hx CCY (~30 yrs ago), deafness (uses ASL) who presented to the hospital with worsening abdomial pain and inability to keep any food down the past 3 days. His last meal kept down was on 08/22/21.  Significant Events: 9/16- admission; initial RD assessment; small bore NGT placed in R nare 9/19- triple lumen PICC placed in R basilic; TPN initiation 7/35- small bore NGT removed   He is noted to be deaf and require translator for ASL.   Patient is currently out of the room to CT. TPN turned off last night in order for contrast to be given for today's CT; D10 ordered for this reason.   Plan is to resume TPN tonight at goal rate of 80 ml/hr which provides 1886 kcal and 92 grams protein.   Able to communicate with MD and RN and plan is for CT to be read and for GI to weigh in on if small bore NGT will be replaced. Previously advanced to proximal duodenum by IR but repeat abdominal x-ray then showed it was in the gastric antrum and tube was subsequently removed.   He has been NPO since admission.  He has not been weighed since 9/19. No information documented in the edema section of flow sheet this admission. He is noted to be +3.05 L since admission.    Labs reviewed; CBG: 148 mg/dl, Ca: 8.6 mg/dl. Medications reviewed; sliding scale novolog, 2 g IV Mg sulfate x1 run 9/26, 40 mg IV  protonix/day. IVF; D10 @ 80 ml/hr (653 kcal/24 hrs).   Diet Order:   Diet Order             Diet NPO time specified  Diet effective now                   EDUCATION NEEDS:   No education needs have been identified at this time  Skin:  Skin Assessment: Reviewed RN Assessment  Last BM:  9/24 (type 6)  Height:   Ht Readings from Last 1 Encounters:  08/25/21 5' 10" (1.778 m)    Weight:   Wt Readings from Last 1 Encounters:  08/28/21 77.2 kg    Estimated Nutritional Needs:  Kcal:  1800-2000 Protein:  85-95g Fluid:  2L/day     Jarome Matin, MS, RD, LDN, CNSC Inpatient Clinical Dietitian RD pager # available in AMION  After hours/weekend pager # available in [facility]

## 2021-09-04 NOTE — Progress Notes (Signed)
Pt's feeding tube has come out. GI made aware.

## 2021-09-05 ENCOUNTER — Inpatient Hospital Stay (HOSPITAL_COMMUNITY): Payer: Medicare Other

## 2021-09-05 DIAGNOSIS — K8591 Acute pancreatitis with uninfected necrosis, unspecified: Secondary | ICD-10-CM | POA: Diagnosis not present

## 2021-09-05 LAB — CBC
HCT: 31.5 % — ABNORMAL LOW (ref 39.0–52.0)
Hemoglobin: 10.2 g/dL — ABNORMAL LOW (ref 13.0–17.0)
MCH: 31.7 pg (ref 26.0–34.0)
MCHC: 32.4 g/dL (ref 30.0–36.0)
MCV: 97.8 fL (ref 80.0–100.0)
Platelets: 209 10*3/uL (ref 150–400)
RBC: 3.22 MIL/uL — ABNORMAL LOW (ref 4.22–5.81)
RDW: 13.4 % (ref 11.5–15.5)
WBC: 6.9 10*3/uL (ref 4.0–10.5)
nRBC: 0 % (ref 0.0–0.2)

## 2021-09-05 LAB — BASIC METABOLIC PANEL
Anion gap: 6 (ref 5–15)
BUN: 14 mg/dL (ref 8–23)
CO2: 28 mmol/L (ref 22–32)
Calcium: 8.5 mg/dL — ABNORMAL LOW (ref 8.9–10.3)
Chloride: 102 mmol/L (ref 98–111)
Creatinine, Ser: 0.58 mg/dL — ABNORMAL LOW (ref 0.61–1.24)
GFR, Estimated: >60 mL/min (ref >60)
Glucose, Bld: 150 mg/dL — ABNORMAL HIGH (ref 70–99)
Potassium: 4.2 mmol/L (ref 3.5–5.1)
Sodium: 136 mmol/L (ref 135–145)

## 2021-09-05 LAB — PHOSPHORUS: Phosphorus: 3.2 mg/dL (ref 2.5–4.6)

## 2021-09-05 LAB — MAGNESIUM: Magnesium: 2.2 mg/dL (ref 1.7–2.4)

## 2021-09-05 LAB — GLUCOSE, CAPILLARY
Glucose-Capillary: 124 mg/dL — ABNORMAL HIGH (ref 70–99)
Glucose-Capillary: 150 mg/dL — ABNORMAL HIGH (ref 70–99)

## 2021-09-05 MED ORDER — TRAVASOL 10 % IV SOLN
INTRAVENOUS | Status: AC
  Filled 2021-09-05: qty 921.6

## 2021-09-05 NOTE — Plan of Care (Signed)
  Problem: Clinical Measurements: Goal: Will remain free from infection Outcome: Progressing   Problem: Pain Managment: Goal: General experience of comfort will improve Outcome: Progressing   Problem: Nutrition: Goal: Adequate nutrition will be maintained Outcome: Not Progressing

## 2021-09-05 NOTE — Progress Notes (Signed)
[facility] Progress Note  Hunter Ward 70 y.o. August 24, 1951  CC:  Necrotizing pancreatitis   Subjective: Patient reports nausea today and had one episode of non-bloody, non-bilious emesis earlier this morning.  He reports continued upper abdominal discomfort as well.  Reports a BM yesterday.  Feeding tube came out again yesterday.  Encounter conducted with [employer] video interpreting services [name] [phone], Janett Billow [phone])  ROS : Review of Systems  Cardiovascular:  Negative for chest pain and palpitations.  Gastrointestinal:  Positive for abdominal pain, nausea and vomiting. Negative for blood in stool, constipation, diarrhea and melena.     Objective: Vital signs in last 24 hours: Vitals:   09/05/21 0513 09/05/21 0555  BP: (!) 89/57 96/64  Pulse: (!) 55   Resp:    Temp:    SpO2:      Physical Exam:  General:  Alert, cooperative, no distress  Head:  Normocephalic, without obvious abnormality, atraumatic  Eyes:  Anicteric sclera, EOMs intact  Lungs:   Clear to auscultation bilaterally, respirations unlabored  Heart:  Regular rate and rhythm, S1, S2 normal  Abdomen:   Soft and non-distended with upper abdominal tenderness to palpation, no peritoneal signs, normoactive bowel sounds     Lab Results: Recent Labs    09/04/21 0416 09/05/21 0339  NA 136 136  K 4.0 4.2  CL 104 102  CO2 27 28  GLUCOSE 145* 150*  BUN 13 14  CREATININE 0.63 0.58*  CALCIUM 8.6* 8.5*  MG 1.8 2.2  PHOS 3.1 3.2   Recent Labs    09/04/21 0416  AST 30  ALT 24  ALKPHOS 50  BILITOT 0.6  PROT 6.2*  ALBUMIN 2.4*   Recent Labs    09/05/21 0339  WBC 6.9  HGB 10.2*  HCT 31.5*  MCV 97.8  PLT 209   No results for input(s): LABPROT, INR in the last 72 hours.    Assessment: Necrotizing pancreatitis  -Repeat CT 9/25: Relatively stable pancreatic/peripancreatic necrotic collection within the tail of the pancreas extending into the lesser sac without development of   walled-off necrosis at this time.  Stable moderate extrahepatic biliary ductal dilation with marked tapering of the extrahepatic bile duct within the pancreatic head  -Leukocytosis has resolved, WBCs 6.9 -Normal renal function: BUN 14/ Cr 0.58 -Normal LFTs as of 9/26: T. Bili 0.6/ AST 30/ ALT 24/ ALP 50  Plan: Recommend IR placement of nasojejunal feeding tube to level of ligament of Treitz.  This would be preferred over IV TPN, but if retry of feeding tube is not successful, will continue with IV TPN.  Continue NPO status for now.  Continue supportive care.  [facility] will follow.  Salley Slaughter PA-C 09/05/2021, 12:07 PM  Contact #  [phone]

## 2021-09-05 NOTE — Plan of Care (Signed)
Hunter Ward has maintained NPO today. Expresses frustration with slow progress, wants to know when he can eat again. GI team in to see pt and discussed POC with pt and family at bedside (sister and niece). Pt did vomit approx 113ml green, bilious emesis after getting oob to chair. No further emesis after compazine administered. Pt is now down at IR for attempted dobhoff feeding tube placement.  Problem: Education: Goal: Knowledge of General Education information will improve Description: Including pain rating scale, medication(s)/side effects and non-pharmacologic comfort measures Outcome: Progressing   Problem: Health Behavior/Discharge Planning: Goal: Ability to manage health-related needs will improve Outcome: Progressing   Problem: Clinical Measurements: Goal: Ability to maintain clinical measurements within normal limits will improve Outcome: Progressing Goal: Will remain free from infection Outcome: Progressing Note: IV abx, afebrile, WBC WNL Goal: Diagnostic test results will improve Outcome: Progressing Goal: Respiratory complications will improve Outcome: Progressing Goal: Cardiovascular complication will be avoided Outcome: Progressing   Problem: Activity: Goal: Risk for activity intolerance will decrease Outcome: Progressing   Problem: Nutrition: Goal: Adequate nutrition will be maintained Outcome: Progressing Note: TPN. IR attempting feeding tube placement today.    Problem: Coping: Goal: Level of anxiety will decrease Outcome: Progressing   Problem: Elimination: Goal: Will not experience complications related to bowel motility Outcome: Progressing Goal: Will not experience complications related to urinary retention Outcome: Progressing   Problem: Pain Managment: Goal: General experience of comfort will improve Outcome: Progressing   Problem: Safety: Goal: Ability to remain free from injury will improve Outcome: Progressing   Problem: Skin Integrity: Goal:  Risk for impaired skin integrity will decrease Outcome: Progressing

## 2021-09-05 NOTE — Progress Notes (Signed)
PHARMACY - TOTAL PARENTERAL NUTRITION CONSULT NOTE   Indication:  NPO for necrotizing pancreatitis  Patient Measurements: Height: 5' 10" (177.8 cm) Weight: 67.1 kg (147 lb 14.4 oz) IBW/kg (Calculated) : 73 TPN AdjBW (KG): 71.9 Body mass index is 21.22 kg/m. Usual Weight: 80 kg  Assessment: 70 yoM re-admitted for necrotizing pancreatitis and is NPO.  Initially planning for post-pyloric tube feeds, but unable to advance NGT d/t significant duodenal stricture from pancreatic abscess and surrounding edema. Pharmacy consulted to manage TPN.  Glucose / Insulin: No hx DM. CBG goal <180 -BG range: 150-157 with TPN resumed -mSSI q12h; 3 units required/24 hrs Electrolytes: Na (136) improved with max concentration in TPN. All other lytes are WNL, including CorrCa (9.78) Renal: SCr, BUN, bicarb stable & WNL Hepatic: LFTs, T.bili, Alk Phos and TG all WNL (9/26) I/O: Strict I/O not measured -UOP 1455 mL + 2 unmeasured; no stool documented yesterdy GI Imaging: - 9/15 CTa/p: progressive necrotizing pancreatitis w/ 8 cm suspected abscess at pancreatic head, multiple other loculated collections along pancreatic body; common hepatic duct dilation w/ persistent gallstones in GB. - 9/17 UGI: minimal passage of contrast past duodenal stricture, likely d/t mass effect from pancreatic abscess/edema -9/25: CT a/p finding d/w necrotizing pancreatitis, relatively stable pancreatic/peripancreatic necrotic collection within the tail of the pancreas extending into the lesser sac without development of walled-off necrosis at this time. GI Surgeries / Procedures:  - 8/29: Pancreatic stent placed - 9/16: NG tube placed - 9/23: NGT placed at proximal descending duodenum by IR  Central access: Triple lumen PICC TPN start date: 9/19  Nutritional Goals:  per RD Assessment (08/28/21):  Kcal: 1800 - 2000 Protein: 85 - 95 g Fluid: 2 L/day  Current TPN formulation at goal rate of 80 mL/hr provides 92 g protein,  1886 kcals, and 288 g dextrose.   Current Nutrition:  NPO, TPN  Significant Event: Difficulty placing NGT to be able to start TF.  9/23: NGT was advanced by IR, but is now back in stomach - likely due to ongoing pancreatic inflammation per GI.  9/26: TPN was disconnected for contrast administration CT last PM (only 1 site for IV access) and unable to restart due to infection control policy.  Currently has D10 running at 80 ml/hr infusing.  Therefore, AM labs do not reflect electrolyte from TPN formula yesterday. - NGT came out. 9/27: TPN resumed last PM  Plan:   At 1800:  Continue TPN at goal rate 80 mL/hr  Electrolytes in TPN: Na at max concentration, unable to increase further. Na - 150 mEq/L K - 25 mEq/L Ca - 3 mEq/L Mg - 5 mEq/L Phos - 15 mmol/L Cl:Ac ratio 1:1 Add standard MVI and trace elements to TPN Continue Moderate q12h SSI and adjust as needed  No current MIVF; further orders per MD Monitor TPN labs on Mon/Thurs  Erin Williamson, PharmD, BCPS Pharmacy: [phone] 09/05/21 7:25 AM  

## 2021-09-05 NOTE — Progress Notes (Signed)
PROGRESS NOTE   Hunter Ward  RWE:[id number]    DOB: 02-04-1951    DOA: 08/24/2021  PCP: Gaynelle Arabian, MD   I have briefly reviewed patients previous medical records in [facility].  Chief Complaint  Patient presents with   Fever   Emesis   Abdominal Pain    Brief Narrative:   Hunter Ward is a 70 year old male with PMH pancreatitis, HTN, hx CCY (~30 yrs ago), deafness (uses ASL) who presented to the hospital with worsening abdomial pain and inability to keep any food for 3 days PTA.   He was recently hospitalized from 08/05/21 to 08/12/21 due to gallstone pancreatitis.  Imaging at that time showed a sizeable gallbladder remnant in the GB fossa with evidence of severe acute pancreatitis due to small retained gallstones within the fossa; MRCP also showed choledocholithiasis with 2 stones within the CBD. He then underwent ERCP on 08/07/2021 removing 2 stones, sphincterotomy performed, and placement of a plastic stent in the ventral pancreatic duct. Plan at discharge was for patient to follow-up at [facility] with advanced GI and surgery for definitive stone removal from gallbladder remnant.   He was discharged on 08/12/2021 and due to minimal recovery at home with recurrence and ongoing abdominal pain with inability to tolerate nutrition, he has presented back for further evaluation.   On admission this hospitalization, he underwent repeat CT abdomen/pelvis.  This showed progressive necrotizing pancreatitis with concern for phlegmon or abscess development measuring 8 x 5.2 cm.  There was also suspected splenic vein thrombosis on CT for which he was started on heparin drip.  Persistent gallstones were noted in the gallbladder remnant and stable common hepatic duct dilation. Liver Doppler was then performed which showed patent portal and splenic veins, therefore heparin drip was discontinued at this time.     Assessment & Plan:  Principal Problem:   Necrotizing pancreatitis Active  Problems:   HTN (hypertension)   Normocytic anemia   Hyponatremia   Mild protein malnutrition (HCC)   Deafness   Necrotizing pancreatitis - CT A/P 9/15: Progressive necrotizing pancreatitis, with 8 x 5.2 cm suspected abscess replacing the pancreatic head.  Multilocular fluid collections along the pancreatic body may reflect further necrotic changes versus organizing pseudocyst.  Only minimal normal enhancing pancreatic tissue identified. - CT A/P 9/25: Necrotizing pancreatitis with heterogenous enhancement of the pancreatic parenchyma, particularly within the head of the pancreas.  Relatively stable pancreatic/peripancreatic necrotic collection within the tail of the pancreas.  No walled off necrosis at this time. - Unable to be drained/approachable via IR and conservative management recommended. [facility] following and guiding with management. - Has been n.p.o., prior attempts to place postpyloric NG tube by IR unsuccessful initially due to gastric outlet obstruction, ongoing TNA via right upper extremity PICC line and on IV Zosyn since 9/15. -Upper GI series 9/22: Without gastric outlet obstruction but still unable to place postpyloric NG tube. -As per GI follow-up 9/27:  -Nasoenteric feeds strongly preferred despite 2 prior failed attempts to place same.  GI have consulted IR to assist for placement of nasojejunal feeding tube at least to the level of proximal jejunum. -If NJT not successful then would need to be on TPN at least until able to eat enough to meet nutritional needs which would be at least a couple of weeks.  -He will need to remain in the hospital until we figure out pain NJT Vs TPN and how long he will need nutritional support.   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  Portal vein was patent. - Seen again on CT A/P 9/25: Chronic thrombosis of the splenic vein. - As per GI, would not anticoagulate in the setting of necrotizing pancreatitis due to  significant concern of inducing hemorrhagic pancreatitis.  Essential hypertension -Some soft BPs earlier this morning.  These have improved. - Hold off on any antihypertensive agents at this time   Deafness -Communicated using ASL interpretor.   Hyponatremia -Stable/improved.  Sodium up to 136.  Hypophosphatemia - Replaced.   Normocytic anemia - baseline Hgb was ~14 g/dL in August however has now downtrended to ~10 g/dL; no overt bleeding reported; likely due to acute and prolonged illness -Hemoglobin stable in the 10 g range.     Constipation - Dulcolax suppository & PRN.  Having BMs now.  Physical deconditioning - Physical therapy evaluation and management.   Body mass index is 21.22 kg/m.  Nutritional Status Nutrition Problem: Increased nutrient needs Etiology: acute illness Signs/Symptoms: estimated needs Interventions: TPN  Pressure Ulcer:     DVT prophylaxis: enoxaparin (LOVENOX) injection 40 mg Start: 08/26/21 1000     Code Status: Full Code Family Communication: Discussed in detail with patient's sister on 9/27.  Updated care and answered all questions. Disposition:  Status is: Inpatient  Remains inpatient appropriate because:Inpatient level of care appropriate due to severity of illness  Dispo: The patient is from: Home              Anticipated d/c is to: Home.  As per GI, anticipate a very lengthy ongoing hospital course, suspect patient will need to be in the hospital for at least another 7 to 10 days, very likely more.              Patient currently is not medically stable to d/c.   Difficult to place patient No        Consultants:   [facility]  Procedures:   Core track Right upper extremity PICC line with TNA  Antimicrobials:    Anti-infectives (From admission, onward)    Start     Dose/Rate Route Frequency Ordered Stop   08/25/21 0200  piperacillin-tazobactam (ZOSYN) IVPB 3.375 g        3.375 g 12.5 mL/hr over 240 Minutes Intravenous  Every 8 hours 08/24/21 2009 09/08/21 0359   08/24/21 2200  piperacillin-tazobactam (ZOSYN) IVPB 3.375 g  Status:  Discontinued        3.375 g 100 mL/hr over 30 Minutes Intravenous Every 8 hours 08/24/21 1931 08/24/21 2009   08/24/21 1915  piperacillin-tazobactam (ZOSYN) IVPB 3.375 g        3.375 g 100 mL/hr over 30 Minutes Intravenous  Once 08/24/21 1911 08/24/21 2030         Subjective:  Patient interviewed and examined using the ASL interpreter.  Minimal epigastric region abdominal pain.  Denies any other complaints.  Objective:   Vitals:   09/05/21 0507 09/05/21 0513 09/05/21 0555 09/05/21 1318  BP: (!) 86/61 (!) 89/57 96/64 (!) 107/59  Pulse: (!) 57 (!) 55  64  Resp: 15   18  Temp: 97.8 F (36.6 C)   (!) 97.4 F (36.3 C)  TempSrc:    Oral  SpO2: 98%   100%  Weight:      Height:        General exam: Elderly male, moderately built and nourished, lying comfortably supine in bed.  Oral mucosa moist. Respiratory system: Clear to auscultation.  No increased  work of breathing. Cardiovascular system: S1 and S2 heard, RRR.  No JVD or pedal edema.  Gastrointestinal system: Abdomen nondistended, soft and nontender.  No organomegaly or masses appreciated.  Normal bowel sounds heard. Central nervous system: Alert and oriented. No focal neurological deficits. Extremities: Symmetric 5 x 5 power. Skin: No rashes, lesions or ulcers Psychiatry: Judgement and insight appear normal. Mood & affect appropriate.     Data Reviewed:   I have personally reviewed following labs and imaging studies   CBC: Recent Labs  Lab 09/05/21 0339  WBC 6.9  HGB 10.2*  HCT 31.5*  MCV 97.8  PLT 683    Basic Metabolic Panel: Recent Labs  Lab 09/03/21 0335 09/04/21 0416 09/05/21 0339  NA 132* 136 136  K 4.1 4.0 4.2  CL 100 104 102  CO2 26 27 28   GLUCOSE 139* 145* 150*  BUN 15 13 14   CREATININE 0.57* 0.63 0.58*  CALCIUM 8.1* 8.6* 8.5*  MG 2.0 1.8 2.2  PHOS 3.1 3.1 3.2    Liver  Function Tests: Recent Labs  Lab 08/30/21 0459 08/31/21 0444 09/04/21 0416  AST 17 20 30   ALT 12 13 24   ALKPHOS 47 48 50  BILITOT 0.6 0.4 0.6  PROT 5.2* 5.4* 6.2*  ALBUMIN 2.0* 2.0* 2.4*    CBG: Recent Labs  Lab 09/04/21 1740 09/04/21 2104 09/05/21 0849  GLUCAP 151* 157* 124*    Microbiology Studies:   No results found for this or any previous visit (from the past 240 hour(s)).    Radiology Studies:  CT ABDOMEN PELVIS W CONTRAST  Result Date: 09/03/2021 CLINICAL DATA:  Necrotizing pancreatitis, progressive abdominal pain, vomiting EXAM: CT ABDOMEN AND PELVIS WITH CONTRAST TECHNIQUE: Multidetector CT imaging of the abdomen and pelvis was performed using the standard protocol following bolus administration of intravenous contrast. CONTRAST:  22mL OMNIPAQUE IOHEXOL 350 MG/ML SOLN COMPARISON:  08/24/2021 FINDINGS: Lower chest: Small bilateral pleural effusions are again identified, slightly enlarged on the left in relation to prior examination, with associated bilateral lower lobe compressive atelectasis. Moderate multi-vessel coronary artery calcification. Central venous catheter tip noted within the superior right atrium. Nasoenteric feeding tube is seen with its weighted tip just beyond the gastroesophageal junction within the gastric cardia. Hepatobiliary: There is stable mild intrahepatic and moderate extrahepatic biliary ductal dilation with the extrahepatic bile duct measuring up to 20 mm in diameter at the biliary hilum. The extrahepatic duct, again, is markedly tapered within the pancreatic head. Numerous calcified gallstones are seen within a contracted gallbladder in keeping with changes of chronic cholecystitis in the appropriate clinical setting. The liver is unremarkable. Pancreas: The central pancreatic duct within the pancreatic head has undergone endoscopic stenting, unchanged from prior exam. The pancreas is edematous with heterogeneous enhancement, partly related to  adjacent barium contrast within the colon, but in keeping with changes of necrotizing pancreatitis, best appreciated within the tail the pancreas. The main pancreatic duct is not clearly identified. Mixed pancreatic and peripancreatic necrotic collection involving the tail the pancreas and extending into the lesser sac abutting the fundus of the stomach appears relatively stable in size measuring roughly 5.2 x 6.2 x 7.6 cm on axial image # 21/2 and coronal image # 50/6. No enhancing wall identified to suggest maturation 2 focal walled-off necrosis. Spleen: The spleen is unremarkable save for thrombosis of the splenic vein. Adrenals/Urinary Tract: Stable 22 mm left adrenal adenoma, better assessed on prior MRI examination of 08/06/2021. The adrenal glands are otherwise unremarkable. The kidneys are normal.  The bladder is unremarkable. Stomach/Bowel: Stomach, small bowel, and large bowel are unremarkable. Mild ascites. No free intraperitoneal gas. Vascular/Lymphatic: As noted above, there is chronic thrombosis of the splenic vein with numerous retroperitoneal collaterals involving the peripancreatic tissue and peribiliary veins as well as the gastroepiploic arcade. The main portal vein is patent. The confluence of the superior mesenteric vein and main portal vein is markedly narrowed secondary to the adjacent inflammatory process involving the head of the pancreas, best seen on coronal image # 43, however, this still remains patent. Imaging is slightly limited by streak artifact, however, the vessel appears to demonstrate a minimal diameter of 2-3 mm. Moderate aortoiliac atherosclerotic calcification. No aortic aneurysm. Reproductive: Moderate prostatic enlargement. Other: No abdominal wall hernia. Musculoskeletal: The osseous structures are age-appropriate. No acute bone abnormality. IMPRESSION: Findings in keeping with necrotizing pancreatitis with heterogeneous enhancement of the pancreatic parenchyma, particularly  within the head of the pancreas though this is partially artifactual related to retained barium contrast within the colon. Relatively stable pancreatic/peripancreatic necrotic collection within the tail of the pancreas extending into the lesser sac without development of walled-off necrosis at this time. Chronic thrombosis of the splenic vein. Marked narrowing of the confluence of the superior mesenteric vein and portal vein though these venous structures remain patent. Numerous retroperitoneal venous collaterals within the peripancreatic and peribiliary regions. Stable endoscopic stenting of the central pancreatic duct. Stable moderate extrahepatic biliary ductal dilation with marked tapering of the extrahepatic bile duct within the pancreatic head. Correlation with liver enzymes may be helpful to assess for an underlying biliary obstruction secondary to the adjacent inflammatory process. Anasarca with small bilateral pleural effusions, enlarging on the left, and mild ascites, stable since prior examination. Stable appearance of a gallstones within a contracted gallbladder in keeping with changes of chronic cholecystitis in the appropriate clinical setting. Electronically Signed   By: Fidela Salisbury M.D.   On: 09/03/2021 22:30     Scheduled Meds:    Chlorhexidine Gluconate Cloth  6 each Topical Daily   enoxaparin (LOVENOX) injection  40 mg Subcutaneous Daily   insulin aspart  0-15 Units Subcutaneous Q12H   pantoprazole (PROTONIX) IV  40 mg Intravenous Q24H    Continuous Infusions:    piperacillin-tazobactam (ZOSYN)  IV 3.375 g (09/05/21 1122)   TPN ADULT (ION) 80 mL/hr at 09/04/21 1807   TPN ADULT (ION)       LOS: 11 days     Vernell Leep, MD, [place], [facility]. [facility]    To contact the attending provider between 7A-7P or the covering provider during after hours 7P-7A, please log into the web site [email or web address] and access using universal [place] password for that web site. If  you do not have the password, please call the hospital operator.  09/05/2021, 4:25 PM

## 2021-09-06 DIAGNOSIS — H9190 Unspecified hearing loss, unspecified ear: Secondary | ICD-10-CM | POA: Diagnosis not present

## 2021-09-06 DIAGNOSIS — I10 Essential (primary) hypertension: Secondary | ICD-10-CM | POA: Diagnosis not present

## 2021-09-06 DIAGNOSIS — K8591 Acute pancreatitis with uninfected necrosis, unspecified: Secondary | ICD-10-CM | POA: Diagnosis not present

## 2021-09-06 DIAGNOSIS — E871 Hypo-osmolality and hyponatremia: Secondary | ICD-10-CM | POA: Diagnosis not present

## 2021-09-06 LAB — BASIC METABOLIC PANEL
Anion gap: 4 — ABNORMAL LOW (ref 5–15)
BUN: 19 mg/dL (ref 8–23)
CO2: 30 mmol/L (ref 22–32)
Calcium: 8.8 mg/dL — ABNORMAL LOW (ref 8.9–10.3)
Chloride: 110 mmol/L (ref 98–111)
Creatinine, Ser: 0.72 mg/dL (ref 0.61–1.24)
GFR, Estimated: >60 mL/min (ref >60)
Glucose, Bld: 135 mg/dL — ABNORMAL HIGH (ref 70–99)
Potassium: 4.2 mmol/L (ref 3.5–5.1)
Sodium: 144 mmol/L (ref 135–145)

## 2021-09-06 LAB — GLUCOSE, CAPILLARY
Glucose-Capillary: 117 mg/dL — ABNORMAL HIGH (ref 70–99)
Glucose-Capillary: 120 mg/dL — ABNORMAL HIGH (ref 70–99)

## 2021-09-06 MED ORDER — TRAVASOL 10 % IV SOLN
INTRAVENOUS | Status: AC
  Filled 2021-09-06: qty 921.6

## 2021-09-06 NOTE — Progress Notes (Signed)
[facility] Gastroenterology Progress Note  Hunter Ward 70 y.o. [birth year]  CC:  Necrotizing pancreatitis   Subjective: Patient reports no nausea or vomiting today. Had epigastric pain at level 5-6 per RN this morning, when patient was examined he reports pain level has been decreased after administration of pain medication.  ROS : Review of Systems  Cardiovascular:  Negative for chest pain and palpitations.  Gastrointestinal:  Positive for abdominal pain. Negative for blood in stool, constipation, diarrhea, heartburn, melena, nausea and vomiting.     Objective: Vital signs in last 24 hours: Vitals:   09/05/21 2021 09/06/21 0506  BP: 105/67 112/66  Pulse: 60 63  Resp:    Temp: 98.2 F (36.8 C) 98 F (36.7 C)  SpO2: 98% 98%    Physical Exam:  General:  Alert, cooperative, no distress, deconditioned and frail  Head:  Normocephalic, without obvious abnormality, atraumatic  Eyes:  Anicteric sclera, EOM's intact  Lungs:   Clear to auscultation bilaterally, respirations unlabored  Heart:  Regular rate and rhythm, S1, S2 normal  Abdomen:   Soft and non-distended with upper abdominal tenderness to palpation, no peritoneal signs, normoactive bowel sounds     Lab Results: Recent Labs    09/04/21 0416 09/05/21 0339 09/06/21 0316  NA 136 136 144  K 4.0 4.2 4.2  CL 104 102 110  CO2 27 28 30   GLUCOSE 145* 150* 135*  BUN 13 14 19   CREATININE 0.63 0.58* 0.72  CALCIUM 8.6* 8.5* 8.8*  MG 1.8 2.2  --   PHOS 3.1 3.2  --    Recent Labs    09/04/21 0416  AST 30  ALT 24  ALKPHOS 50  BILITOT 0.6  PROT 6.2*  ALBUMIN 2.4*   Recent Labs    09/05/21 0339  WBC 6.9  HGB 10.2*  HCT 31.5*  MCV 97.8  PLT 209   No results for input(s): LABPROT, INR in the last 72 hours.    Assessment Necrotizing pancreatitis  -9/27: Feeding tube tip was placed in the proximal duodenum, but could not be advanced further, the same as prior attempts. Redundant catheter was placed in the  stomach in hopes peristalsis or gravity will take the tube further downstream. -Repeat CT 9/25: Relatively stable pancreatic/peripancreatic necrotic collection within the tail of the pancreas extending into the lesser sac without development of  walled-off necrosis at this time.  Stable moderate extrahepatic biliary ductal dilation with marked tapering of the extrahepatic bile duct within the pancreatic head  -Leukocytosis has resolved, WBCs 6.9 -Normal renal function: BUN 19/ Cr 0.72 -Normal LFTs as of 9/26: T. Bili 0.6/ AST 30/ ALT 24/ ALP 50  Suspected splenic vein thrombosis, chronic -Seen on CT A/P 9/15.  Portal vein was patent. - Seen again on CT A/P 9/25: Chronic thrombosis of the splenic vein.    Plan: NJT was not successful, only placed to proximal duodenum with hopes peristalsis/gravity will take tube further down stream. With 2 prior attempts being unsuccessful, suspect this attempt will be similar.  Will need to be on TPN at least until he is able to eat enough to meet metabolic needs.  would not anticoagulate in the setting of necrotizing pancreatitis due to significant concern of inducing hemorrhagic pancreatitis.  [facility] will follow  Garnette Scheuermann PA-C 09/06/2021, 8:21 AM  Contact #  [phone]

## 2021-09-06 NOTE — Progress Notes (Signed)
PHARMACY - TOTAL PARENTERAL NUTRITION CONSULT NOTE   Indication:  NPO for necrotizing pancreatitis  Patient Measurements: Height: 5' 10"  (177.8 cm) Weight: 67.1 kg (147 lb 14.4 oz) IBW/kg (Calculated) : 73 TPN AdjBW (KG): 71.9 Body mass index is 21.22 kg/m. Usual Weight: 80 kg  Assessment: 17 yoM re-admitted for necrotizing pancreatitis and is NPO.  Initially planning for post-pyloric tube feeds, but unable to advance NGT d/t significant duodenal stricture from pancreatic abscess and surrounding edema. Pharmacy consulted to manage TPN.  Glucose / Insulin: No hx DM. CBG goal <180 -BG range: 124-150 -mSSI q12h; 4 units required/24 hrs Electrolytes: Na (144) improved with max concentration in TPN. All other lytes are WNL, including CorrCa (10.08) Renal: SCr, BUN, bicarb stable & WNL Hepatic: LFTs, T.bili, Alk Phos and TG all WNL (9/26) I/O: Strict I/O ordered -UOP 950 mL; no stool documented [facility]ter[facility] GI Imaging: - 9/15 CTa/p: progressive necrotizing pancreatitis w/ 8 cm suspected abscess at pancreatic head, multiple other loculated collections along pancreatic body; common hepatic duct dilation w/ persistent gallstones in GB. - 9/17 UGI: minimal passage of contrast past duodenal stricture, likely d/t mass effect from pancreatic abscess/edema -9/25: CT a/p finding d/w necrotizing pancreatitis, relatively stable pancreatic/peripancreatic necrotic collection within the tail of the pancreas extending into the lesser sac without development of walled-off necrosis at this time. GI Surgeries / Procedures:  - 8/29: Pancreatic stent placed - 9/16: NG tube placed - 9/23: NGT placed at proximal descending duodenum by IR - 9/26: NGT came out  Central access: Triple lumen PICC TPN start date: 9/19  Nutritional Goals:  per RD Assessment (08/28/21):  Kcal: 1800 - 2000 Protein: 85 - 95 g Fluid: 2 L/day  Current TPN formulation at goal rate of 80 mL/hr provides 92 g protein, 1886 kcals, and  288 g dextrose.   Current Nutrition:  NPO, TPN  Significant Event: Difficulty placing NGT to be able to start TF.  9/23: NGT was advanced by IR, but is now back in stomach - likely due to ongoing pancreatic inflammation per GI.  9/26: TPN was disconnected for contrast administration CT last PM (only 1 site for IV access) and unable to restart due to infection control policy.  Currently has D10 running at 80 ml/hr infusing.  Therefore, AM labs do not reflect electrolyte from TPN formula yesterday. - NGT came out. 9/27: TPN resumed last PM. GI plan noted for re-attempt at NJT placement by IR.  If unsuccessful, will need to continue TPN for a least a couple of weeks.  Plan:   At 1800:  Continue TPN at goal rate 80 mL/hr  Electrolytes in TPN:  Na - 125 mEq/L (decrease) K - 25 mEq/L Ca - 3 mEq/L Mg - 5 mEq/L Phos - 15 mmol/L Cl:Ac ratio 1:2 Add standard MVI and trace elements to TPN Continue Moderate q12h SSI and adjust as needed  No current MIVF; further orders per MD Monitor TPN labs on Mon/Thurs  Peggyann Juba, PharmD, BCPS Pharmacy: [phone] 09/06/21 7:09 AM

## 2021-09-06 NOTE — Plan of Care (Signed)
Hunter Ward was up to the chair this morning with SBA, no nausea. He ambulated in the hall later in the morning and vomited ~134ml green, bilious emesis. Compazine given with good effect. Hunter Ward visited w/ family in the afternoon and worked with PT, walking in the halls later in the day. He is requiring IV diluadid every 4 hrs for pain management and still struggles with nausea between his doses of zofran and compazine.  ASL interpreter used via ipad for interactions.  Problem: Education: Goal: Knowledge of General Education information will improve Description: Including pain rating scale, medication(s)/side effects and non-pharmacologic comfort measures Outcome: Progressing   Problem: Health Behavior/Discharge Planning: Goal: Ability to manage health-related needs will improve Outcome: Progressing   Problem: Clinical Measurements: Goal: Ability to maintain clinical measurements within normal limits will improve Outcome: Progressing Goal: Will remain free from infection Outcome: Progressing Note: Afebrile, WBC WNL, IV abx Goal: Diagnostic test results will improve Outcome: Progressing Goal: Respiratory complications will improve Outcome: Progressing Goal: Cardiovascular complication will be avoided Outcome: Progressing   Problem: Activity: Goal: Risk for activity intolerance will decrease Outcome: Progressing Note: PT consulted. Up to chair and ambulating in hall w/ RW and SBA   Problem: Nutrition: Goal: Adequate nutrition will be maintained Outcome: Progressing Note: NPO, on TPN Problem: Coping: Goal: Level of anxiety will decrease Outcome: Progressing   Problem: Elimination: Goal: Will not experience complications related to bowel motility Outcome: Progressing Goal: Will not experience complications related to urinary retention Outcome: Progressing   Problem: Pain Managment: Goal: General experience of comfort will improve Outcome: Progressing Note: IV dilaudid for abd  pain with moderate effect.    Problem: Safety: Goal: Ability to remain free from injury will improve Outcome: Progressing   Problem: Skin Integrity: Goal: Risk for impaired skin integrity will decrease Outcome: Progressing

## 2021-09-06 NOTE — Plan of Care (Signed)

## 2021-09-06 NOTE — Evaluation (Signed)
Physical Therapy Evaluation Patient Details Name: Hunter Ward MRN: [id number] DOB: May 29, 1951 Today's Date: 09/06/2021  History of Present Illness  70 yo male admitted with necrotizing pancreatitis. hx of deafness  Clinical Impression  On eval, pt was Min guard-Min A for mobility. He walked ~125 feet in the hallway with a RW then ~15'x2, to /from bathroom, without an AD. Pt tolerated activity well. Will plan to follow and progress activity as tolerated.        Recommendations for follow up therapy are one component of a multi-disciplinary discharge planning process, led by the attending physician.  Recommendations may be updated based on patient status, additional functional criteria and insurance authorization.  Follow Up Recommendations Home health PT    Equipment Recommendations   (continuing to assess)    Recommendations for Other Services       Precautions / Restrictions Precautions Precautions: Fall Restrictions Weight Bearing Restrictions: No      Mobility  Bed Mobility Overal bed mobility: Modified Independent                  Transfers Overall transfer level: Needs assistance Equipment used: Rolling walker (2 wheeled);None Transfers: Sit to/from Stand Sit to Stand: Supervision         General transfer comment: Supv for safety, lines.  Ambulation/Gait Ambulation/Gait assistance: Min guard;Min assist Gait Distance (Feet): 125 Feet Assistive device: Rolling walker (2 wheeled);None Gait Pattern/deviations: Step-through pattern;Decreased stride length     General Gait Details: Min guard A with RW, Min A without AD. Pt tolerated distance well.  Stairs            Wheelchair Mobility    Modified Rankin (Stroke Patients Only)       Balance Overall balance assessment: Needs assistance           Standing balance-Leahy Scale: Fair                               Pertinent Vitals/Pain Pain Assessment: Faces Faces Pain  Scale: Hurts a little bit Pain Location: abdomen Pain Descriptors / Indicators: Discomfort;Sore Pain Intervention(s): Monitored during session    Home Living Family/patient expects to be discharged to:: Private residence                      Prior Function Level of Independence: Independent               Hand Dominance        Extremity/Trunk Assessment   Upper Extremity Assessment Upper Extremity Assessment: Overall WFL for tasks assessed    Lower Extremity Assessment Lower Extremity Assessment: Generalized weakness    Cervical / Trunk Assessment Cervical / Trunk Assessment: Normal  Communication   Communication: No difficulties  Cognition Arousal/Alertness: Awake/alert Behavior During Therapy: WFL for tasks assessed/performed Overall Cognitive Status: Within Functional Limits for tasks assessed                                        General Comments      Exercises     Assessment/Plan    PT Assessment Patient needs continued PT services  PT Problem List Decreased strength;Decreased mobility;Decreased activity tolerance;Decreased balance;Decreased knowledge of use of DME;Pain       PT Treatment Interventions DME instruction;Gait training;Therapeutic exercise;Balance training;Functional mobility training;Therapeutic activities;Patient/family education    PT  Goals (Current goals can be found in the Care Plan section)  Acute Rehab PT Goals Patient Stated Goal: none stated PT Goal Formulation: With patient Time For Goal Achievement: 09/20/21 Potential to Achieve Goals: Good    Frequency Min 3X/week   Barriers to discharge        Co-evaluation               AM-PAC PT "6 Clicks" Mobility  Outcome Measure Help needed turning from your back to your side while in a flat bed without using bedrails?: None Help needed moving from lying on your back to sitting on the side of a flat bed without using bedrails?: None Help  needed moving to and from a bed to a chair (including a wheelchair)?: A Little Help needed standing up from a chair using your arms (e.g., wheelchair or bedside chair)?: A Little Help needed to walk in hospital room?: A Little Help needed climbing 3-5 steps with a railing? : A Little 6 Click Score: 20    End of Session   Activity Tolerance: Patient tolerated treatment well Patient left: in bed;with call bell/phone within reach   PT Visit Diagnosis: Pain;Muscle weakness (generalized) (M62.81)    Time: 0630-1601 PT Time Calculation (min) (ACUTE ONLY): 19 min   Charges:   PT Evaluation $PT Eval Moderate Complexity: 1 Mod           Doreatha Massed, PT Acute Rehabilitation  Office: [phone] Pager: [phone]

## 2021-09-06 NOTE — Progress Notes (Signed)
PROGRESS NOTE    Hunter Ward  EGB:[id number] DOB: 08/05/1951 DOA: 08/24/2021 PCP: Gaynelle Arabian, MD   Brief Narrative: [name] is a 70 y.o. male with history of hypertension, deafness, hypertension.  Patient presented secondary to worsening abdominal pain, nausea, vomiting.  On admission patient found to have evidence of progressive necrotizing pancreatitis.  GI consulted for management.  Attempt for enteral nutrition failed.  TPN started.  Anticipate prolonged hospitalization.   Assessment & Plan:   Principal Problem:   Necrotizing pancreatitis Active Problems:   HTN (hypertension)   Normocytic anemia   Hyponatremia   Mild protein malnutrition (HCC)   Deafness   Necrotizing pancreatitis Progressive.  Complicated by suspected abscess as well.  Patient started empirically on Zosyn IV.  Gastroenterology consulted.  IR consulted for consideration of drain placement however this was unable to be performed.  Multiple attempts to advance a nasojejunal feeding tube to allow for enteric feeds have failed in achieving placement in jejunum.  PICC line was placed and patient was started on TPN. -Continue TPN per pharmacy protocol -GI recommendations: Leave NG tube in place with hopeful spontaneous advancement to jejunum  Suspected splenic vein thrombosis Appears to be chronic.  Recommendation for no anticoagulation in setting of necrotizing pancreatitis and possible transformation to hemorrhagic pancreatitis.  Primary hypertension Patient is on lisinopril, nifedipine as an outpatient.  Antihypertensives held on admission.  Patient with soft blood pressures while admitted.  Blood pressure is adequately controlled.  Deafness Communication via ASL interpreter  Hyponatremia Resolved  Hypophosphatemia Repleted  Normocytic anemia Prior baseline of about 14.  Drift down to about 10 and currently stable.  No evidence of bleeding.  Constipation Resolved with bowel  regimen. -Continue Dulcolax suppository as needed   DVT prophylaxis: Lovenox Code Status:   Code Status: Full Code Family Communication: Sister and niece at bedside Disposition Plan: Discharge pending ability to advance oral diet in addition to GI recommendations   Consultants:  [facility]  Procedures:  NG TUBE PLACEMENT PICC LINE PLACEMENT  Antimicrobials: Zosyn IV    Subjective:  ASL interpreter: Leandrew Koyanagi  Episode of emesis today. No other issues  Objective: Vitals:   09/05/21 2021 09/06/21 0506 09/06/21 1202 09/06/21 1510  BP: 105/67 112/66 123/72   Pulse: 60 63 60   Resp:   17   Temp: 98.2 F (36.8 C) 98 F (36.7 C) (!) 97.5 F (36.4 C)   TempSrc: Oral Oral Oral   SpO2: 98% 98% 99% 99%  Weight:      Height:        Intake/Output Summary (Last 24 hours) at 09/06/2021 1724 Last data filed at 09/06/2021 1400 Gross per 24 hour  Intake 1996.48 ml  Output 1175 ml  Net 821.48 ml   Filed Weights   08/27/21 1529 08/28/21 0626 09/04/21 1209  Weight: 73.2 kg 77.2 kg 67.1 kg    Examination:  General exam: Appears calm and comfortable Respiratory system: Clear to auscultation. Respiratory effort normal. Cardiovascular system: S1 & S2 heard, RRR. No murmurs, rubs, gallops or clicks. Gastrointestinal system: Abdomen is nondistended, soft and mildly tender in LUQ/RUQ/RLQ.  Normal bowel sounds heard. Central nervous system: Alert and oriented. No focal neurological deficits. Musculoskeletal: No edema. No calf tenderness Skin: No cyanosis. No rashes Psychiatry: Judgement and insight appear normal. Mood & affect appropriate.     Data Reviewed: I have personally reviewed following labs and imaging studies  CBC Lab Results  Component Value Date   WBC 6.9  09/05/2021   RBC 3.22 (L) 09/05/2021   HGB 10.2 (L) 09/05/2021   HCT 31.5 (L) 09/05/2021   MCV 97.8 09/05/2021   MCH 31.7 09/05/2021   PLT 209 09/05/2021   MCHC 32.4 09/05/2021   RDW 13.4  09/05/2021   LYMPHSABS 1.0 08/29/2021   MONOABS 0.6 08/29/2021   EOSABS 0.0 08/29/2021   BASOSABS 0.0 62/70/3500     Last metabolic panel Lab Results  Component Value Date   NA 144 09/06/2021   K 4.2 09/06/2021   CL 110 09/06/2021   CO2 30 09/06/2021   BUN 19 09/06/2021   CREATININE 0.72 09/06/2021   GLUCOSE 135 (H) 09/06/2021   GFRNONAA >60 09/06/2021   CALCIUM 8.8 (L) 09/06/2021   PHOS 3.2 09/05/2021   PROT 6.2 (L) 09/04/2021   ALBUMIN 2.4 (L) 09/04/2021   BILITOT 0.6 09/04/2021   ALKPHOS 50 09/04/2021   AST 30 09/04/2021   ALT 24 09/04/2021   ANIONGAP 4 (L) 09/06/2021    CBG (last 3)  Recent Labs    09/05/21 0849 09/05/21 2023 09/06/21 0736  GLUCAP 124* 150* 117*     GFR: Estimated Creatinine Clearance: 80.4 mL/min (by C-G formula based on SCr of 0.72 mg/dL).  Coagulation Profile: No results for input(s): INR, PROTIME in the last 168 hours.  No results found for this or any previous visit (from the past 240 hour(s)).      Radiology Studies: DG Loyce Dys Tube Plc W/Fl W/Rad  Result Date: 09/05/2021 CLINICAL DATA:  Feeding tube placement EXAM: NASO G TUBE PLACEMENT WITH FL AND WITH RAD FLUOROSCOPY TIME:  Fluoroscopy Time:  2 minutes 18 seconds Radiation Exposure Index (if provided by the fluoroscopic device): 36.4 mGy Number of Acquired Spot Images: 0 COMPARISON:  None. FINDINGS: Feeding tube was placed using fluoroscopic guidance. This was placed transpyloric, but again met resistance in the proximal duodenum, and is similar spot as prior study. This could not be advanced further. Therefore, redundant tubing was looped in the stomach in hopes gravity or peristalsis will carry the tube further downstream, but the tip currently is located in the proximal duodenum. IMPRESSION: Feeding tube tip was placed in the proximal duodenum, but could not be advanced further, the same as prior attempts. Redundant catheter was placed in the stomach in hopes peristalsis or gravity  will take the tube further downstream. Electronically Signed   By: Rolm Baptise M.D.   On: 09/05/2021 17:04        Scheduled Meds:  Chlorhexidine Gluconate Cloth  6 each Topical Daily   enoxaparin (LOVENOX) injection  40 mg Subcutaneous Daily   insulin aspart  0-15 Units Subcutaneous Q12H   pantoprazole (PROTONIX) IV  40 mg Intravenous Q24H   Continuous Infusions:  piperacillin-tazobactam (ZOSYN)  IV 3.375 g (09/06/21 1145)   TPN ADULT (ION) 80 mL/hr at 09/05/21 1841   TPN ADULT (ION)       LOS: 12 days     Cordelia Poche, MD [facility] 09/06/2021, 5:24 PM  If 7PM-7AM, please contact night-coverage [email or web address]

## 2021-09-06 NOTE — Plan of Care (Signed)
  Problem: Clinical Measurements: Goal: Will remain free from infection Outcome: Progressing   Problem: Pain Managment: Goal: General experience of comfort will improve Outcome: Progressing   Problem: Safety: Goal: Ability to remain free from injury will improve Outcome: Progressing   Problem: Nutrition: Goal: Adequate nutrition will be maintained Outcome: Not Progressing

## 2021-09-07 DIAGNOSIS — H9190 Unspecified hearing loss, unspecified ear: Secondary | ICD-10-CM | POA: Diagnosis not present

## 2021-09-07 DIAGNOSIS — E871 Hypo-osmolality and hyponatremia: Secondary | ICD-10-CM | POA: Diagnosis not present

## 2021-09-07 DIAGNOSIS — I10 Essential (primary) hypertension: Secondary | ICD-10-CM | POA: Diagnosis not present

## 2021-09-07 DIAGNOSIS — K8591 Acute pancreatitis with uninfected necrosis, unspecified: Secondary | ICD-10-CM | POA: Diagnosis not present

## 2021-09-07 LAB — COMPREHENSIVE METABOLIC PANEL
ALT: 20 U/L (ref 0–44)
AST: 20 U/L (ref 15–41)
Albumin: 2.4 g/dL — ABNORMAL LOW (ref 3.5–5.0)
Alkaline Phosphatase: 40 U/L (ref 38–126)
Anion gap: 3 — ABNORMAL LOW (ref 5–15)
BUN: 20 mg/dL (ref 8–23)
CO2: 29 mmol/L (ref 22–32)
Calcium: 8.9 mg/dL (ref 8.9–10.3)
Chloride: 111 mmol/L (ref 98–111)
Creatinine, Ser: 0.71 mg/dL (ref 0.61–1.24)
GFR, Estimated: >60 mL/min (ref >60)
Glucose, Bld: 126 mg/dL — ABNORMAL HIGH (ref 70–99)
Potassium: 3.9 mmol/L (ref 3.5–5.1)
Sodium: 143 mmol/L (ref 135–145)
Total Bilirubin: 0.5 mg/dL (ref 0.3–1.2)
Total Protein: 6.3 g/dL — ABNORMAL LOW (ref 6.5–8.1)

## 2021-09-07 LAB — GLUCOSE, CAPILLARY: Glucose-Capillary: 134 mg/dL — ABNORMAL HIGH (ref 70–99)

## 2021-09-07 LAB — MAGNESIUM: Magnesium: 2 mg/dL (ref 1.7–2.4)

## 2021-09-07 LAB — PHOSPHORUS: Phosphorus: 3.3 mg/dL (ref 2.5–4.6)

## 2021-09-07 MED ORDER — TRAVASOL 10 % IV SOLN
INTRAVENOUS | Status: AC
  Filled 2021-09-07: qty 921.6

## 2021-09-07 NOTE — Progress Notes (Signed)
PROGRESS NOTE    Hunter Ward  UUV:[id number] DOB: Jul 06, 1951 DOA: 08/24/2021 PCP: Gaynelle Arabian, MD   Brief Narrative: Hunter Ward is a 70 y.o. male with history of hypertension, deafness, hypertension.  Patient presented secondary to worsening abdominal pain, nausea, vomiting.  On admission patient found to have evidence of progressive necrotizing pancreatitis.  GI consulted for management.  Attempt for enteral nutrition failed.  TPN started.  Anticipate prolonged hospitalization.   Assessment & Plan:   Principal Problem:   Necrotizing pancreatitis Active Problems:   HTN (hypertension)   Normocytic anemia   Hyponatremia   Mild protein malnutrition (HCC)   Deafness   Necrotizing pancreatitis Progressive.  Complicated by suspected abscess as well.  Patient started empirically on Zosyn IV.  Gastroenterology consulted.  IR consulted for consideration of drain placement however this was unable to be performed.  Multiple attempts to advance a nasojejunal feeding tube to allow for enteric feeds have failed in achieving placement in jejunum.  PICC line was placed and patient was started on TPN. -Continue TPN per pharmacy protocol -GI recommendations: Leave NG tube in place with hopeful spontaneous advancement to jejunum; abdominal x-ray in AM  Suspected splenic vein thrombosis Appears to be chronic.  Recommendation for no anticoagulation in setting of necrotizing pancreatitis and possible transformation to hemorrhagic pancreatitis.  Nausea/vomiting -Continue Compazine, Zofran prn  Primary hypertension Patient is on lisinopril, nifedipine as an outpatient.  Antihypertensives held on admission.  Patient with soft blood pressures while admitted.  Blood pressure is adequately controlled.  Deafness Communication via ASL interpreter  Hyponatremia Resolved  Hypophosphatemia Repleted  Normocytic anemia Prior baseline of about 14.  Drift down to about 10 and currently stable.   No evidence of bleeding.  Constipation Resolved with bowel regimen. -Continue Dulcolax suppository as needed   DVT prophylaxis: Lovenox Code Status:   Code Status: Full Code Family Communication: None at bedside Disposition Plan: Discharge pending ability to advance oral diet in addition to GI recommendations   Consultants:  [facility]  Procedures:  NG TUBE PLACEMENT PICC LINE PLACEMENT  Antimicrobials: Zosyn IV    Subjective:  ASL interpreter: Rosaland Lao  No emesis so far today but states yesterday he vomited while sitting down after ambulation. Continues to have   Objective: Vitals:   09/06/21 1202 09/06/21 1510 09/06/21 2013 09/07/21 0458  BP: 123/72  102/65 109/66  Pulse: 60  (!) 55 60  Resp: 17  18   Temp: (!) 97.5 F (36.4 C)  98.1 F (36.7 C) 98.1 F (36.7 C)  TempSrc: Oral  Oral Oral  SpO2: 99% 99% 100% 98%  Weight:      Height:        Intake/Output Summary (Last 24 hours) at 09/07/2021 1120 Last data filed at 09/07/2021 0900 Gross per 24 hour  Intake 1490.08 ml  Output 800 ml  Net 690.08 ml    Filed Weights   08/27/21 1529 08/28/21 0626 09/04/21 1209  Weight: 73.2 kg 77.2 kg 67.1 kg    Examination:  General exam: Appears calm and comfortable Respiratory system: Clear to auscultation. Respiratory effort normal. Cardiovascular system: S1 & S2 heard, RRR. No murmurs, rubs, gallops or clicks. Gastrointestinal system: Abdomen is nondistended, soft and mostly diffusely mildly tender. No organomegaly or masses felt. Normal bowel sounds heard. Central nervous system: Alert and oriented. No focal neurological deficits. Musculoskeletal: No calf tenderness Skin: No cyanosis. No rashes Psychiatry: Judgement and insight appear normal. Mood & affect appropriate.  Data Reviewed: I have personally reviewed following labs and imaging studies  CBC Lab Results  Component Value Date   WBC 6.9 09/05/2021   RBC 3.22 (L) 09/05/2021    HGB 10.2 (L) 09/05/2021   HCT 31.5 (L) 09/05/2021   MCV 97.8 09/05/2021   MCH 31.7 09/05/2021   PLT 209 09/05/2021   MCHC 32.4 09/05/2021   RDW 13.4 09/05/2021   LYMPHSABS 1.0 08/29/2021   MONOABS 0.6 08/29/2021   EOSABS 0.0 08/29/2021   BASOSABS 0.0 69/67/8938     Last metabolic panel Lab Results  Component Value Date   NA 143 09/07/2021   K 3.9 09/07/2021   CL 111 09/07/2021   CO2 29 09/07/2021   BUN 20 09/07/2021   CREATININE 0.71 09/07/2021   GLUCOSE 126 (H) 09/07/2021   GFRNONAA >60 09/07/2021   CALCIUM 8.9 09/07/2021   PHOS 3.3 09/07/2021   PROT 6.3 (L) 09/07/2021   ALBUMIN 2.4 (L) 09/07/2021   BILITOT 0.5 09/07/2021   ALKPHOS 40 09/07/2021   AST 20 09/07/2021   ALT 20 09/07/2021   ANIONGAP 3 (L) 09/07/2021    CBG (last 3)  Recent Labs    09/06/21 0736 09/06/21 2014 09/07/21 0722  GLUCAP 117* 120* 134*      GFR: Estimated Creatinine Clearance: 80.4 mL/min (by C-G formula based on SCr of 0.71 mg/dL).  Coagulation Profile: No results for input(s): INR, PROTIME in the last 168 hours.  No results found for this or any previous visit (from the past 240 hour(s)).      Radiology Studies: DG Loyce Dys Tube Plc W/Fl W/Rad  Result Date: 09/05/2021 CLINICAL DATA:  Feeding tube placement EXAM: NASO G TUBE PLACEMENT WITH FL AND WITH RAD FLUOROSCOPY TIME:  Fluoroscopy Time:  2 minutes 18 seconds Radiation Exposure Index (if provided by the fluoroscopic device): 36.4 mGy Number of Acquired Spot Images: 0 COMPARISON:  None. FINDINGS: Feeding tube was placed using fluoroscopic guidance. This was placed transpyloric, but again met resistance in the proximal duodenum, and is similar spot as prior study. This could not be advanced further. Therefore, redundant tubing was looped in the stomach in hopes gravity or peristalsis will carry the tube further downstream, but the tip currently is located in the proximal duodenum. IMPRESSION: Feeding tube tip was placed in the  proximal duodenum, but could not be advanced further, the same as prior attempts. Redundant catheter was placed in the stomach in hopes peristalsis or gravity will take the tube further downstream. Electronically Signed   By: Rolm Baptise M.D.   On: 09/05/2021 17:04        Scheduled Meds:  Chlorhexidine Gluconate Cloth  6 each Topical Daily   enoxaparin (LOVENOX) injection  40 mg Subcutaneous Daily   pantoprazole (PROTONIX) IV  40 mg Intravenous Q24H   Continuous Infusions:  piperacillin-tazobactam (ZOSYN)  IV 3.375 g (09/07/21 0359)   TPN ADULT (ION) 80 mL/hr at 09/07/21 0400   TPN ADULT (ION)       LOS: 13 days     Cordelia Poche, MD [facility] 09/07/2021, 11:20 AM  If 7PM-7AM, please contact night-coverage [email or web address]

## 2021-09-07 NOTE — Plan of Care (Signed)

## 2021-09-07 NOTE — Progress Notes (Signed)
[facility] Gastroenterology Progress Note  Hunter Ward 70 y.o. 01/12/1951  CC:  Necrotizing pancreatitis   Subjective: Patient reports improvement in nausea, no vomiting today.  Abdominal pain currently controlled with Dilaudid.  Had a BM yesterday.  Encounter conducted with [employer] video interpreting services (Joy [phone])  ROS : Review of Systems  Cardiovascular:  Negative for chest pain and palpitations.  Gastrointestinal:  Positive for abdominal pain. Negative for blood in stool, constipation, diarrhea, melena, nausea and vomiting.     Objective: Vital signs in last 24 hours: Vitals:   09/06/21 2013 09/07/21 0458  BP: 102/65 109/66  Pulse: (!) 55 60  Resp: 18   Temp: 98.1 F (36.7 C) 98.1 F (36.7 C)  SpO2: 100% 98%    Physical Exam:  General:  Alert, cooperative, no distress  Head:  Normocephalic, without obvious abnormality, atraumatic  Eyes:  Anicteric sclera, EOMs intact  Lungs:   Clear to auscultation bilaterally, respirations unlabored  Heart:  Regular rate and rhythm, S1, S2 normal  Abdomen:   Soft and non-distended with moderate epigastic and left upper abdominal tenderness to palpation, no peritoneal signs, normoactive bowel sounds     Lab Results: Recent Labs    09/05/21 0339 09/06/21 0316 09/07/21 0551  NA 136 144 143  K 4.2 4.2 3.9  CL 102 110 111  CO2 28 30 29   GLUCOSE 150* 135* 126*  BUN 14 19 20   CREATININE 0.58* 0.72 0.71  CALCIUM 8.5* 8.8* 8.9  MG 2.2  --  2.0  PHOS 3.2  --  3.3    Recent Labs    09/07/21 0551  AST 20  ALT 20  ALKPHOS 40  BILITOT 0.5  PROT 6.3*  ALBUMIN 2.4*    Recent Labs    09/05/21 0339  WBC 6.9  HGB 10.2*  HCT 31.5*  MCV 97.8  PLT 209    No results for input(s): LABPROT, INR in the last 72 hours.    Assessment: Necrotizing pancreatitis  -Repeat CT 9/25: Relatively stable pancreatic/peripancreatic necrotic collection within the tail of the pancreas extending into the lesser sac without development  of  walled-off necrosis at this time.  Stable moderate extrahepatic biliary ductal dilation with marked tapering of the extrahepatic bile duct within the pancreatic head  -Leukocytosis has resolved, WBCs 6.9 as of 9/27 -Normal renal function: BUN 20/ Cr 0.71 -LFTs remain normal -Albumin 2.4  Suspected splenic vein thrombosis, chronic   Plan: Repeat abdominal films tomorrow to determine if feeding tube has migrated into jejunum.  If it has migrated to jejunum, we will start tube feeds.  If not, recommend removal of feeding tube with trial of PO clears (then advance slowly as tolerated).  Continue NPO status for now.  Continue TPN and supportive care.  [facility] will follow.  Salley Slaughter PA-C 09/07/2021, 10:59 AM  Contact #  [phone]

## 2021-09-07 NOTE — Progress Notes (Signed)
Mobility Specialist - Progress Note    09/07/21 1145  Therapy Vitals  Temp 98.2 F (36.8 C)  Temp Source Oral  Pulse Rate 61  Resp 17  BP 127/73  Patient Position (if appropriate) Lying  Oxygen Therapy  SpO2 100 %  Mobility  Activity Ambulated in hall  Level of Assistance Standby assist, set-up cues, supervision of patient - no hands on  Assistive Device Other (Comment) (IV Pole)  Distance Ambulated (ft) 210 ft  Mobility Ambulated with assistance in hallway  Mobility Response Tolerated well  Mobility performed by Mobility specialist  $Mobility charge 1 Mobility   Pt was agreeable to ambulate and requested to use IV pole to help with mobility. Pt ambulated 210 ft in hallway and did not c/o of pain, SOB, or dizziness. Pt returned to bed after ambulation and reported edema on his feet and stated it felt worse than yesterday and requested for new socks. Pt was left in bed with alarm on, call bell at side, family in room, and given new socks. RN informed of session.   [place] Specialist Acute Rehabilitation Services Phone: [phone] 09/07/21, 11:49 AM

## 2021-09-07 NOTE — Progress Notes (Signed)
PHARMACY - TOTAL PARENTERAL NUTRITION CONSULT NOTE   Indication:  NPO for necrotizing pancreatitis  Patient Measurements: Height: 5' 10"  (177.8 cm) Weight: 67.1 kg (147 lb 14.4 oz) IBW/kg (Calculated) : 73 TPN AdjBW (KG): 71.9 Body mass index is 21.22 kg/m. Usual Weight: 80 kg  Assessment: 68 yoM re-admitted for necrotizing pancreatitis and is NPO.  Initially planning for post-pyloric tube feeds, but unable to advance NGT d/t significant duodenal stricture from pancreatic abscess and surrounding edema. Pharmacy consulted to manage TPN.  Glucose / Insulin: No hx DM. CBG goal <180 -BG range: 120-134 -mSSI q12h; no SSI required in the past 36 hrs Electrolytes: All lytes are WNL, including CorrCa (10.18) Renal: SCr, BUN, bicarb stable & WNL Hepatic: LFTs, T.bili, Alk Phos and TG all WNL (9/26) I/O: Strict I/O ordered -UOP 900 mL; 2 stool documented yesterdy GI Imaging: - 9/15 CTa/p: progressive necrotizing pancreatitis w/ 8 cm suspected abscess at pancreatic head, multiple other loculated collections along pancreatic body; common hepatic duct dilation w/ persistent gallstones in GB. - 9/17 UGI: minimal passage of contrast past duodenal stricture, likely d/t mass effect from pancreatic abscess/edema -9/25: CT a/p finding d/w necrotizing pancreatitis, relatively stable pancreatic/peripancreatic necrotic collection within the tail of the pancreas extending into the lesser sac without development of walled-off necrosis at this time. GI Surgeries / Procedures:  - 8/29: Pancreatic stent placed - 9/16: NG tube placed - 9/23: NGT placed at proximal descending duodenum by IR - 9/26: NGT came out - 9/27: Dobhoff tube placement by IR  Central access: Triple lumen PICC TPN start date: 9/19  Nutritional Goals:  per RD Assessment (08/28/21):  Kcal: 1800 - 2000 Protein: 85 - 95 g Fluid: 2 L/day  Current TPN formulation at goal rate of 80 mL/hr provides 92 g protein, 1886 kcals, and 288 g  dextrose.   Current Nutrition:  NPO, TPN  Significant Event: Difficulty placing NGT to be able to start TF.  9/23: NGT was advanced by IR, but is now back in stomach - likely due to ongoing pancreatic inflammation per GI.  9/26: TPN was disconnected for contrast administration CT last PM (only 1 site for IV access) and unable to restart due to infection control policy.  Currently has D10 running at 80 ml/hr infusing.  Therefore, AM labs do not reflect electrolyte from TPN formula yesterday. - NGT came out. 9/27: TPN resumed last PM. GI plan noted for re-attempt at NJT placement by IR.  If unsuccessful, will need to continue TPN for a least a couple of weeks. 9/28: Plan per GI is to leave NGT in place, repeat AXR 9/30, if still in proximal duodenum then no utility keeping in place and will remove.  Will need to stay on TPN until able to tolerate at least soft diet.  Plan:   At 1800:  Continue TPN at goal rate 80 mL/hr  Electrolytes in TPN:  Na - 125 mEq/L  K - 25 mEq/L Ca - 3 mEq/L Mg - 5 mEq/L Phos - 15 mmol/L Cl:Ac ratio 1:2 Add standard MVI and trace elements to TPN Can d/c CBG check since they have been well-controlled, if AM glucose rises, will re-institute  No current MIVF; further orders per MD Monitor TPN labs on Mon/Thurs  Peggyann Juba, PharmD, [place]: [phone] 09/07/21 7:20 AM

## 2021-09-08 ENCOUNTER — Inpatient Hospital Stay (HOSPITAL_COMMUNITY): Payer: Medicare Other

## 2021-09-08 DIAGNOSIS — E871 Hypo-osmolality and hyponatremia: Secondary | ICD-10-CM | POA: Diagnosis not present

## 2021-09-08 DIAGNOSIS — H9190 Unspecified hearing loss, unspecified ear: Secondary | ICD-10-CM | POA: Diagnosis not present

## 2021-09-08 DIAGNOSIS — K8591 Acute pancreatitis with uninfected necrosis, unspecified: Secondary | ICD-10-CM | POA: Diagnosis not present

## 2021-09-08 DIAGNOSIS — I10 Essential (primary) hypertension: Secondary | ICD-10-CM | POA: Diagnosis not present

## 2021-09-08 LAB — GLUCOSE, CAPILLARY: Glucose-Capillary: 123 mg/dL — ABNORMAL HIGH (ref 70–99)

## 2021-09-08 MED ORDER — TRAVASOL 10 % IV SOLN
INTRAVENOUS | Status: AC
  Filled 2021-09-08: qty 921.6

## 2021-09-08 NOTE — Progress Notes (Signed)
PHARMACY - TOTAL PARENTERAL NUTRITION CONSULT NOTE   Indication:  NPO for necrotizing pancreatitis  Patient Measurements: Height: 5' 10"  (177.8 cm) Weight: 67.1 kg (147 lb 14.4 oz) IBW/kg (Calculated) : 73 TPN AdjBW (KG): 71.9 Body mass index is 21.22 kg/m. Usual Weight: 80 kg  Assessment: 20 yoM re-admitted for necrotizing pancreatitis and is NPO.  Initially planning for post-pyloric tube feeds, but unable to advance NGT d/t significant duodenal stricture from pancreatic abscess and surrounding edema. Pharmacy consulted to manage TPN.  Glucose / Insulin: No hx DM. CBG goal <180 -CBGs/SSI stopped 9/29 as they have been well-controlled Labs 9/29: Electrolytes: All lytes are WNL, including CorrCa (10.18) Renal: SCr, BUN, bicarb stable & WNL Hepatic: LFTs, T.bili, Alk Phos and TG all WNL (9/26) I/O: Strict I/O ordered -UOP 300 mL, 5 occurences; 1 stool documented yesterdy GI Imaging: - 9/15 CTa/p: progressive necrotizing pancreatitis w/ 8 cm suspected abscess at pancreatic head, multiple other loculated collections along pancreatic body; common hepatic duct dilation w/ persistent gallstones in GB. - 9/17 UGI: minimal passage of contrast past duodenal stricture, likely d/t mass effect from pancreatic abscess/edema -9/25: CT a/p finding d/w necrotizing pancreatitis, relatively stable pancreatic/peripancreatic necrotic collection within the tail of the pancreas extending into the lesser sac without development of walled-off necrosis at this time. GI Surgeries / Procedures:  - 8/29: Pancreatic stent placed - 9/16: NG tube placed - 9/23: NGT placed at proximal descending duodenum by IR - 9/26: NGT came out - 9/27: Dobhoff tube placement by IR - 9/30: Plan AXR today to evaluate position of NGT  Central access: Triple lumen PICC TPN start date: 9/19  Nutritional Goals:  per RD Assessment (08/28/21):  Kcal: 1800 - 2000 Protein: 85 - 95 g Fluid: 2 L/day  Current TPN formulation at  goal rate of 80 mL/hr provides 92 g protein, 1886 kcals, and 288 g dextrose.   Current Nutrition:  NPO, TPN  Significant Event: Difficulty placing NGT to be able to start TF.  9/23: NGT was advanced by IR, but is now back in stomach - likely due to ongoing pancreatic inflammation per GI.  9/26: TPN was disconnected for contrast administration CT last PM (only 1 site for IV access) and unable to restart due to infection control policy.  Currently has D10 running at 80 ml/hr infusing.  Therefore, AM labs do not reflect electrolyte from TPN formula yesterday. - NGT came out. 9/27: TPN resumed last PM. GI plan noted for re-attempt at NJT placement by IR.  If unsuccessful, will need to continue TPN for a least a couple of weeks. 9/28: Plan per GI is to leave NGT in place, repeat AXR 9/30, if still in proximal duodenum then no utility keeping in place and will remove.  Will need to stay on TPN until able to tolerate at least soft diet.  Plan:   At 1800:  Continue TPN at goal rate 80 mL/hr  Electrolytes in TPN:  Na - 125 mEq/L  K - 25 mEq/L Ca - 3 mEq/L Mg - 5 mEq/L Phos - 15 mmol/L Cl:Ac ratio 1:2 Add standard MVI and trace elements to TPN Continue no CBG check since they have been well-controlled, if AM glucose rises, will re-institute  No current MIVF; further orders per MD Monitor TPN labs on Mon/Thurs, BMET in AM  Hunter Ward, PharmD, [place]: [phone] 09/08/21 7:20 AM

## 2021-09-08 NOTE — Progress Notes (Signed)
   09/08/21 1300  Mobility  Activity Ambulated in hall  Level of Assistance Contact guard assist, steadying assist  Assistive Device Front wheel walker  Distance Ambulated (ft) 120 ft  Mobility Ambulated with assistance in hallway  Mobility Response Tolerated well  Mobility performed by Mobility specialist  $Mobility charge 1 Mobility   Pt agreeable to mobilizing today. Ambulated in the hall about 146ft with RW, tolerated well. Pt communicated through ASL or witting. Left pt in bed with call bell at side. RN notified of session.    [place] Specialist Acute Re[facility] Services Office: [phone]

## 2021-09-08 NOTE — Progress Notes (Signed)
[facility] Gastroenterology Progress Note  Hunter Ward 70 y.o. [birth year]  CC:  Necrotizing pancreatitis   Subjective: Patient reports mild nausea without vomiting.  Reports his abdomen continues to be "sore." Had a small BM last night.  ROS : Review of Systems  Cardiovascular:  Negative for chest pain and palpitations.  Gastrointestinal:  Positive for abdominal pain and nausea. Negative for blood in stool, constipation, diarrhea, melena and vomiting.     Objective: Vital signs in last 24 hours: Vitals:   09/07/21 2009 09/08/21 0601  BP: 121/69 119/70  Pulse: (!) 56 (!) 58  Resp: 18 18  Temp: 98 F (36.7 C) 98.2 F (36.8 C)  SpO2: 100% 97%    Physical Exam:  General:  Alert, cooperative, no distress  Head:  Normocephalic, without obvious abnormality, atraumatic  Eyes:  Anicteric sclera, EOMs intact  Lungs:   Clear to auscultation bilaterally, respirations unlabored  Heart:  Regular rate and rhythm, S1, S2 normal  Abdomen:   Soft and non-distended with mild epigastric and left upper abdominal tenderness to palpation, no peritoneal signs, normoactive bowel sounds     Lab Results: Recent Labs    09/06/21 0316 09/07/21 0551  NA 144 143  K 4.2 3.9  CL 110 111  CO2 30 29  GLUCOSE 135* 126*  BUN 19 20  CREATININE 0.72 0.71  CALCIUM 8.8* 8.9  MG  --  2.0  PHOS  --  3.3    Recent Labs    09/07/21 0551  AST 20  ALT 20  ALKPHOS 40  BILITOT 0.5  PROT 6.3*  ALBUMIN 2.4*    No results for input(s): WBC, NEUTROABS, HGB, HCT, MCV, PLT in the last 72 hours.  No results for input(s): LABPROT, INR in the last 72 hours.    Assessment: Necrotizing pancreatitis  -Repeat CT 9/25: Relatively stable pancreatic/peripancreatic necrotic collection within the tail of the pancreas extending into the lesser sac without development of  walled-off necrosis at this time.  Stable moderate extrahepatic biliary ductal dilation with marked tapering of the extrahepatic bile duct  within the pancreatic head  -Leukocytosis has resolved, WBCs 6.9 as of 9/27 -Normal renal function: BUN 20/ Cr 0.71 -LFTs remain normal -Albumin 2.4 -Abdominal x-ray today: weighted tip feeding tube now either in the gastric pylorus or first portion of the duodenum. Based on current position may pass spontaneously into the small bowel. Would recommend no further manipulation with repeat assessment to determine whether this has passed distally. If not, could consider attempt at advancement.  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  If tube has not migrated at that time, would recommend removal of feeding tube with trial of clears, advancing as tolerated.  Chips/sips OK.  Continue TPN and supportive care.  [facility] will follow.  Salley Slaughter PA-C 09/08/2021, 1:09 PM  Contact #  [phone]

## 2021-09-08 NOTE — Progress Notes (Signed)
Dr. Lonny Prude at bedside and spoke with patient using interpretor for sign language. Patient allowed to ask questions and participate in care.

## 2021-09-08 NOTE — Progress Notes (Signed)
PROGRESS NOTE    Hunter Ward  JHE:[id number] DOB: [birth year] DOA: 08/24/2021 PCP: Gaynelle Arabian, MD   Brief Narrative: Hunter Ward is a 70 y.o. male with history of hypertension, deafness, hypertension.  Patient presented secondary to worsening abdominal pain, nausea, vomiting.  On admission patient found to have evidence of progressive necrotizing pancreatitis.  GI consulted for management.  Attempt for enteral nutrition failed.  TPN started.  Anticipate prolonged hospitalization.   Assessment & Plan:   Principal Problem:   Necrotizing pancreatitis Active Problems:   HTN (hypertension)   Normocytic anemia   Hyponatremia   Mild protein malnutrition (HCC)   Deafness   Necrotizing pancreatitis Progressive.  Complicated by suspected abscess as well.  Patient started empirically on Zosyn IV.  Gastroenterology consulted.  IR consulted for consideration of drain placement however this was unable to be performed.  Multiple attempts to advance a nasojejunal feeding tube to allow for enteric feeds have failed in achieving placement in jejunum.  PICC line was placed and patient was started on TPN. Abdominal x-ray from 9/30 significant for feeding tube tip in proximal duodenum -Continue TPN per pharmacy protocol -GI recommendations: sips with meds, leave feeding tube in until Monday  Suspected splenic vein thrombosis Appears to be chronic.  Recommendation for no anticoagulation in setting of necrotizing pancreatitis and possible transformation to hemorrhagic pancreatitis.  Nausea/vomiting -Continue Compazine, Zofran prn  Primary hypertension Patient is on lisinopril, nifedipine as an outpatient.  Antihypertensives held on admission.  Patient with soft blood pressures while admitted.  Blood pressure is adequately controlled.  Deafness Communication via ASL interpreter  Hyponatremia Resolved  Hypophosphatemia Repleted  Normocytic anemia Prior baseline of about 14.  Drift  down to about 10 and currently stable.  No evidence of bleeding.  Constipation Resolved with bowel regimen. -Continue Dulcolax suppository as needed   DVT prophylaxis: Lovenox Code Status:   Code Status: Full Code Family Communication: None at bedside Disposition Plan: Discharge pending ability to advance oral diet in addition to GI recommendations   Consultants:  [facility]  Procedures:  NG TUBE PLACEMENT PICC LINE PLACEMENT  Antimicrobials: Zosyn IV    Subjective:  ASL interpreter used  No recurrent emesis. Some nausea. Happy with ice chips but hoping for some soup. Some lower abdominal pain.  Objective: Vitals:   09/07/21 1145 09/07/21 2009 09/08/21 0601 09/08/21 1359  BP: 127/73 121/69 119/70 125/75  Pulse: 61 (!) 56 (!) 58 (!) 58  Resp: 17 18 18 18   Temp: 98.2 F (36.8 C) 98 F (36.7 C) 98.2 F (36.8 C) 98.2 F (36.8 C)  TempSrc: Oral Oral Oral Oral  SpO2: 100% 100% 97% 98%  Weight:      Height:        Intake/Output Summary (Last 24 hours) at 09/08/2021 1626 Last data filed at 09/08/2021 1600 Gross per 24 hour  Intake 1639.62 ml  Output 550 ml  Net 1089.62 ml    Filed Weights   08/27/21 1529 08/28/21 0626 09/04/21 1209  Weight: 73.2 kg 77.2 kg 67.1 kg    Examination:  General exam: Appears calm and comfortable Respiratory system: Clear to auscultation. Respiratory effort normal. Cardiovascular system: S1 & S2 heard, RRR. No murmurs, rubs, gallops or clicks. Gastrointestinal system: Abdomen is nondistended, soft and tender in lower quadrants. No organomegaly or masses felt. Normal bowel sounds heard. Central nervous system: Alert and oriented. No focal neurological deficits. Musculoskeletal: No edema. No calf tenderness Skin: No cyanosis. No rashes Psychiatry: Judgement and  insight appear normal. Mood & affect appropriate.     Data Reviewed: I have personally reviewed following labs and imaging studies  CBC Lab Results   Component Value Date   WBC 6.9 09/05/2021   RBC 3.22 (L) 09/05/2021   HGB 10.2 (L) 09/05/2021   HCT 31.5 (L) 09/05/2021   MCV 97.8 09/05/2021   MCH 31.7 09/05/2021   PLT 209 09/05/2021   MCHC 32.4 09/05/2021   RDW 13.4 09/05/2021   LYMPHSABS 1.0 08/29/2021   MONOABS 0.6 08/29/2021   EOSABS 0.0 08/29/2021   BASOSABS 0.0 27/74/1287     Last metabolic panel Lab Results  Component Value Date   NA 143 09/07/2021   K 3.9 09/07/2021   CL 111 09/07/2021   CO2 29 09/07/2021   BUN 20 09/07/2021   CREATININE 0.71 09/07/2021   GLUCOSE 126 (H) 09/07/2021   GFRNONAA >60 09/07/2021   CALCIUM 8.9 09/07/2021   PHOS 3.3 09/07/2021   PROT 6.3 (L) 09/07/2021   ALBUMIN 2.4 (L) 09/07/2021   BILITOT 0.5 09/07/2021   ALKPHOS 40 09/07/2021   AST 20 09/07/2021   ALT 20 09/07/2021   ANIONGAP 3 (L) 09/07/2021    CBG (last 3)  Recent Labs    09/06/21 2014 09/07/21 0722 09/08/21 0754  GLUCAP 120* 134* 123*      GFR: Estimated Creatinine Clearance: 80.4 mL/min (by C-G formula based on SCr of 0.71 mg/dL).  Coagulation Profile: No results for input(s): INR, PROTIME in the last 168 hours.  No results found for this or any previous visit (from the past 240 hour(s)).      Radiology Studies: DG Abd 1 View  Result Date: 09/08/2021 CLINICAL DATA:  NG tube placement. EXAM: ABDOMEN - 1 VIEW COMPARISON:  September 02, 2021. FINDINGS: Since the previous radiograph there has been exchange of a weighted tip feeding tube, current feeding tube tip now in the pylorus or first portion of duodenum directed distally with mild redundant looping in the proximal stomach. Pancreatic stent remains in place. Contrast has passed into the colon from previous contrasted evaluations. Lung bases are clear. Mild gaseous distension of the colon is unchanged. No acute skeletal process on limited assessment. IMPRESSION: Weighted tip feeding tube now either in the gastric pylorus or first portion of the duodenum.  Based on current position may pass spontaneously into the small bowel. Would recommend no further manipulation with repeat assessment to determine whether this has passed distally. If not, could consider attempt at advancement. Electronically Signed   By: Zetta Bills M.D.   On: 09/08/2021 13:03        Scheduled Meds:  Chlorhexidine Gluconate Cloth  6 each Topical Daily   enoxaparin (LOVENOX) injection  40 mg Subcutaneous Daily   pantoprazole (PROTONIX) IV  40 mg Intravenous Q24H   Continuous Infusions:  TPN ADULT (ION) 80 mL/hr at 09/08/21 1500   TPN ADULT (ION)       LOS: 14 days     Cordelia Poche, MD [facility] 09/08/2021, 4:26 PM  If 7PM-7AM, please contact night-coverage [email or web address]

## 2021-09-09 DIAGNOSIS — K8591 Acute pancreatitis with uninfected necrosis, unspecified: Secondary | ICD-10-CM | POA: Diagnosis not present

## 2021-09-09 LAB — BASIC METABOLIC PANEL
Anion gap: 4 — ABNORMAL LOW (ref 5–15)
BUN: 19 mg/dL (ref 8–23)
CO2: 29 mmol/L (ref 22–32)
Calcium: 8.7 mg/dL — ABNORMAL LOW (ref 8.9–10.3)
Chloride: 108 mmol/L (ref 98–111)
Creatinine, Ser: 0.66 mg/dL (ref 0.61–1.24)
GFR, Estimated: >60 mL/min (ref >60)
Glucose, Bld: 127 mg/dL — ABNORMAL HIGH (ref 70–99)
Potassium: 3.8 mmol/L (ref 3.5–5.1)
Sodium: 141 mmol/L (ref 135–145)

## 2021-09-09 LAB — GLUCOSE, CAPILLARY
Glucose-Capillary: 120 mg/dL — ABNORMAL HIGH (ref 70–99)
Glucose-Capillary: 112 mg/dL — ABNORMAL HIGH (ref 70–99)
Glucose-Capillary: 117 mg/dL — ABNORMAL HIGH (ref 70–99)

## 2021-09-09 MED ORDER — TRAVASOL 10 % IV SOLN
INTRAVENOUS | Status: AC
  Filled 2021-09-09: qty 921.6

## 2021-09-09 NOTE — Progress Notes (Signed)
Lisa Roca 10:57 AM  Subjective: Patient looks better than his story sounds in his hospital computer chart reviewed and his case discussed with my partner Dr. Paulita Fujita and he is seemingly tolerating tube feeds and has no new complaints  Objective: Vital signs stable afebrile no acute distress abdomen is soft minimal upper discomfort only no guarding or rebound chemistries okay  Assessment: Necrotizing pancreatitis currently stable on tube feeds  Plan: Please call me this weekend if any question or problem otherwise we will asked team to check on early next week and at some point will need an endoscopy to remove pancreatic stent and if needed could try to place feeding tube endoscopically at that time [facility]  office [phone] After 5PM or if no answer call [phone]

## 2021-09-09 NOTE — Progress Notes (Signed)
PROGRESS NOTE    Hunter Ward  VHQ:[id number] DOB: [birth year] DOA: 08/24/2021 PCP: Gaynelle Arabian, MD   Brief Narrative: Hunter Ward is a 70 y.o. male with history of hypertension, deafness, hypertension.  Patient presented secondary to worsening abdominal pain, nausea, vomiting.  On admission patient found to have evidence of progressive necrotizing pancreatitis.  GI consulted for management.  Attempt for enteral nutrition failed.  TPN started.  Anticipate prolonged hospitalization.   Assessment & Plan:   Principal Problem:   Necrotizing pancreatitis Active Problems:   HTN (hypertension)   Normocytic anemia   Hyponatremia   Mild protein malnutrition (HCC)   Deafness   Necrotizing pancreatitis Progressive.  Complicated by suspected abscess as well.  Patient started empirically on Zosyn IV.  Gastroenterology consulted.  IR consulted for consideration of drain placement however this was unable to be performed.  Multiple attempts to advance a nasojejunal feeding tube to allow for enteric feeds have failed in achieving placement in jejunum.  PICC line was placed and patient was started on TPN. 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  Recommendation for no anticoagulation in setting of necrotizing pancreatitis and possible transformation to hemorrhagic pancreatitis.  Nausea/vomiting -Continue Compazine, Zofran prn  Primary hypertension Patient is on lisinopril, nifedipine as an outpatient.  Antihypertensives held on admission.  Patient with soft blood pressures while admitted.  Blood pressure is adequately controlled.  Deafness Communication via ASL  interpreter  Hyponatremia Resolved  Hypophosphatemia Repleted  Normocytic anemia Prior baseline of about 14.  Drift down to about 10 and currently stable.  No evidence of bleeding.  Constipation Resolved with bowel regimen. -Continue Dulcolax suppository as needed   DVT prophylaxis: Lovenox Code Status:   Code Status: Full Code Family Communication: None at bedside Disposition Plan: Discharge pending ability to advance oral diet in addition to GI recommendations   Consultants:  [facility]  Procedures:  NG TUBE PLACEMENT PICC LINE PLACEMENT  Antimicrobials: Zosyn IV    Subjective:  No issues noted overnight  Objective: Vitals:   09/08/21 1359 09/08/21 2013 09/09/21 0439 09/09/21 1334  BP: 125/75 118/71 118/68 112/70  Pulse: (!) 58 (!) 55 (!) 58 (!) 57  Resp: 18 16 18 16   Temp: 98.2 F (36.8 C) 97.9 F (36.6 C) 98.1 F (36.7 C) 98.4 F (36.9 C)  TempSrc: Oral  Oral Oral  SpO2: 98% 99% 99% 98%  Weight:      Height:        Intake/Output Summary (Last 24 hours) at 09/09/2021 1416 Last data filed at 09/09/2021 0813 Gross per 24 hour  Intake 1806.21 ml  Output 1325 ml  Net 481.21 ml    Filed Weights   08/27/21 1529 08/28/21 0626 09/04/21 1209  Weight: 73.2 kg 77.2 kg 67.1 kg    Examination:  General: Well appearing, no distress    Data Reviewed: I have personally reviewed following labs and imaging studies  CBC Lab Results  Component Value Date   WBC 6.9 09/05/2021   RBC 3.22 (L) 09/05/2021   HGB 10.2 (L) 09/05/2021   HCT 31.5 (L) 09/05/2021   MCV 97.8 09/05/2021   MCH 31.7 09/05/2021   PLT 209 09/05/2021   MCHC 32.4 09/05/2021   RDW 13.4 09/05/2021  LYMPHSABS 1.0 08/29/2021   MONOABS 0.6 08/29/2021   EOSABS 0.0 08/29/2021   BASOSABS 0.0 35/00/9381     Last metabolic panel Lab Results  Component Value Date   NA 141 09/09/2021   K 3.8 09/09/2021   CL 108 09/09/2021   CO2 29 09/09/2021   BUN 19 09/09/2021    CREATININE 0.66 09/09/2021   GLUCOSE 127 (H) 09/09/2021   GFRNONAA >60 09/09/2021   CALCIUM 8.7 (L) 09/09/2021   PHOS 3.3 09/07/2021   PROT 6.3 (L) 09/07/2021   ALBUMIN 2.4 (L) 09/07/2021   BILITOT 0.5 09/07/2021   ALKPHOS 40 09/07/2021   AST 20 09/07/2021   ALT 20 09/07/2021   ANIONGAP 4 (L) 09/09/2021    CBG (last 3)  Recent Labs    09/08/21 0754 09/09/21 0708 09/09/21 1203  GLUCAP 123* 120* 112*      GFR: Estimated Creatinine Clearance: 80.4 mL/min (by C-G formula based on SCr of 0.66 mg/dL).  Coagulation Profile: No results for input(s): INR, PROTIME in the last 168 hours.  No results found for this or any previous visit (from the past 240 hour(s)).      Radiology Studies: DG Abd 1 View  Result Date: 09/08/2021 CLINICAL DATA:  NG tube placement. EXAM: ABDOMEN - 1 VIEW COMPARISON:  September 02, 2021. FINDINGS: Since the previous radiograph there has been exchange of a weighted tip feeding tube, current feeding tube tip now in the pylorus or first portion of duodenum directed distally with mild redundant looping in the proximal stomach. Pancreatic stent remains in place. Contrast has passed into the colon from previous contrasted evaluations. Lung bases are clear. Mild gaseous distension of the colon is unchanged. No acute skeletal process on limited assessment. IMPRESSION: Weighted tip feeding tube now either in the gastric pylorus or first portion of the duodenum. Based on current position may pass spontaneously into the small bowel. Would recommend no further manipulation with repeat assessment to determine whether this has passed distally. If not, could consider attempt at advancement. Electronically Signed   By: Zetta Bills M.D.   On: 09/08/2021 13:03        Scheduled Meds:  Chlorhexidine Gluconate Cloth  6 each Topical Daily   enoxaparin (LOVENOX) injection  40 mg Subcutaneous Daily   pantoprazole (PROTONIX) IV  40 mg Intravenous Q24H   Continuous  Infusions:  TPN ADULT (ION) 80 mL/hr at 09/08/21 1800   TPN ADULT (ION)       LOS: 15 days     Cordelia Poche, MD [facility] 09/09/2021, 2:16 PM  If 7PM-7AM, please contact night-coverage [email or web address]

## 2021-09-09 NOTE — Progress Notes (Signed)
PHARMACY - TOTAL PARENTERAL NUTRITION CONSULT NOTE   Indication:  NPO for necrotizing pancreatitis  Patient Measurements: Height: 5' 10"  (177.8 cm) Weight: 67.1 kg (147 lb 14.4 oz) IBW/kg (Calculated) : 73 TPN AdjBW (KG): 71.9 Body mass index is 21.22 kg/m. Usual Weight: 80 kg  Assessment: 65 yoM re-admitted for necrotizing pancreatitis and is NPO.  Initially planning for post-pyloric tube feeds, but unable to advance NGT d/t significant duodenal stricture from pancreatic abscess and surrounding edema. Pharmacy consulted to manage TPN.  Glucose / Insulin: No hx DM. CBG goal <180 -CBGs/SSI stopped 9/29 as they have been well-controlled Labs 9/29: Electrolytes: All lytes are WNL, including CorrCa (10) Renal: SCr, BUN, bicarb stable & WNL Hepatic: LFTs, T.bili, Alk Phos and TG all WNL (9/26) I/O: Strict I/O ordered -UOP 300 mL, 5 occurences; 1 stool documented yesterdy GI Imaging: - 9/15 CTa/p: progressive necrotizing pancreatitis w/ 8 cm suspected abscess at pancreatic head, multiple other loculated collections along pancreatic body; common hepatic duct dilation w/ persistent gallstones in GB. - 9/17 UGI: minimal passage of contrast past duodenal stricture, likely d/t mass effect from pancreatic abscess/edema -9/25: CT a/p finding d/w necrotizing pancreatitis, relatively stable pancreatic/peripancreatic necrotic collection within the tail of the pancreas extending into the lesser sac without development of walled-off necrosis at this time. GI Surgeries / Procedures:  - 8/29: Pancreatic stent placed - 9/16: NG tube placed - 9/23: NGT placed at proximal descending duodenum by IR - 9/26: NGT came out - 9/27: Dobhoff tube placement by IR - 9/30: Plan AXR today to evaluate position of NGT  Central access: Triple lumen PICC TPN start date: 9/19  Nutritional Goals:  per RD Assessment (08/28/21):  Kcal: 1800 - 2000 Protein: 85 - 95 g Fluid: 2 L/day  Current TPN formulation at goal  rate of 80 mL/hr provides 92 g protein, 1886 kcals, and 288 g dextrose.   Current Nutrition:  NPO, TPN  Significant Event: Difficulty placing NGT to be able to start TF.  9/23: NGT was advanced by IR, but is now back in stomach - likely due to ongoing pancreatic inflammation per GI.  9/26: TPN was disconnected for contrast administration CT last PM (only 1 site for IV access) and unable to restart due to infection control policy.  Currently has D10 running at 80 ml/hr infusing.  Therefore, AM labs do not reflect electrolyte from TPN formula yesterday. - NGT came out. 9/27: TPN resumed last PM. GI plan noted for re-attempt at NJT placement by IR.  If unsuccessful, will need to continue TPN for a least a couple of weeks. 9/28: Plan per GI is to leave NGT in place, repeat AXR 9/30, if still in proximal duodenum then no utility keeping in place and will remove.  Will need to stay on TPN until able to tolerate at least soft diet.  Plan:   At 1800:  Continue TPN at goal rate 80 mL/hr  Electrolytes in TPN:  Na - 125 mEq/L  K - 25 mEq/L Ca - 3 mEq/L Mg - 5 mEq/L Phos - 15 mmol/L Cl:Ac ratio 1:2 Add standard MVI and trace elements to TPN Continue no CBG check since they have been well-controlled, if AM glucose rises, will re-institute  No current MIVF; further orders per MD Monitor TPN labs on Mon/Thurs  Napoleon Form  09/09/21 10:30 AM

## 2021-09-09 NOTE — Plan of Care (Signed)
  Problem: Nutrition: Goal: Adequate nutrition will be maintained Outcome: Progressing   

## 2021-09-09 NOTE — Progress Notes (Signed)
Physical Therapy Treatment Patient Details Name: Hunter Ward MRN: [id number] DOB: Apr 18, 1951 Today's Date: 09/09/2021   History of Present Illness Hunter Ward is a 70 yo male admitted with necrotizing pancreatitis. hx of deafness, HTN    PT Comments    Pt tolerates increased ambulation distance without pain, SOB or dizziness complaints. Pt ran into object with IV pole once, able to correct without physical assist, no LOB. Pt able to perform seated BLE strengthening exercises without complaints, VC for motor control and decreased momentum.    Recommendations for follow up therapy are one component of a multi-disciplinary discharge planning process, led by the attending physician.  Recommendations may be updated based on patient status, additional functional criteria and insurance authorization.  Follow Up Recommendations  Home health PT     Equipment Recommendations  Other (comment) (continuing to assess)    Recommendations for Other Services       Precautions / Restrictions Precautions Precautions: Fall Restrictions Weight Bearing Restrictions: No     Mobility  Bed Mobility Overal bed mobility: Modified Independent   Transfers Overall transfer level: Needs assistance Equipment used: None Transfers: Sit to/from Stand Sit to Stand: Supervision  General transfer comment: supv for safety wtih IV line  Ambulation/Gait Ambulation/Gait assistance: Supervision Gait Distance (Feet): 180 Feet Assistive device: IV Pole Gait Pattern/deviations: Step-through pattern;Decreased stride length Gait velocity: slightly decreased   General Gait Details: pt propels IV pole, runs into 1 object but able to self correct, step through pattern with slightly decreased cadence, no LOB, denies pain, dizziness, SOB   Stairs             Wheelchair Mobility    Modified Rankin (Stroke Patients Only)       Balance Overall balance assessment: No apparent balance deficits (not  formally assessed)         Cognition Arousal/Alertness: Awake/alert Behavior During Therapy: WFL for tasks assessed/performed Overall Cognitive Status: Within Functional Limits for tasks assessed       Exercises General Exercises - Lower Extremity Long Arc Quad: Seated;AROM;Strengthening;Both;20 reps Hip Flexion/Marching: Seated;AROM;Strengthening;Both;20 reps Toe Raises: Seated;AROM;Strengthening;Both;20 reps Heel Raises: Seated;AROM;Strengthening;Both;20 reps    General Comments        Pertinent Vitals/Pain Pain Assessment: Faces Faces Pain Scale: Hurts a little bit Pain Location: abdomen Pain Descriptors / Indicators: Sore Pain Intervention(s): Limited activity within patient's tolerance;Monitored during session;Premedicated before session    Home Living                      Prior Function            PT Goals (current goals can now be found in the care plan section) Acute Rehab PT Goals Patient Stated Goal: none stated PT Goal Formulation: With patient Time For Goal Achievement: 09/20/21 Potential to Achieve Goals: Good Progress towards PT goals: Progressing toward goals    Frequency    Min 3X/week      PT Plan Current plan remains appropriate    Co-evaluation              AM-PAC PT "6 Clicks" Mobility   Outcome Measure  Help needed turning from your back to your side while in a flat bed without using bedrails?: None Help needed moving from lying on your back to sitting on the side of a flat bed without using bedrails?: None Help needed moving to and from a bed to a chair (including a wheelchair)?: A Little Help needed standing up from a  chair using your arms (e.g., wheelchair or bedside chair)?: A Little Help needed to walk in hospital room?: A Little Help needed climbing 3-5 steps with a railing? : A Little 6 Click Score: 20    End of Session Equipment Utilized During Treatment: Gait belt;Other (comment) (interpreter Levada Dy  [phone]) Activity Tolerance: Patient tolerated treatment well Patient left: in bed;with call bell/phone within reach;with bed alarm set Nurse Communication: Mobility status PT Visit Diagnosis: Pain;Muscle weakness (generalized) (M62.81)     Time: [id number] PT Time Calculation (min) (ACUTE ONLY): 18 min  Charges:  $Gait Training: 8-22 mins                      Tori Phi Avans PT, DPT 09/09/21, 12:00 PM

## 2021-09-10 DIAGNOSIS — K8591 Acute pancreatitis with uninfected necrosis, unspecified: Secondary | ICD-10-CM | POA: Diagnosis not present

## 2021-09-10 DIAGNOSIS — I10 Essential (primary) hypertension: Secondary | ICD-10-CM | POA: Diagnosis not present

## 2021-09-10 DIAGNOSIS — E871 Hypo-osmolality and hyponatremia: Secondary | ICD-10-CM | POA: Diagnosis not present

## 2021-09-10 DIAGNOSIS — H9190 Unspecified hearing loss, unspecified ear: Secondary | ICD-10-CM | POA: Diagnosis not present

## 2021-09-10 LAB — GLUCOSE, CAPILLARY
Glucose-Capillary: 112 mg/dL — ABNORMAL HIGH (ref 70–99)
Glucose-Capillary: 115 mg/dL — ABNORMAL HIGH (ref 70–99)
Glucose-Capillary: 123 mg/dL — ABNORMAL HIGH (ref 70–99)
Glucose-Capillary: 124 mg/dL — ABNORMAL HIGH (ref 70–99)

## 2021-09-10 MED ORDER — TRAVASOL 10 % IV SOLN
INTRAVENOUS | Status: AC
  Filled 2021-09-10: qty 921.6

## 2021-09-10 NOTE — Progress Notes (Signed)
PHARMACY - TOTAL PARENTERAL NUTRITION CONSULT NOTE   Indication:  NPO for necrotizing pancreatitis  Patient Measurements: Height: 5' 10"  (177.8 cm) Weight: 67.1 kg (147 lb 14.4 oz) IBW/kg (Calculated) : 73 TPN AdjBW (KG): 71.9 Body mass index is 21.22 kg/m. Usual Weight: 80 kg  Assessment: 1 yoM re-admitted for necrotizing pancreatitis and is NPO.  Initially planning for post-pyloric tube feeds, but unable to advance NGT d/t significant duodenal stricture from pancreatic abscess and surrounding edema. Pharmacy consulted to manage TPN.  Glucose / Insulin: No hx DM. CBG goal <180 -CBGs/SSI stopped 9/29 as they have been well-controlled Labs 9/29: Electrolytes: 10/1: All lytes are WNL, including CorrCa (10) Renal: 10/1: SCr, BUN, bicarb stable & WNL Hepatic: LFTs, T.bili, Alk Phos and TG all WNL (9/26) I/O: Strict I/O ordered -UOP 200 mL, uncharted occurrences?, LBM 10/1 GI Imaging: - 9/15 CTa/p: progressive necrotizing pancreatitis w/ 8 cm suspected abscess at pancreatic head, multiple other loculated collections along pancreatic body; common hepatic duct dilation w/ persistent gallstones in GB. - 9/17 UGI: minimal passage of contrast past duodenal stricture, likely d/t mass effect from pancreatic abscess/edema -9/25: CT a/p finding d/w necrotizing pancreatitis, relatively stable pancreatic/peripancreatic necrotic collection within the tail of the pancreas extending into the lesser sac without development of walled-off necrosis at this time. GI Surgeries / Procedures:  - 8/29: Pancreatic stent placed - 9/16: NG tube placed - 9/23: NGT placed at proximal descending duodenum by IR - 9/26: NGT came out - 9/27: Dobhoff tube placement by IR - 9/30: Plan AXR today to evaluate position of NGT  Central access: Triple lumen PICC TPN start date: 9/19  Nutritional Goals:  per RD Assessment (08/28/21):  Kcal: 1800 - 2000 Protein: 85 - 95 g Fluid: 2 L/day  Current TPN formulation at  goal rate of 80 mL/hr provides 92 g protein, 1886 kcals, and 288 g dextrose.   Current Nutrition:  NPO, TPN  Significant Event: Difficulty placing NGT to be able to start TF.  9/23: NGT was advanced by IR, but is now back in stomach - likely due to ongoing pancreatic inflammation per GI.  9/26: TPN was disconnected for contrast administration CT last PM (only 1 site for IV access) and unable to restart due to infection control policy.  Currently has D10 running at 80 ml/hr infusing.  Therefore, AM labs do not reflect electrolyte from TPN formula yesterday. - NGT came out. 9/27: TPN resumed last PM. GI plan noted for re-attempt at NJT placement by IR.  If unsuccessful, will need to continue TPN for a least a couple of weeks. 9/28: Plan per GI is to leave NGT in place, repeat AXR 9/30, if still in proximal duodenum then no utility keeping in place and will remove.  Will need to stay on TPN until able to tolerate at least soft diet.  Plan:   At 1800:  Continue TPN at goal rate 80 mL/hr  Electrolytes in TPN:  Na - 125 mEq/L  K - 25 mEq/L Ca - 3 mEq/L Mg - 5 mEq/L Phos - 15 mmol/L Cl:Ac ratio 1:2 Add standard MVI and trace elements to TPN Continue no CBG check since they have been well-controlled, if AM glucose rises, will re-institute  No current MIVF; further orders per MD Monitor TPN labs on Mon/Thurs  Napoleon Form  09/10/21 10:41 AM

## 2021-09-10 NOTE — Progress Notes (Signed)
PROGRESS NOTE    Hunter Ward  WUJ:[id number] DOB: [birth year] DOA: 08/24/2021 PCP: Gaynelle Arabian, MD   Brief Narrative: Hunter Ward is a 70 y.o. male with history of hypertension, deafness, hypertension.  Patient presented secondary to worsening abdominal pain, nausea, vomiting.  On admission patient found to have evidence of progressive necrotizing pancreatitis.  GI consulted for management.  Attempt for enteral nutrition failed.  TPN started.  Anticipate prolonged hospitalization.   Assessment & Plan:   Principal Problem:   Necrotizing pancreatitis Active Problems:   HTN (hypertension)   Normocytic anemia   Hyponatremia   Mild protein malnutrition (HCC)   Deafness   Necrotizing pancreatitis Progressive.  Complicated by suspected abscess as well.  Patient started empirically on Zosyn IV.  Gastroenterology consulted.  IR consulted for consideration of drain placement however this was unable to be performed.  Multiple attempts to advance a nasojejunal feeding tube to allow for enteric feeds have failed in achieving placement in jejunum.  PICC line was placed and patient was started on TPN. 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  Recommendation for no anticoagulation in setting of necrotizing pancreatitis and possible transformation to hemorrhagic pancreatitis.  Nausea/vomiting -Continue Compazine, Zofran prn  Primary hypertension Patient is on lisinopril, nifedipine as an outpatient.  Antihypertensives held on admission.  Patient with soft blood pressures while admitted.  Blood pressure is adequately controlled.  Deafness Communication via ASL  interpreter  Hyponatremia Resolved  Hypophosphatemia Repleted  Normocytic anemia Prior baseline of about 14.  Drift down to about 10 and currently stable.  No evidence of bleeding.  Constipation Resolved with bowel regimen. -Continue Dulcolax suppository as needed   DVT prophylaxis: Lovenox Code Status:   Code Status: Full Code Family Communication: None at bedside Disposition Plan: Discharge pending ability to advance oral diet in addition to GI recommendations   Consultants:  [facility]  Procedures:  NG TUBE PLACEMENT PICC LINE PLACEMENT  Antimicrobials: Zosyn IV    Subjective:  Interpreter used.  Patient reports having some green/yellow bowel movements. Feeling better overall. Abdominal pain is improved.  Objective: Vitals:   09/09/21 2033 09/10/21 0432 09/10/21 1309 09/10/21 1521  BP: 113/70 126/69 137/75   Pulse: (!) 57 (!) 55 (!) 57   Resp: 16 18 14    Temp: 97.7 F (36.5 C) 97.7 F (36.5 C) 97.9 F (36.6 C)   TempSrc:   Oral   SpO2: 100% 99% 100%   Weight:    67.4 kg  Height:        Intake/Output Summary (Last 24 hours) at 09/10/2021 1631 Last data filed at 09/10/2021 1149 Gross per 24 hour  Intake 189.03 ml  Output 1400 ml  Net -1210.97 ml    Filed Weights   08/28/21 0626 09/04/21 1209 09/10/21 1521  Weight: 77.2 kg 67.1 kg 67.4 kg    Examination:  General exam: Appears calm and comfortable. NG tube in place. Respiratory system: Clear to auscultation. Respiratory effort normal. Cardiovascular system: S1 & S2 heard, RRR. No murmurs, rubs, gallops or clicks. Gastrointestinal system: Abdomen is nondistended, soft and nontender. No organomegaly or masses felt. Normal bowel sounds heard. Central nervous system: Alert and oriented. No focal neurological deficits. Musculoskeletal: No edema. No calf tenderness Skin: No cyanosis.  No rashes Psychiatry: Judgement and insight appear normal. Mood & affect appropriate.     Data  Reviewed: I have personally reviewed following labs and imaging studies  CBC Lab Results  Component Value Date   WBC 6.9 09/05/2021   RBC 3.22 (L) 09/05/2021   HGB 10.2 (L) 09/05/2021   HCT 31.5 (L) 09/05/2021   MCV 97.8 09/05/2021   MCH 31.7 09/05/2021   PLT 209 09/05/2021   MCHC 32.4 09/05/2021   RDW 13.4 09/05/2021   LYMPHSABS 1.0 08/29/2021   MONOABS 0.6 08/29/2021   EOSABS 0.0 08/29/2021   BASOSABS 0.0 70/26/3785     Last metabolic panel Lab Results  Component Value Date   NA 141 09/09/2021   K 3.8 09/09/2021   CL 108 09/09/2021   CO2 29 09/09/2021   BUN 19 09/09/2021   CREATININE 0.66 09/09/2021   GLUCOSE 127 (H) 09/09/2021   GFRNONAA >60 09/09/2021   CALCIUM 8.7 (L) 09/09/2021   PHOS 3.3 09/07/2021   PROT 6.3 (L) 09/07/2021   ALBUMIN 2.4 (L) 09/07/2021   BILITOT 0.5 09/07/2021   ALKPHOS 40 09/07/2021   AST 20 09/07/2021   ALT 20 09/07/2021   ANIONGAP 4 (L) 09/09/2021    CBG (last 3)  Recent Labs    09/09/21 1723 09/10/21 0431 09/10/21 1147  GLUCAP 117* 123* 115*      GFR: Estimated Creatinine Clearance: 80.7 mL/min (by C-G formula based on SCr of 0.66 mg/dL).  Coagulation Profile: No results for input(s): INR, PROTIME in the last 168 hours.  No results found for this or any previous visit (from the past 240 hour(s)).      Radiology Studies: No results found.      Scheduled Meds:  Chlorhexidine Gluconate Cloth  6 each Topical Daily   enoxaparin (LOVENOX) injection  40 mg Subcutaneous Daily   pantoprazole (PROTONIX) IV  40 mg Intravenous Q24H   Continuous Infusions:  TPN ADULT (ION) 80 mL/hr at 09/09/21 1743   TPN ADULT (ION)       LOS: 16 days     Cordelia Poche, MD [facility] 09/10/2021, 4:31 PM  If 7PM-7AM, please contact night-coverage [email or web address]

## 2021-09-11 ENCOUNTER — Encounter (HOSPITAL_COMMUNITY): Payer: Self-pay | Admitting: Internal Medicine

## 2021-09-11 ENCOUNTER — Encounter (HOSPITAL_COMMUNITY)
Admission: EM | Disposition: A | Discharge status: Home Health | Payer: Self-pay | Source: Home / Self Care | Attending: Internal Medicine

## 2021-09-11 ENCOUNTER — Inpatient Hospital Stay (HOSPITAL_COMMUNITY): Payer: Medicare Other | Admitting: Anesthesiology

## 2021-09-11 DIAGNOSIS — K8591 Acute pancreatitis with uninfected necrosis, unspecified: Secondary | ICD-10-CM | POA: Diagnosis not present

## 2021-09-11 DIAGNOSIS — H9190 Unspecified hearing loss, unspecified ear: Secondary | ICD-10-CM | POA: Diagnosis not present

## 2021-09-11 DIAGNOSIS — E871 Hypo-osmolality and hyponatremia: Secondary | ICD-10-CM | POA: Diagnosis not present

## 2021-09-11 DIAGNOSIS — I10 Essential (primary) hypertension: Secondary | ICD-10-CM | POA: Diagnosis not present

## 2021-09-11 HISTORY — PX: ESOPHAGOGASTRODUODENOSCOPY: SHX5428

## 2021-09-11 HISTORY — PX: STENT REMOVAL: SHX6421

## 2021-09-11 LAB — COMPREHENSIVE METABOLIC PANEL
ALT: 15 U/L (ref 0–44)
AST: 16 U/L (ref 15–41)
Albumin: 2.5 g/dL — ABNORMAL LOW (ref 3.5–5.0)
Alkaline Phosphatase: 48 U/L (ref 38–126)
Anion gap: 3 — ABNORMAL LOW (ref 5–15)
BUN: 20 mg/dL (ref 8–23)
CO2: 30 mmol/L (ref 22–32)
Calcium: 8.6 mg/dL — ABNORMAL LOW (ref 8.9–10.3)
Chloride: 105 mmol/L (ref 98–111)
Creatinine, Ser: 0.59 mg/dL — ABNORMAL LOW (ref 0.61–1.24)
GFR, Estimated: >60 mL/min (ref >60)
Glucose, Bld: 103 mg/dL — ABNORMAL HIGH (ref 70–99)
Potassium: 3.8 mmol/L (ref 3.5–5.1)
Sodium: 138 mmol/L (ref 135–145)
Total Bilirubin: 0.5 mg/dL (ref 0.3–1.2)
Total Protein: 6.5 g/dL (ref 6.5–8.1)

## 2021-09-11 LAB — GLUCOSE, CAPILLARY
Glucose-Capillary: 104 mg/dL — ABNORMAL HIGH (ref 70–99)
Glucose-Capillary: 117 mg/dL — ABNORMAL HIGH (ref 70–99)
Glucose-Capillary: 127 mg/dL — ABNORMAL HIGH (ref 70–99)

## 2021-09-11 LAB — PHOSPHORUS: Phosphorus: 3.3 mg/dL (ref 2.5–4.6)

## 2021-09-11 LAB — TRIGLYCERIDES: Triglycerides: 37 mg/dL (ref <150)

## 2021-09-11 LAB — MAGNESIUM: Magnesium: 1.9 mg/dL (ref 1.7–2.4)

## 2021-09-11 SURGERY — EGD (ESOPHAGOGASTRODUODENOSCOPY)
Anesthesia: Monitor Anesthesia Care

## 2021-09-11 MED ORDER — TRAVASOL 10 % IV SOLN
INTRAVENOUS | Status: AC
  Filled 2021-09-11: qty 921.6

## 2021-09-11 MED ORDER — LACTATED RINGERS IV SOLN: INTRAVENOUS | Status: DC | PRN

## 2021-09-11 MED ORDER — ENOXAPARIN SODIUM 40 MG/0.4ML IJ SOSY
40.0000 mg | PREFILLED_SYRINGE | Freq: Every day | INTRAMUSCULAR | Status: DC
  Administered 2021-09-12 – 2021-09-22 (×11): 40 mg via SUBCUTANEOUS
  Filled 2021-09-11 (×11): qty 0.4

## 2021-09-11 MED ORDER — PROPOFOL 500 MG/50ML IV EMUL
INTRAVENOUS | Status: DC | PRN
  Administered 2021-09-11: 100 ug/kg/min via INTRAVENOUS

## 2021-09-11 NOTE — Plan of Care (Signed)
  Problem: Nutrition: Goal: Adequate nutrition will be maintained Outcome: Progressing   

## 2021-09-11 NOTE — Anesthesia Postprocedure Evaluation (Signed)
Anesthesia Post Note  Patient: Hunter Ward  Procedure(s) Performed: ESOPHAGOGASTRODUODENOSCOPY (EGD) PANCREATIC STENT REMOVAL     Patient location during evaluation: Endoscopy Anesthesia Type: MAC Level of consciousness: awake and alert Pain management: pain level controlled Vital Signs Assessment: post-procedure vital signs reviewed and stable Respiratory status: spontaneous breathing, nonlabored ventilation and respiratory function stable Cardiovascular status: blood pressure returned to baseline and stable Postop Assessment: no apparent nausea or vomiting Anesthetic complications: no   No notable events documented.  Last Vitals:  Vitals:   09/11/21 1450 09/11/21 1507  BP: 113/68 117/69  Pulse: (!) 59 (!) 59  Resp: (!) 21 16  Temp:  37 C  SpO2: 99% 100%    Last Pain:  Vitals:   09/11/21 1507  TempSrc: Oral  PainSc:                  Merlinda Frederick

## 2021-09-11 NOTE — Anesthesia Procedure Notes (Signed)
Procedure Name: MAC Date/Time: 09/11/2021 1:30 PM Performed by: Lieutenant Diego, CRNA Pre-anesthesia Checklist: Patient identified, Emergency Drugs available, Suction available, Patient being monitored and Timeout performed Patient Re-evaluated:Patient Re-evaluated prior to induction Oxygen Delivery Method: Nasal cannula Induction Type: IV induction

## 2021-09-11 NOTE — Progress Notes (Addendum)
[facility] Gastroenterology Progress Note  Hunter Ward 70 y.o. 02-Jun-1951  CC:  Necrotizing pancreatitis   Subjective: Patient reports feeling better today. Has a minimal amount of abdominal discomfort. States it feels more like a soreness. States the NG tube is bothering him. Denies nausea/vomiting.  [employer] video interpreting services was utilized for this encounter (ASL, Sasha, # [id number])   ROS : Review of Systems  Gastrointestinal:  Positive for abdominal pain (soreness). Negative for blood in stool, constipation, diarrhea, heartburn, melena, nausea and vomiting.     Objective: Vital signs in last 24 hours: Vitals:   09/10/21 2248 09/11/21 0607  BP: 119/71 120/68  Pulse: (!) 56 (!) 55  Resp: 18 16  Temp: 98.7 F (37.1 C) 98.3 F (36.8 C)  SpO2: 98% 100%    Physical Exam:  General:  Alert, cooperative, no distress, appears stated age  Head:  Normocephalic, without obvious abnormality, atraumatic  Eyes:  Anicteric sclera, EOM's intact  Lungs:   Clear to auscultation bilaterally, respirations unlabored  Heart:  Regular rate and rhythm, S1, S2 normal  Abdomen:   Soft, TTP in epigastric area. Normal bowel sounds.    Lab Results: Recent Labs    09/09/21 0327 09/11/21 0324  NA 141 138  K 3.8 3.8  CL 108 105  CO2 29 30  GLUCOSE 127* 103*  BUN 19 20  CREATININE 0.66 0.59*  CALCIUM 8.7* 8.6*  MG  --  1.9  PHOS  --  3.3   Recent Labs    09/11/21 0324  AST 16  ALT 15  ALKPHOS 48  BILITOT 0.5  PROT 6.5  ALBUMIN 2.5*   No results for input(s): WBC, NEUTROABS, HGB, HCT, MCV, PLT in the last 72 hours. No results for input(s): LABPROT, INR in the last 72 hours.    Assessment Necrotizing pancreatitis -Repeat CT 9/25: Relatively stable pancreatic/peripancreatic necrotic collection within the tail of the pancreas extending into the lesser sac without development of  walled-off necrosis at this time.  Stable moderate extrahepatic biliary ductal dilation with marked  tapering of the extrahepatic bile duct within the pancreatic head  - Triglycerides 37   Plan: Plan for EGD today. I thoroughly discussed the procedures to include nature, alternatives, benefits, and risks including but not limited to bleeding, perforation, infection, anesthesia/cardiac and pulmonary complications. Patient provides understanding and gave verbal consent to proceed.   EGD to advance feeding tube endoscopically.  Continue supportive care. Continue Protonix 40mg  daily.   [facility] will follow.     Garnette Scheuermann PA-C 09/11/2021, 8:19 AM  Contact #  [phone]

## 2021-09-11 NOTE — Op Note (Signed)
[facility] Patient Name: Hunter Ward Procedure Date: 09/11/2021 MRN: [id number] Attending MD: Ronnette Juniper , MD Date of Birth: 01/06/1951 CSN: [id number] Age: 70 Admit Type: Inpatient Procedure:                Upper GI endoscopy Indications:              Pancreatic stent removal, attempt to place the tip                            of feeding scope into the jejunum Providers:                Ronnette Juniper, MD, Truddie Coco, RN, Carmie End, RN Referring MD:             [facility] Medicines:                Monitored Anesthesia Care Complications:            No immediate complications. Estimated blood loss:                            Minimal. Estimated Blood Loss:     Estimated blood loss was minimal. Procedure:                Pre-Anesthesia Assessment:                           - Prior to the procedure, a History and Physical                            was performed, and patient medications and                            allergies were reviewed. The patient's tolerance of                            previous anesthesia was also reviewed. The risks                            and benefits of the procedure and the sedation                            options and risks were discussed with the patient.                            All questions were answered, and informed consent                            was obtained. Prior Anticoagulants: The patient has                            taken no previous anticoagulant or antiplatelet                            agents. ASA Grade Assessment: III - A patient with  severe systemic disease. After reviewing the risks                            and benefits, the patient was deemed in                            satisfactory condition to undergo the procedure.                           After obtaining informed consent, the endoscope was                            passed under direct vision. Throughout the                             procedure, the patient's blood pressure, pulse, and                            oxygen saturations were monitored continuously. The                            [id number]-H190 ([id number] Olympus endoscope was introduced                            through the mouth, and advanced to the jejunum. The                            upper GI endoscopy was accomplished without                            difficulty. The patient tolerated the procedure                            well. Scope In: Scope Out: Findings:      The examined esophagus was normal.      Diffuse moderately erythematous mucosa without bleeding was found in the       cardia, in the gastric fundus, in the gastric body, on the greater       curvature of the stomach, on the lesser curvature of the stomach, in the       gastric antrum and at the pylorus.      The cardia and gastric fundus were normal on retroflexion.      A previously placed plastic pancreatic stent was seen in the ampulla.       Stent removal was accomplished with a rat-toothed forceps. The pathology       specimen was placed into Bottle Number 1. Estimated blood loss was       minimal.      Severe inflammation characterized by congestion (edema), erythema,       friability, mucus and shallow ulcerations and perhaps a small amount of       pus was found in the duodenal bulb and in the first portion of the       duodenum.      The adult gastroscope was otherwise easily advanced beyond this area of       edema into  the duodenum.      The feeding tube was noted coiled in patient's mouth.      Multiple attempts were made to advance the tip of the feeding tube into       the jejunum or at least in the post pyloric area.      This was successful with a snare, however, during withdrawl of the       gastroscope the feeding tube would be pulled back in the stomach and       later in to the esopahgus.      The feeding tube was subsequently removed at the end of  the procedure. Impression:               - Normal esophagus.                           - Erythematous mucosa in the cardia, gastric                            fundus, gastric body, greater curvature, lesser                            curvature, antrum and pylorus.                           - Duodenitis.                           - Plastic pancreatic stent in the duodenum. Removed.                           - Removal of feeding tube,despite mulitple                            attempts, unable to leave the tip in the                            jejunum/post pyloric area. Moderate Sedation:      Patient did not receive moderate sedation for this procedure, but       instead received monitored anesthesia care. Recommendation:           - Clear liquid diet.                           - Continue present medications.                           - Await pathology results/stent- cytology. Procedure Code(s):        --- Professional ---                           [phone], Esophagogastroduodenoscopy, flexible,                            transoral; with removal of foreign body(s) Diagnosis Code(s):        --- Professional ---  K31.89, Other diseases of stomach and duodenum                           K29.80, Duodenitis without bleeding                           Z46.59, Encounter for fitting and adjustment of                            other gastrointestinal appliance and device CPT copyright 2019 [employer]. All rights reserved. The codes documented in this report are preliminary and upon coder review may  be revised to meet current compliance requirements. Ronnette Juniper, MD 09/11/2021 2:31:51 PM This report has been signed electronically. Number of Addenda: 0

## 2021-09-11 NOTE — Progress Notes (Signed)
Assessed patient with assistance from ASL interpretter via [employer] app.

## 2021-09-11 NOTE — Transfer of Care (Signed)
Immediate Anesthesia Transfer of Care Note  Patient: Hunter Ward  Procedure(s) Performed: ESOPHAGOGASTRODUODENOSCOPY (EGD) PANCREATIC STENT REMOVAL  Patient Location: Endoscopy Unit  Anesthesia Type:MAC  Level of Consciousness: drowsy  Airway & Oxygen Therapy: Patient Spontanous Breathing and Patient connected to nasal cannula oxygen  Post-op Assessment: Report given to RN and Post -op Vital signs reviewed and stable  Post vital signs: Reviewed and stable  Last Vitals:  Vitals Value Taken Time  BP 105/70 09/11/21 1420  Temp    Pulse 75 09/11/21 1421  Resp 25 09/11/21 1421  SpO2 95 % 09/11/21 1421  Vitals shown include unvalidated device data.  Last Pain:  Vitals:   09/11/21 1307  TempSrc: Oral  PainSc: 0-No pain      Patients Stated Pain Goal: 0 (41/74/08 1448)  Complications: No notable events documented.

## 2021-09-11 NOTE — Progress Notes (Signed)
PHARMACY - TOTAL PARENTERAL NUTRITION CONSULT NOTE   Indication:  NPO for necrotizing pancreatitis  Patient Measurements: Height: 5' 10"  (177.8 cm) Weight: 67.4 kg (148 lb 9.4 oz) IBW/kg (Calculated) : 73 TPN AdjBW (KG): 71.9 Body mass index is 21.32 kg/m. Usual Weight: 80 kg  Assessment: Hunter Ward re-admitted for necrotizing pancreatitis and is NPO.  Initially planning for post-pyloric tube feeds, but unable to advance NGT d/t significant duodenal stricture from pancreatic abscess and surrounding edema. Pharmacy consulted to manage TPN.  Glucose / Insulin: No hx DM. CBG goal <180 - CBGs/SSI stopped 9/29 as they have been well-controlled Electrolytes: Lytes are WNL, including CorrCa  Renal: SCr, BUN, bicarb stable & WNL Hepatic: LFTs, T.bili, Alk Phos and TG all WNL (10/3) I/O: Strict I/O ordered - adequate UOP 1125 mL, LBM 10/2 GI Imaging: - 9/15 CTa/p: progressive necrotizing pancreatitis w/ 8 cm suspected abscess at pancreatic head, multiple other loculated collections along pancreatic body; common hepatic duct dilation w/ persistent gallstones in GB. - 9/17 UGI: minimal passage of contrast past duodenal stricture, likely d/t mass effect from pancreatic abscess/edema -9/25: CT a/p finding d/w necrotizing pancreatitis, relatively stable pancreatic/peripancreatic necrotic collection within the tail of the pancreas extending into the lesser sac without development of walled-off necrosis at this time. - 9/30 AbXR: Weighted tip feeding tube now either in the gastric pylorus or first portion of the duodenum GI Surgeries / Procedures:  - 8/29: Pancreatic stent placed - 9/16: NG tube placed - 9/23: NGT placed at proximal descending duodenum by IR - 9/26: NGT came out - 9/27: Dobhoff tube placement by IR  Central access: Triple lumen PICC TPN start date: 9/19  Nutritional Goals:  per RD Assessment (08/28/21):  Kcal: 1800 - 2000 Protein: 85 - 95 g Fluid: 2 L/day  Current TPN  formulation at goal rate of 80 mL/hr provides 92 g protein, 1886 kcals, and 288 g dextrose.   Current Nutrition:  NPO, TPN  Significant Event: 9/23: NGT was advanced by IR, but is now back in stomach - likely due to ongoing pancreatic inflammation per GI.  9/26: TPN was disconnected for contrast administration CT last PM (only 1 site for IV access) and unable to restart due to infection control policy.  Currently has D10 running at 80 ml/hr infusing.  Therefore, AM labs do not reflect electrolyte from TPN formula yesterday.  NGT came out. 9/27: TPN resumed last PM.  9/28: Plan per GI is to stay on TPN until able to tolerate at least soft diet.  Plan:  At 1800:  Continue TPN at goal rate 80 mL/hr   Electrolytes in TPN:  Na - 125 mEq/L  K - 25 mEq/L Ca - 3 mEq/L Mg - 5 mEq/L Phos - 15 mmol/L Cl:Ac ratio 1:2 Add standard MVI and trace elements to TPN Continue no CBG check since they have been well-controlled, if AM glucose rises, will re-institute  No current MIVF; further orders per MD Monitor TPN labs on Mon/Thurs    Gretta Arab PharmD, BCPS Clinical Pharmacist [facility] main pharmacy [phone] 09/11/2021 7:56 AM

## 2021-09-11 NOTE — Progress Notes (Signed)
PROGRESS NOTE    Hunter Ward  ZOX:[id number] DOB: Feb 12, 1951 DOA: 08/24/2021 PCP: Gaynelle Arabian, MD   Brief Narrative: Hunter Ward is a 70 y.o. male with history of hypertension, deafness, hypertension.  Patient presented secondary to worsening abdominal pain, nausea, vomiting.  On admission patient found to have evidence of progressive necrotizing pancreatitis.  GI consulted for management.  Attempt for enteral nutrition failed.  TPN started.  Anticipate prolonged hospitalization.   Assessment & Plan:   Principal Problem:   Necrotizing pancreatitis Active Problems:   HTN (hypertension)   Normocytic anemia   Hyponatremia   Mild protein malnutrition (HCC)   Deafness   Necrotizing pancreatitis Progressive.  Complicated by suspected abscess as well.  Patient started empirically on Zosyn IV.  Gastroenterology consulted.  IR consulted for consideration of drain placement however this was unable to be performed.  Multiple attempts to advance a nasojejunal feeding tube to allow for enteric feeds have failed in achieving placement in jejunum.  PICC line was placed and patient was started on TPN. 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  Recommendation for no anticoagulation in setting of necrotizing pancreatitis and possible transformation to hemorrhagic pancreatitis.  Nausea/vomiting -Continue Compazine, Zofran prn  Primary hypertension Patient is on lisinopril, nifedipine as an outpatient.  Antihypertensives held on admission.  Patient with soft blood pressures while admitted.  Blood pressure is adequately controlled.  Deafness Communication via ASL interpreter  Hyponatremia Resolved  Hypophosphatemia Repleted  Normocytic anemia Prior baseline of about 14.  Drift down to about 10  and currently stable.  No evidence of bleeding.  Constipation Resolved with bowel regimen. -Continue Dulcolax suppository as needed   DVT prophylaxis: Lovenox Code Status:   Code Status: Full Code Family Communication: None at bedside Disposition Plan: Discharge pending ability to advance oral diet in addition to GI recommendations   Consultants:  [facility]  Procedures:  NG TUBE PLACEMENT PICC LINE PLACEMENT  Antimicrobials: Zosyn IV    Subjective:  Interpreter used.  No issues this morning. Has episode of emesis when sitting up at times attributed to NG tube.  Objective: Vitals:   09/10/21 1309 09/10/21 1521 09/10/21 2248 09/11/21 0607  BP: 137/75  119/71 120/68  Pulse: (!) 57  (!) 56 (!) 55  Resp: 14  18 16   Temp: 97.9 F (36.6 C)  98.7 F (37.1 C) 98.3 F (36.8 C)  TempSrc: Oral  Oral Oral  SpO2: 100%  98% 100%  Weight:  67.4 kg    Height:        Intake/Output Summary (Last 24 hours) at 09/11/2021 1129 Last data filed at 09/11/2021 0830 Gross per 24 hour  Intake 1892.2 ml  Output 925 ml  Net 967.2 ml    Filed Weights   08/28/21 0626 09/04/21 1209 09/10/21 1521  Weight: 77.2 kg 67.1 kg 67.4 kg    Examination:  General exam: Appears calm and comfortable Respiratory system: Clear to auscultation. Respiratory effort normal. Cardiovascular system: S1 & S2 heard, RRR. No murmurs, rubs, gallops or clicks. Gastrointestinal system: Abdomen is nondistended, soft and nontender. No organomegaly or masses felt. Normal bowel sounds heard. Central nervous system: Alert and oriented. No focal neurological deficits. Musculoskeletal: No edema. No calf tenderness Skin: No cyanosis. No rashes Psychiatry: Judgement and insight appear normal. Mood & affect appropriate.  Data Reviewed: I have personally reviewed following labs and imaging studies  CBC Lab Results  Component Value Date   WBC 6.9 09/05/2021   RBC 3.22 (L) 09/05/2021   HGB 10.2 (L)  09/05/2021   HCT 31.5 (L) 09/05/2021   MCV 97.8 09/05/2021   MCH 31.7 09/05/2021   PLT 209 09/05/2021   MCHC 32.4 09/05/2021   RDW 13.4 09/05/2021   LYMPHSABS 1.0 08/29/2021   MONOABS 0.6 08/29/2021   EOSABS 0.0 08/29/2021   BASOSABS 0.0 10/62/6948     Last metabolic panel Lab Results  Component Value Date   NA 138 09/11/2021   K 3.8 09/11/2021   CL 105 09/11/2021   CO2 30 09/11/2021   BUN 20 09/11/2021   CREATININE 0.59 (L) 09/11/2021   GLUCOSE 103 (H) 09/11/2021   GFRNONAA >60 09/11/2021   CALCIUM 8.6 (L) 09/11/2021   PHOS 3.3 09/11/2021   PROT 6.5 09/11/2021   ALBUMIN 2.5 (L) 09/11/2021   BILITOT 0.5 09/11/2021   ALKPHOS 48 09/11/2021   AST 16 09/11/2021   ALT 15 09/11/2021   ANIONGAP 3 (L) 09/11/2021    CBG (last 3)  Recent Labs    09/10/21 1757 09/10/21 2342 09/11/21 0602  GLUCAP 124* 112* 104*      GFR: Estimated Creatinine Clearance: 80.7 mL/min (A) (by C-G formula based on SCr of 0.59 mg/dL (L)).  Coagulation Profile: No results for input(s): INR, PROTIME in the last 168 hours.  No results found for this or any previous visit (from the past 240 hour(s)).      Radiology Studies: No results found.      Scheduled Meds:  Chlorhexidine Gluconate Cloth  6 each Topical Daily   [START ON 09/12/2021] enoxaparin (LOVENOX) injection  40 mg Subcutaneous Daily   pantoprazole (PROTONIX) IV  40 mg Intravenous Q24H   Continuous Infusions:  TPN ADULT (ION) 80 mL/hr at 09/10/21 1738   TPN ADULT (ION)       LOS: 17 days     Cordelia Poche, MD [facility] 09/11/2021, 11:29 AM  If 7PM-7AM, please contact night-coverage [email or web address]

## 2021-09-11 NOTE — Anesthesia Preprocedure Evaluation (Addendum)
Anesthesia Evaluation  Patient identified by MRN, date of birth, ID band Patient awake    Reviewed: Allergy & Precautions, NPO status , Patient's Chart, lab work & pertinent test results  Airway Mallampati: II  TM Distance: >3 FB Neck ROM: Full    Dental  (+) Edentulous Upper, Poor Dentition Bottom teeth with poor dentition, some decayed/broken:   Pulmonary neg pulmonary ROS,    Pulmonary exam normal breath sounds clear to auscultation       Cardiovascular METS: 3 - Mets hypertension, Pt. on medications Normal cardiovascular exam Rhythm:Regular Rate:Normal  Normal sinus rhythm Low voltage QRS Septal infarct , age undetermined Abnormal ECG Confirmed by Kathlyn Sacramento [phone]) on 08/27/2021 5:06:27 PM   Neuro/Psych deaf negative psych ROS   GI/Hepatic Neg liver ROS, pancreatitis   Endo/Other  negative endocrine ROS  Renal/GU negative Renal ROS  negative genitourinary   Musculoskeletal negative musculoskeletal ROS (+)   Abdominal   Peds negative pediatric ROS (+)  Hematology  (+) Blood dyscrasia, anemia ,   Anesthesia Other Findings   Reproductive/Obstetrics negative OB ROS                             Anesthesia Physical Anesthesia Plan  ASA: 3  Anesthesia Plan: MAC   Post-op Pain Management:    Induction: Intravenous  PONV Risk Score and Plan: 2 and Propofol infusion and Treatment may vary due to age or medical condition  Airway Management Planned: Natural Airway and Simple Face Mask  Additional Equipment: None  Intra-op Plan:   Post-operative Plan:   Informed Consent: I have reviewed the patients History and Physical, chart, labs and discussed the procedure including the risks, benefits and alternatives for the proposed anesthesia with the patient or authorized representative who has indicated his/her understanding and acceptance.     Dental advisory given and  Interpreter used for interveiw  Plan Discussed with: CRNA and Anesthesiologist  Anesthesia Plan Comments: (No nausea/vomiting in last 24 hrs. Corpak in place.  OK for sedation...discussed with Dr. Therisa Doyne. Norton Blizzard, MD  )       Anesthesia Quick Evaluation

## 2021-09-12 ENCOUNTER — Inpatient Hospital Stay (HOSPITAL_COMMUNITY): Payer: Medicare Other

## 2021-09-12 ENCOUNTER — Encounter (HOSPITAL_COMMUNITY): Payer: Self-pay | Admitting: Internal Medicine

## 2021-09-12 DIAGNOSIS — E43 Unspecified severe protein-calorie malnutrition: Secondary | ICD-10-CM | POA: Insufficient documentation

## 2021-09-12 DIAGNOSIS — H9190 Unspecified hearing loss, unspecified ear: Secondary | ICD-10-CM | POA: Diagnosis not present

## 2021-09-12 DIAGNOSIS — I10 Essential (primary) hypertension: Secondary | ICD-10-CM | POA: Diagnosis not present

## 2021-09-12 DIAGNOSIS — K8591 Acute pancreatitis with uninfected necrosis, unspecified: Secondary | ICD-10-CM | POA: Diagnosis not present

## 2021-09-12 DIAGNOSIS — E871 Hypo-osmolality and hyponatremia: Secondary | ICD-10-CM | POA: Diagnosis not present

## 2021-09-12 LAB — CYTOLOGY - NON PAP

## 2021-09-12 LAB — GLUCOSE, CAPILLARY
Glucose-Capillary: 106 mg/dL — ABNORMAL HIGH (ref 70–99)
Glucose-Capillary: 106 mg/dL — ABNORMAL HIGH (ref 70–99)
Glucose-Capillary: 109 mg/dL — ABNORMAL HIGH (ref 70–99)
Glucose-Capillary: 118 mg/dL — ABNORMAL HIGH (ref 70–99)

## 2021-09-12 MED ORDER — BOOST / RESOURCE BREEZE PO LIQD CUSTOM
1.0000 | Freq: Three times a day (TID) | ORAL | Status: DC
  Administered 2021-09-12 – 2021-09-21 (×24): 1 via ORAL

## 2021-09-12 MED ORDER — IOHEXOL 350 MG/ML SOLN
80.0000 mL | Freq: Once | INTRAVENOUS | Status: AC | PRN
  Administered 2021-09-12: 80 mL via INTRAVENOUS

## 2021-09-12 MED ORDER — IOHEXOL 9 MG/ML PO SOLN
500.0000 mL | ORAL | Status: AC
Start: 2021-09-12 — End: 2021-09-12

## 2021-09-12 MED ORDER — TRAVASOL 10 % IV SOLN
INTRAVENOUS | Status: AC
  Filled 2021-09-12: qty 921.6

## 2021-09-12 NOTE — Progress Notes (Signed)
PHARMACY - TOTAL PARENTERAL NUTRITION CONSULT NOTE   Indication:  NPO for necrotizing pancreatitis  Patient Measurements: Height: 5' 10" (177.8 cm) Weight: 67.4 kg (148 lb 9.4 oz) IBW/kg (Calculated) : 73 TPN AdjBW (KG): 71.9 Body mass index is 21.32 kg/m. Usual Weight: 80 kg  Assessment: Hunter Ward re-admitted for necrotizing pancreatitis and is NPO.  Initially planning for post-pyloric tube feeds, but unable to advance NGT d/t significant duodenal stricture from pancreatic abscess and surrounding edema. Pharmacy consulted to manage TPN.  Glucose / Insulin: No hx DM. CBG goal <180 - CBGs/SSI stopped 9/29 as they have been well-controlled Electrolytes: 10/3 Lytes are WNL, including CorrCa  Renal: SCr, BUN, bicarb stable & WNL Hepatic: LFTs, T.bili, Alk Phos and TG all WNL (10/3) I/O: Strict I/O ordered - UOP decreased 650 mL (0.4 ml/kg/hr), LBM 10/2 GI Imaging: - 9/15 CTa/p: progressive necrotizing pancreatitis w/ 8 cm suspected abscess at pancreatic head, multiple other loculated collections along pancreatic body; common hepatic duct dilation w/ persistent gallstones in GB. - 9/17 UGI: minimal passage of contrast past duodenal stricture, likely d/t mass effect from pancreatic abscess/edema -9/25: CT a/p finding d/w necrotizing pancreatitis, relatively stable pancreatic/peripancreatic necrotic collection within the tail of the pancreas extending into the lesser sac without development of walled-off necrosis at this time. - 9/30 AbXR: Weighted tip feeding tube now either in the gastric pylorus or first portion of the duodenum GI Surgeries / Procedures:  - 8/29: Pancreatic stent placed - 9/16: NG tube placed - 9/23: NGT placed at proximal descending duodenum by IR - 9/26: NGT came out - 9/27: Dobhoff tube placement by IR - 10/4 EGD: removed pancreatic stent, removed feeding tube  Central access: Triple lumen PICC 9/19 TPN start date: 9/19  Nutritional Goals:  per RD Assessment  (08/28/21):  Kcal: 1800 - 2000 Protein: 85 - 95 g Fluid: 2 L/day  Current TPN formulation at goal rate of 80 mL/hr provides 92 g protein, 1886 kcals, and 288 g dextrose.   Current Nutrition:  Clear liquids, TPN  Significant Event: 9/23: NGT was advanced by IR, but is now back in stomach - likely due to ongoing pancreatic inflammation per GI.  9/26: TPN was disconnected for contrast administration CT last PM (only 1 site for IV access) and unable to restart due to infection control policy.  Currently has D10 running at 80 ml/hr infusing.  Therefore, AM labs do not reflect electrolyte from TPN formula yesterday.  NGT came out. 9/27: TPN resumed last PM.  9/28: Plan per GI is to stay on TPN until able to tolerate at least soft diet.  Plan:  At 1800:  Continue TPN at goal rate 80 mL/hr   Electrolytes in TPN:  Na - 125 mEq/L  K - 25 mEq/L Ca - 3 mEq/L Mg - 5 mEq/L Phos - 15 mmol/L Cl:Ac ratio 1:2 Add standard MVI and trace elements to TPN Continue no CBG check since they have been well-controlled, if AM glucose rises, will re-institute  No current MIVF; orders per MD if needed Monitor TPN labs on Mon/Thurs Follow up ability to tolerate diet    Gretta Arab PharmD, BCPS Clinical Pharmacist [facility] main pharmacy [phone] 09/12/2021 8:40 AM

## 2021-09-12 NOTE — Progress Notes (Addendum)
Nutrition Follow-up  DOCUMENTATION CODES:   Severe malnutrition in context of acute illness/injury  INTERVENTION:   Parenteral Nutrition - titrate to meet estimated nutrition needs - spoke with Pharmacy  Encourage Clear liquid diet as able  Boost Breeze po TID, each supplement provides 250 kcal and 9 grams of protein   NUTRITION DIAGNOSIS:   Severe Malnutrition related to acute illness (pancreatitis) as evidenced by severe fat depletion, severe muscle depletion, energy intake < or equal to 50% for > or equal to 5 days, percent weight loss 17% x 4 weeks.  Ongoing.   GOAL:   Patient will meet greater than or equal to 90% of their needs Met with TPN  MONITOR:   PO intake, Supplement acceptance, Diet advancement  REASON FOR ASSESSMENT:   Consult New TPN/TNA  ASSESSMENT:   70 year old male with PMH pancreatitis, HTN, hx CCY (~30 yrs ago), deafness (uses ASL) who presented to the hospital with worsening abdomial pain and inability to keep any food down the past 3 days. His last meal kept down was on 08/22/21.  Spoke with patient's sister and patient's niece who were at his bedside and spoke with patient using interpreter [phone].  Pt/family report that he has lived on his own in his own house for 18 years but after becoming ill 4 weeks ago moved in with his sister and now they help each other. Pt able to list foods he enjoys through the interpreter. Per family pt ate out and really had no food at his house when he lived there. He would buy the pre-made stew and eat that at home when needed. In the last 4 weeks he was eating eggs at breakfast, sandwich at lunch, and dinner was mashed potatoes and gravy and a vegetable. He was no longer really eating meat during this time. He at some point was only eating/tolerating some pudding/yogurt.   He first reports no weight loss and eats well. He then reports that his usual weight was 175 lb but in the last 4 weeks has lost a lot of weight  needing to use a walker at his sister's home due to weakness. He is now 145 lb per pt. His weight 10/2 was 148 lb. Admission weight was 158 lb however weights have been documented as 147-170 lb during admission. Family confirms weight loss PTA and agree with time frame. This would be 17% weight loss x 4 weeks.   He reports no nausea or abd pain. He does state that his abdomen is sore.  CT of abd pending, pt unable to eat lunch which is at his bedside. He does not like orange, cranberry, or lemon flavors.  He is willing to try Boost Breeze and once diet advanced would try vanilla or chocolate ensure. He has never drank supplements before.   PTA - 8/29 pancreatic stent placed  9/16 admit for progressive necrotizing pancreatitis with 8 cm abscess of pancreatic head and multiple other collections, common hepatic duct dilations with persistent gallstones in GB, NG tube placed 9/17 UGI, duodenal stricture due to mass effect from pancreatic abscess/edema 9/19 TPN started 9/23 NG tube placed at proximal descending duodenum by IR - unable to advance further  9/26 NG tube out 9/27 small bore feeding place in IR - unable to advance to LOT 9/30 weighted feeding tip now in gastric pylorus or first portion of the duodenum; unable to advance further 10/4 EGD; removed pancreatic stent, removed feeding tube since it was not able to be advanced further  TPN @ 80 ml/h - provides: 1886 kcal and 92 grams protein    Medications reviewed and include: protonix  Labs reviewed CBG's: 106-127  UOP: 950 ml   NUTRITION - FOCUSED PHYSICAL EXAM:  Flowsheet Row Most Recent Value  Orbital Region Severe depletion  Upper Arm Region Severe depletion  Thoracic and Lumbar Region Severe depletion  Buccal Region Severe depletion  Temple Region Severe depletion  Clavicle Bone Region Severe depletion  Clavicle and Acromion Bone Region Severe depletion  Scapular Bone Region Severe depletion  Dorsal Hand Severe depletion   Patellar Region Severe depletion  Anterior Thigh Region Severe depletion  Posterior Calf Region Severe depletion  Edema (RD Assessment) None  Hair Reviewed  Eyes Reviewed  Mouth Reviewed  Skin Reviewed  Nails Reviewed       Diet Order:   Diet Order             Diet clear liquid Room service appropriate? Yes; Fluid consistency: Thin  Diet effective now                   EDUCATION NEEDS:   No education needs have been identified at this time  Skin:  Skin Assessment: Reviewed RN Assessment  Last BM:  10/2  Height:   Ht Readings from Last 1 Encounters:  08/25/21 _0  (1.778 m)    Weight:   Wt Readings from Last 1 Encounters:  09/10/21 67.4 kg    BMI:  Body mass index is 21.32 kg/m.  Estimated Nutritional Needs:   Kcal:  2100-2300  Protein:  110-125 grams  Fluid:  > 2 L/day  Lockie Pares., RD, LDN, CNSC See AMiON for contact information

## 2021-09-12 NOTE — Progress Notes (Signed)
PROGRESS NOTE    Hunter Ward  MGN:[id number] DOB: Dec 01, 1951 DOA: 08/24/2021 PCP: Gaynelle Arabian, MD   Brief Narrative: Hunter Ward is a 70 y.o. male with history of hypertension, deafness, hypertension.  Patient presented secondary to worsening abdominal pain, nausea, vomiting.  On admission patient found to have evidence of progressive necrotizing pancreatitis.  GI consulted for management.  Attempt for enteral nutrition failed.  TPN started.  Anticipate prolonged hospitalization.   Assessment & Plan:   Principal Problem:   Necrotizing pancreatitis Active Problems:   HTN (hypertension)   Normocytic anemia   Hyponatremia   Mild protein malnutrition (HCC)   Deafness   Protein-calorie malnutrition, severe   Necrotizing pancreatitis Progressive.  Complicated by suspected abscess as well.  Patient started empirically on Zosyn IV.  Gastroenterology consulted.  IR consulted for consideration of drain placement however this was unable to be performed.  Multiple attempts to advance a nasojejunal feeding tube to allow for enteric feeds have failed in achieving placement in jejunum.  PICC line was placed and patient was started on TPN. Abdominal x-ray from 9/30 significant for feeding tube tip in proximal duodenum. EGD (10/4) significant for duodenitis; feeding tube removed. 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  Recommendation for no anticoagulation in setting of necrotizing pancreatitis and possible transformation to hemorrhagic pancreatitis.  Nausea/vomiting -Continue Compazine, Zofran prn  Primary hypertension Patient is on lisinopril, nifedipine as an outpatient.  Antihypertensives held on admission.  Patient with soft blood pressures while admitted.  Blood pressure is adequately controlled.  Deafness Communication via ASL  interpreter  Hyponatremia Resolved  Hypophosphatemia Repleted  Normocytic anemia Prior baseline of about 14.  Drift down to about 10 and currently stable.  No evidence of bleeding.  Constipation Resolved with bowel regimen. -Continue Dulcolax suppository as needed   DVT prophylaxis: Lovenox Code Status:   Code Status: Full Code Family Communication: None at bedside. Called sister; no response. Disposition Plan: Discharge pending ability to advance oral diet in addition to GI recommendations   Consultants:  [facility]  Procedures:  NG TUBE PLACEMENT PICC LINE PLACEMENT  Antimicrobials: Zosyn IV    Subjective:  Interpreter used.  Some mild upper abdominal tenderness after his procedure yesterday. No nausea or vomiting. No other concerns. Drinking some fluids.  Objective: Vitals:   09/11/21 1507 09/11/21 2100 09/12/21 0447 09/12/21 1408  BP: 117/69 116/78 99/61 116/70  Pulse: (!) 59 61 (!) 55 (!) 59  Resp: 16 18 18 16   Temp: 98.6 F (37 C) 99.3 F (37.4 C) 97.8 F (36.6 C) 98.2 F (36.8 C)  TempSrc: Oral Oral  Oral  SpO2: 100% 98% 98% 99%  Weight:      Height:        Intake/Output Summary (Last 24 hours) at 09/12/2021 1603 Last data filed at 09/12/2021 1542 Gross per 24 hour  Intake 3105.15 ml  Output 950 ml  Net 2155.15 ml    Filed Weights   08/28/21 0626 09/04/21 1209 09/10/21 1521  Weight: 77.2 kg 67.1 kg 67.4 kg    Examination:  General exam: Appears calm and comfortable Respiratory system: Clear to auscultation. Respiratory effort normal. Cardiovascular system: S1 & S2 heard, RRR. No murmurs, rubs, gallops or clicks. Gastrointestinal system: Abdomen is nondistended, soft and mildly tender in upper quadrants. No organomegaly or masses felt. Normal bowel sounds heard. Central nervous system: Alert and oriented.  No focal neurological deficits. Musculoskeletal: No edema. No calf tenderness Skin: No cyanosis. No rashes Psychiatry:  Judgement and insight appear normal. Mood & affect appropriate.    Data Reviewed: I have personally reviewed following labs and imaging studies  CBC Lab Results  Component Value Date   WBC 6.9 09/05/2021   RBC 3.22 (L) 09/05/2021   HGB 10.2 (L) 09/05/2021   HCT 31.5 (L) 09/05/2021   MCV 97.8 09/05/2021   MCH 31.7 09/05/2021   PLT 209 09/05/2021   MCHC 32.4 09/05/2021   RDW 13.4 09/05/2021   LYMPHSABS 1.0 08/29/2021   MONOABS 0.6 08/29/2021   EOSABS 0.0 08/29/2021   BASOSABS 0.0 50/56/9794     Last metabolic panel Lab Results  Component Value Date   NA 138 09/11/2021   K 3.8 09/11/2021   CL 105 09/11/2021   CO2 30 09/11/2021   BUN 20 09/11/2021   CREATININE 0.59 (L) 09/11/2021   GLUCOSE 103 (H) 09/11/2021   GFRNONAA >60 09/11/2021   CALCIUM 8.6 (L) 09/11/2021   PHOS 3.3 09/11/2021   PROT 6.5 09/11/2021   ALBUMIN 2.5 (L) 09/11/2021   BILITOT 0.5 09/11/2021   ALKPHOS 48 09/11/2021   AST 16 09/11/2021   ALT 15 09/11/2021   ANIONGAP 3 (L) 09/11/2021    CBG (last 3)  Recent Labs    09/12/21 0001 09/12/21 0446 09/12/21 1238  GLUCAP 118* 109* 106*      GFR: Estimated Creatinine Clearance: 80.7 mL/min (A) (by C-G formula based on SCr of 0.59 mg/dL (L)).  Coagulation Profile: No results for input(s): INR, PROTIME in the last 168 hours.  No results found for this or any previous visit (from the past 240 hour(s)).      Radiology Studies: No results found.      Scheduled Meds:  Chlorhexidine Gluconate Cloth  6 each Topical Daily   enoxaparin (LOVENOX) injection  40 mg Subcutaneous Daily   feeding supplement  1 Container Oral TID BM   iohexol  500 mL Oral Q1H   pantoprazole (PROTONIX) IV  40 mg Intravenous Q24H   Continuous Infusions:  TPN ADULT (ION) 80 mL/hr at 09/11/21 1751   TPN ADULT (ION)       LOS: 18 days     Cordelia Poche, MD [facility] 09/12/2021, 4:03 PM  If 7PM-7AM, please contact night-coverage [email or web address]

## 2021-09-12 NOTE — Progress Notes (Signed)
Physical Therapy Treatment Patient Details Name: Hunter Ward MRN: [id number] DOB: [birth year] Today's Date: 09/12/2021   History of Present Illness Hunter Ward is a 70 yo male admitted with necrotizing pancreatitis. 09/11/21 Removed plastic pancreatic stent in the duodenum and Removal of feeding tube.  PMHx of deafness, HTN    PT Comments    Pt ambulated in hallway and utilized RW.  Pt appears to be modified independent with movement in room.  Pt has met acute PT goals so will d/c from PT at this time.  Recommend staff or family continue to assist pt with hallway ambulation in preparation for d/c home.  If pt agreeable, may benefit from HHPT and RW upon d/c.    Recommendations for follow up therapy are one component of a multi-disciplinary discharge planning process, led by the attending physician.  Recommendations may be updated based on patient status, additional functional criteria and insurance authorization.  Follow Up Recommendations  Home health PT     Equipment Recommendations  Rolling walker with 5" wheels    Recommendations for Other Services       Precautions / Restrictions Precautions Precautions: None     Mobility  Bed Mobility Overal bed mobility: Modified Independent                  Transfers Overall transfer level: Modified independent                  Ambulation/Gait Ambulation/Gait assistance: Supervision Gait Distance (Feet): 350 Feet Assistive device: Rolling walker (2 wheeled) Gait Pattern/deviations: Step-through pattern;Decreased stride length     General Gait Details: pt utilized RW and no unsteadiness or LOB observed, no symptoms during ambulation reported   Stairs             Wheelchair Mobility    Modified Rankin (Stroke Patients Only)       Balance Overall balance assessment: No apparent balance deficits (not formally assessed)                                          Cognition  Arousal/Alertness: Awake/alert Behavior During Therapy: WFL for tasks assessed/performed Overall Cognitive Status: Within Functional Limits for tasks assessed                                        Exercises      General Comments        Pertinent Vitals/Pain Pain Assessment: Faces Faces Pain Scale: Hurts little more Pain Location: abdomen Pain Descriptors / Indicators: Sore Pain Intervention(s): Repositioned;Monitored during session    Home Living                      Prior Function            PT Goals (current goals can now be found in the care plan section) Progress towards PT goals: Progressing toward goals    Frequency    Min 3X/week      PT Plan Current plan remains appropriate    Co-evaluation              AM-PAC PT "6 Clicks" Mobility   Outcome Measure  Help needed turning from your back to your side while in a flat bed without using bedrails?: None Help needed moving  from lying on your back to sitting on the side of a flat bed without using bedrails?: None Help needed moving to and from a bed to a chair (including a wheelchair)?: A Little Help needed standing up from a chair using your arms (e.g., wheelchair or bedside chair)?: A Little Help needed to walk in hospital room?: A Little Help needed climbing 3-5 steps with a railing? : A Little 6 Click Score: 20    End of Session Equipment Utilized During Treatment: Gait belt Activity Tolerance: Patient tolerated treatment well Patient left: in bed;with call bell/phone within reach Nurse Communication: Mobility status PT Visit Diagnosis: Muscle weakness (generalized) (M62.81)     Time: 3329-5188 PT Time Calculation (min) (ACUTE ONLY): 16 min  Charges:  $Gait Training: 8-22 mins                     Arlyce Dice, DPT Acute Rehabilitation Services Pager: [phone] Office: [place] 09/12/2021, 3:40 PM

## 2021-09-12 NOTE — Progress Notes (Signed)
[facility] Gastroenterology Progress Note  Hunter Ward 70 y.o. [birth year]  CC:  Necrotizing pancreatitis   Subjective: Patient reports intermittent abdominal soreness, denies pain. Denies nausea/vomiting. Appears to be tolerating clear liquids well.  [employer] video interpreting services was utilized for this encounter (ASL, [place], # [phone])   ROS : Review of Systems  Cardiovascular:  Negative for chest pain and palpitations.  Gastrointestinal:  Positive for abdominal pain ("soreness"). Negative for blood in stool, constipation, diarrhea, heartburn, melena, nausea and vomiting.     Objective: Vital signs in last 24 hours: Vitals:   09/11/21 2100 09/12/21 0447  BP: 116/78 99/61  Pulse: 61 (!) 55  Resp: 18 18  Temp: 99.3 F (37.4 C) 97.8 F (36.6 C)  SpO2: 98% 98%    Physical Exam:  General:  Alert, cooperative, no distress, appears stated age  Head:  Normocephalic, without obvious abnormality, atraumatic  Eyes:  Anicteric sclera, EOM's intact  Lungs:   Clear to auscultation bilaterally, respirations unlabored  Heart:  Regular rate and rhythm, S1, S2 normal  Abdomen:   Soft, non-tender, bowel sounds active all four quadrants,  no masses,     Lab Results: Recent Labs    09/11/21 0324  NA 138  K 3.8  CL 105  CO2 30  GLUCOSE 103*  BUN 20  CREATININE 0.59*  CALCIUM 8.6*  MG 1.9  PHOS 3.3   Recent Labs    09/11/21 0324  AST 16  ALT 15  ALKPHOS 48  BILITOT 0.5  PROT 6.5  ALBUMIN 2.5*   No results for input(s): WBC, NEUTROABS, HGB, HCT, MCV, PLT in the last 72 hours. No results for input(s): LABPROT, INR in the last 72 hours.    Assessment Necrotizing pancreatitis -Repeat CT 9/25: Relatively stable pancreatic/peripancreatic necrotic collection within the tail of the pancreas extending into the lesser sac without development of  walled-off necrosis at this time.  Stable moderate extrahepatic biliary ductal dilation with marked tapering of the extrahepatic bile  duct within the pancreatic head  - Triglycerides 37 - EGD 10/4: Normal esophagus. Erythematous mucosa in the cardia, gastric fundus, gastric body, greater curvature, lesser curvature, antrum and pylorus. Duodenitis. Removed plastic pancreatic stent in the duodenum. Removal of feeding tube,despite multiple attempts, unable to leave the tip in the jejunum/post pyloric area.   Plan: Will obtain repeat CT abdomen pelvis today.  Can advance diet to clear liquids as tolerated  Continue supportive care  [facility] will follow   Garnette Scheuermann PA-C 09/12/2021, 8:08 AM  Contact #  [phone]

## 2021-09-13 DIAGNOSIS — K8591 Acute pancreatitis with uninfected necrosis, unspecified: Secondary | ICD-10-CM | POA: Diagnosis not present

## 2021-09-13 LAB — CBC
HCT: 31.7 % — ABNORMAL LOW (ref 39.0–52.0)
Hemoglobin: 10.1 g/dL — ABNORMAL LOW (ref 13.0–17.0)
MCH: 31.6 pg (ref 26.0–34.0)
MCHC: 31.9 g/dL (ref 30.0–36.0)
MCV: 99.1 fL (ref 80.0–100.0)
Platelets: 130 10*3/uL — ABNORMAL LOW (ref 150–400)
RBC: 3.2 MIL/uL — ABNORMAL LOW (ref 4.22–5.81)
RDW: 14 % (ref 11.5–15.5)
WBC: 4.6 10*3/uL (ref 4.0–10.5)
nRBC: 0 % (ref 0.0–0.2)

## 2021-09-13 LAB — BASIC METABOLIC PANEL
Anion gap: 6 (ref 5–15)
BUN: 17 mg/dL (ref 8–23)
CO2: 28 mmol/L (ref 22–32)
Calcium: 8.7 mg/dL — ABNORMAL LOW (ref 8.9–10.3)
Chloride: 105 mmol/L (ref 98–111)
Creatinine, Ser: 0.51 mg/dL — ABNORMAL LOW (ref 0.61–1.24)
GFR, Estimated: >60 mL/min (ref >60)
Glucose, Bld: 111 mg/dL — ABNORMAL HIGH (ref 70–99)
Potassium: 3.5 mmol/L (ref 3.5–5.1)
Sodium: 139 mmol/L (ref 135–145)

## 2021-09-13 MED ORDER — TRAVASOL 10 % IV SOLN
INTRAVENOUS | Status: AC
  Filled 2021-09-13: qty 1144.8

## 2021-09-13 NOTE — Progress Notes (Signed)
   09/13/21 1135  Mobility  Activity Contraindicated/medical hold   Spoke with RN about pt status. She stated pt had 2 episodes of emesis this morning, and has been mobilizing pt in hallway as much as possible. Hold mobility for today.    [place] Specialist Acute Rehab Services Office: [phone]

## 2021-09-13 NOTE — Progress Notes (Signed)
[facility] Gastroenterology Progress Note  Hunter Ward 70 y.o. [birth year]  CC:  Necrotizing pancreatitis   Subjective: Patient reports tolerating clear liquids okay. States he cannot drink too much at once, or he will feel very nauseous. No vomiting. Reports "abdominal soreness" 5/10.   [employer] video interpreting services was utilized for this encounter (ASL, [place], # [id number])   ROS : Review of Systems  Cardiovascular:  Negative for chest pain and palpitations.  Gastrointestinal:  Positive for abdominal pain ("soreness" 5/10) and nausea. Negative for blood in stool, constipation, diarrhea, heartburn, melena and vomiting.     Objective: Vital signs in last 24 hours: Vitals:   09/12/21 2124 09/13/21 0400  BP: 120/70 116/70  Pulse: (!) 57 62  Resp: 18 16  Temp: 98.4 F (36.9 C) 98.1 F (36.7 C)  SpO2: 100% 94%    Physical Exam:  General:  Alert, cooperative  Head:  Normocephalic, without obvious abnormality, atraumatic  Eyes:  Anicteric sclera, EOM's intact  Lungs:   Clear to auscultation bilaterally, respirations unlabored  Heart:  Regular rate and rhythm, S1, S2 normal  Abdomen:   Soft, non-tender, bowel sounds active all four quadrants,  no masses,     Lab Results: Recent Labs    09/11/21 0324 09/13/21 0401  NA 138 139  K 3.8 3.5  CL 105 105  CO2 30 28  GLUCOSE 103* 111*  BUN 20 17  CREATININE 0.59* 0.51*  CALCIUM 8.6* 8.7*  MG 1.9  --   PHOS 3.3  --    Recent Labs    09/11/21 0324  AST 16  ALT 15  ALKPHOS 48  BILITOT 0.5  PROT 6.5  ALBUMIN 2.5*   Recent Labs    09/13/21 0401  WBC 4.6  HGB 10.1*  HCT 31.7*  MCV 99.1  PLT 130*   No results for input(s): LABPROT, INR in the last 72 hours.    Assessment Necrotizing pancreatitis -CT abdomen: 10/4:There has been interval improvement in appearance of acute pancreatitis. main duct dilatation has improved. Continued edema, peripancreatic soft tissue stranding is noted and there is persistent diffuse  enhancement suggesting underlying necrosis. Chronic occlusion of the splenic vein. Large volume of free fluid in the pelvis. Unchanged appearance of bilateral pleural effusions, left greater than right. Gallstones. Persistent fusiform dilatation of the common bile duct with mild intrahepatic bile duct dilatation. Beak like narrowing of the mid and distal CBD noted at the level of the head of pancreas secondary to pancreatic edema. No signs of choledocholithiasis. - EGD 10/4: Normal esophagus. Erythematous mucosa in the cardia, gastric fundus, gastric body, greater curvature, lesser curvature, antrum and pylorus. Duodenitis. Removed plastic pancreatic stent in the duodenum. Removal of feeding tube,despite multiple attempts, unable to leave the tip in the jejunum/post pyloric area. Duodenal swelling in the second portion of duodenum appeared improved on endoscopy     Plan: CT evidence of improvement in pancreatitis. Continue supportive care.  Continue clear liquids as tolerated.  [facility] will follow  Garnette Scheuermann PA-C 09/13/2021, 8:22 AM  Contact #  [phone]

## 2021-09-13 NOTE — Progress Notes (Signed)
PROGRESS NOTE    Hunter Ward  DTO:[id number] DOB: 08-Feb-1951 DOA: 08/24/2021 PCP: Gaynelle Arabian, MD   Brief Narrative: Hunter Ward is a 70 y.o. male with history of hypertension, deafness, hypertension.  Patient presented secondary to worsening abdominal pain, nausea, vomiting.  On admission patient found to have evidence of progressive necrotizing pancreatitis.  GI consulted for management.  Attempt for enteral nutrition failed.  TPN started.  Anticipate prolonged hospitalization.   Assessment & Plan:   Principal Problem:   Necrotizing pancreatitis Active Problems:   HTN (hypertension)   Normocytic anemia   Hyponatremia   Mild protein malnutrition (HCC)   Deafness   Protein-calorie malnutrition, severe   Necrotizing pancreatitis Progressive.  Complicated by suspected abscess as well.  Patient started empirically on Zosyn IV.  Gastroenterology consulted.  IR consulted for consideration of drain placement however this was unable to be performed.  Multiple attempts to advance a nasojejunal feeding tube to allow for enteric feeds have failed in achieving placement in jejunum.  PICC line was placed and patient was started on TPN. Abdominal x-ray from 9/30 significant for feeding tube tip in proximal duodenum. EGD (10/4) significant for duodenitis; feeding tube removed. 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  Recommendation for no anticoagulation in setting of necrotizing pancreatitis and possible transformation to hemorrhagic pancreatitis.  Nausea/vomiting -Continue Compazine, Zofran prn  Primary hypertension Patient is on lisinopril, nifedipine as an outpatient.  Antihypertensives held on admission.  Patient with soft blood pressures while  admitted.  Blood pressure is adequately controlled.  Deafness Communication via ASL interpreter  Hyponatremia Resolved  Hypophosphatemia Repleted  Normocytic anemia Prior baseline of about 14.  Drift down to about 10 and currently stable.  No evidence of bleeding.  Constipation Resolved with bowel regimen. -Continue Dulcolax suppository as needed   DVT prophylaxis: Lovenox Code Status:   Code Status: Full Code Family Communication: None at bedside. Disposition Plan: Discharge pending ability to advance oral diet in addition to GI recommendations   Consultants:  [facility]  Procedures:  NG TUBE PLACEMENT PICC LINE PLACEMENT  Antimicrobials: Zosyn IV    Subjective:  No issues noted overnight  Objective: Vitals:   09/12/21 0447 09/12/21 1408 09/12/21 2124 09/13/21 0400  BP: 99/61 116/70 120/70 116/70  Pulse: (!) 55 (!) 59 (!) 57 62  Resp: 18 16 18 16   Temp: 97.8 F (36.6 C) 98.2 F (36.8 C) 98.4 F (36.9 C) 98.1 F (36.7 C)  TempSrc:  Oral Oral Oral  SpO2: 98% 99% 100% 94%  Weight:      Height:        Intake/Output Summary (Last 24 hours) at 09/13/2021 1343 Last data filed at 09/13/2021 1100 Gross per 24 hour  Intake 2064.69 ml  Output 850 ml  Net 1214.69 ml    Filed Weights   08/28/21 0626 09/04/21 1209 09/10/21 1521  Weight: 77.2 kg 67.1 kg 67.4 kg    Examination:  General: Well appearing, no distress   Data Reviewed: I have personally reviewed following labs and imaging studies  CBC Lab Results  Component Value Date   WBC 4.6 09/13/2021   RBC 3.20 (L) 09/13/2021   HGB 10.1 (L) 09/13/2021   HCT 31.7 (L) 09/13/2021   MCV 99.1 09/13/2021   MCH 31.6 09/13/2021   PLT 130 (L)  09/13/2021   MCHC 31.9 09/13/2021   RDW 14.0 09/13/2021   LYMPHSABS 1.0 08/29/2021   MONOABS 0.6 08/29/2021   EOSABS 0.0 08/29/2021   BASOSABS 0.0 73/22/0254     Last metabolic panel Lab Results  Component Value Date   NA 139 09/13/2021   K  3.5 09/13/2021   CL 105 09/13/2021   CO2 28 09/13/2021   BUN 17 09/13/2021   CREATININE 0.51 (L) 09/13/2021   GLUCOSE 111 (H) 09/13/2021   GFRNONAA >60 09/13/2021   CALCIUM 8.7 (L) 09/13/2021   PHOS 3.3 09/11/2021   PROT 6.5 09/11/2021   ALBUMIN 2.5 (L) 09/11/2021   BILITOT 0.5 09/11/2021   ALKPHOS 48 09/11/2021   AST 16 09/11/2021   ALT 15 09/11/2021   ANIONGAP 6 09/13/2021    CBG (last 3)  Recent Labs    09/12/21 0446 09/12/21 1238 09/12/21 1814  GLUCAP 109* 106* 106*      GFR: Estimated Creatinine Clearance: 80.7 mL/min (A) (by C-G formula based on SCr of 0.51 mg/dL (L)).  Coagulation Profile: No results for input(s): INR, PROTIME in the last 168 hours.  No results found for this or any previous visit (from the past 240 hour(s)).      Radiology Studies: CT ABDOMEN PELVIS W WO CONTRAST  Result Date: 09/12/2021 CLINICAL DATA:  Evaluate necrotizing pancreatitis EXAM: CT ABDOMEN AND PELVIS WITHOUT AND WITH CONTRAST TECHNIQUE: Multidetector CT imaging of the abdomen and pelvis was performed following the standard protocol before and following the bolus administration of intravenous contrast. CONTRAST:  37mL OMNIPAQUE IOHEXOL 350 MG/ML SOLN COMPARISON:  09/03/2021 FINDINGS: Lower chest: Similar appearance of bilateral pleural effusions, left greater than right. Hepatobiliary: There is no focal liver abnormality. Gallbladder is collapsed around multiple stones which measure up to 4 mm. Mild intrahepatic bile duct dilatation is again seen. Fusiform dilatation of the common bile duct measures up to 1.5 cm. Formally this measured the same. No signs of choledocholithiasis. Beak like narrowing of the mid and distal common bile duct is noted at the level of the head of pancreas. Pancreas: Diffuse pancreatic edema with peripancreatic inflammatory fat stranding is identified. There is diffuse heterogeneous enhancement of the pancreas with focal areas of hypoenhancement suggesting  underlying necrosis. This is most notable within the tail of pancreas. Compared with 08/24/2021 the overall size of the pancreas has decreased in the interval suggesting resolving pancreatitis. There has been interval removal of previous pancreatic duct stent. No significant main duct dilatation identified at this time. Previously noted pseudocysts within the pancreas have decreased in volume in the interval. Small pseudocyst within the neck of pancreas measures 1.4 cm on today's study, image 52/4. This is compared with 2.5 cm previously. Small pseudo cyst within tail of pancreas measures 1.1 cm, image 44/4. Previously 2.5 cm. No extra pancreatic fluid collections identified within the upper abdomen. Spleen: Normal in size without focal abnormality. Adrenals/Urinary Tract: Normal right adrenal gland. Unchanged low-attenuation left adrenal nodule measures 2 cm and is favored to represent a small adenoma. No hydronephrosis or kidney mass. Urinary bladder scratch set tiny stones within the dependent portion of the left side of bladder are identified, image 140/9. Bladder otherwise unremarkable. Stomach/Bowel: Stomach appears nondistended. No bowel wall thickening, inflammation, or distension. Vascular/Lymphatic: Aortic atherosclerosis. Chronic occlusion of the splenic vein with upper abdominal collateral formation is noted. Continued mass effect and narrowing of the extra hepatic portal vein is noted. There is occlusion of the portal venous confluence and distal superior mesenteric vein  with distal reconstitution of the SMV, image 28/10. Reproductive: Prostate gland enlargement. Other: There is a large volume of free fluid identified within the pelvis, image 127/9. Musculoskeletal: No acute or significant osseous findings. IMPRESSION: 1. There has been interval improvement in appearance of acute pancreatitis. Previously noted fluid pancreatic pseudocyst have in size compared with the previous studies. The pancreatic  duct stent has been removed in the main duct dilatation has improved. Continued edema, peripancreatic soft tissue stranding is noted and there is persistent diffuse heterogeneous enhancement of the pancreas with focal areas of hypoenhancement suggesting underlying necrosis. 2. Chronic occlusion of the splenic vein is again noted. 3. Large volume of free fluid is noted within the pelvis without definitive signs of loculation. 4. Unchanged appearance of bilateral pleural effusions, left greater than right. 5. Gallstones. 6. Persistent fusiform dilatation of the common bile duct with mild intrahepatic bile duct dilatation. Beak like narrowing of the mid and distal CBD noted at the level of the head of pancreas secondary to pancreatic edema. No signs of choledocholithiasis. 7. Aortic Atherosclerosis (ICD10-I70.0). Electronically Signed   By: Kerby Moors M.D.   On: 09/12/2021 19:57        Scheduled Meds:  Chlorhexidine Gluconate Cloth  6 each Topical Daily   enoxaparin (LOVENOX) injection  40 mg Subcutaneous Daily   feeding supplement  1 Container Oral TID BM   pantoprazole (PROTONIX) IV  40 mg Intravenous Q24H   Continuous Infusions:  TPN ADULT (ION) 80 mL/hr at 09/12/21 1805   TPN ADULT (ION)       LOS: 19 days     Cordelia Poche, MD [facility] 09/13/2021, 1:43 PM  If 7PM-7AM, please contact night-coverage [email or web address]

## 2021-09-13 NOTE — Progress Notes (Signed)
PHARMACY - TOTAL PARENTERAL NUTRITION CONSULT NOTE   Indication:  NPO for necrotizing pancreatitis  Patient Measurements: Height: 5' 10"  (177.8 cm) Weight: 67.4 kg (148 lb 9.4 oz) IBW/kg (Calculated) : 73 TPN AdjBW (KG): 71.9 Body mass index is 21.32 kg/m. Usual Weight: 80 kg  Assessment: 50 yoM re-admitted for necrotizing pancreatitis and is NPO.  Initially planning for post-pyloric tube feeds, but unable to advance NGT d/t significant duodenal stricture from pancreatic abscess and surrounding edema. Pharmacy consulted to manage TPN.  Glucose / Insulin: No hx DM. CBG goal <180. SSI stopped 9/29 as they have been well-controlled Electrolytes: 10/3 Lytes are WNL, K borderline at 3.5. Renal: SCr, BUN, bicarb stable & WNL Hepatic: LFTs, T.bili, Alk Phos and TG all WNL (10/3) I/O: Strict I/O ordered.  - UOP ok, LBM 10/2 GI Imaging: - 9/15 CTa/p: progressive necrotizing pancreatitis w/ 8 cm suspected abscess at pancreatic head, multiple other loculated collections along pancreatic body; common hepatic duct dilation w/ persistent gallstones in GB. - 9/17 UGI: minimal passage of contrast past duodenal stricture, likely d/t mass effect from pancreatic abscess/edema -9/25: CT a/p finding d/w necrotizing pancreatitis, relatively stable pancreatic/peripancreatic necrotic collection within the tail of the pancreas extending into the lesser sac without development of walled-off necrosis at this time. - 9/30 AbXR: Weighted tip feeding tube now either in the gastric pylorus or first portion of the duodenum GI Surgeries / Procedures:  - 8/29: Pancreatic stent placed - 9/16: NG tube placed - 9/23: NGT placed at proximal descending duodenum by IR - 9/26: NGT came out - 9/27: Dobhoff tube placement by IR - 10/4 EGD: removed pancreatic stent, removed feeding tube  Central access: Triple lumen PICC 9/19 TPN start date: 9/19  Nutritional Goals:  per RD Assessment (09/12/21):  Kcal: 2100-2300   Protein: 110-125 g Fluid: > 2 L/day  Current TPN formulation at goal rate of 90 mL/hr provides 114 g protein, 2163 kcals   Current Nutrition:  Clear liquids, RD added boost breeze on 10/4 (minimal intake charted), TPN  Significant Event: 9/23: NGT was advanced by IR, but is now back in stomach - likely due to ongoing pancreatic inflammation per GI.  9/26: TPN was disconnected for contrast administration CT last PM (only 1 site for IV access) and unable to restart due to infection control policy.  Currently has D10 running at 80 ml/hr infusing.  Therefore, AM labs do not reflect electrolyte from TPN formula yesterday.  NGT came out. 9/27: TPN resumed last PM.  9/28: Plan per GI is to stay on TPN until able to tolerate at least soft diet.  Plan:  At 1800:  Increase TPN to new goal rate of 90 mL/hr   Electrolytes in TPN:  Na - 125 mEq/L  K - 30 mEq/L Ca - 3 mEq/L Mg - 5 mEq/L Phos - 15 mmol/L Cl:Ac ratio 1:2 Add standard MVI and trace elements to TPN Continue no CBG check since they have been well-controlled, if AM glucose rises, will re-institute  No current MIVF; orders per MD if needed Monitor TPN labs on Mon/Thurs Follow up ability to tolerate diet  Elenor Quinones, PharmD, BCPS, BCIDP Clinical Pharmacist 09/13/2021 7:36 AM

## 2021-09-14 DIAGNOSIS — K8591 Acute pancreatitis with uninfected necrosis, unspecified: Secondary | ICD-10-CM | POA: Diagnosis not present

## 2021-09-14 LAB — COMPREHENSIVE METABOLIC PANEL
ALT: 12 U/L (ref 0–44)
AST: 14 U/L — ABNORMAL LOW (ref 15–41)
Albumin: 2.3 g/dL — ABNORMAL LOW (ref 3.5–5.0)
Alkaline Phosphatase: 48 U/L (ref 38–126)
Anion gap: 2 — ABNORMAL LOW (ref 5–15)
BUN: 18 mg/dL (ref 8–23)
CO2: 32 mmol/L (ref 22–32)
Calcium: 8.4 mg/dL — ABNORMAL LOW (ref 8.9–10.3)
Chloride: 105 mmol/L (ref 98–111)
Creatinine, Ser: 0.61 mg/dL (ref 0.61–1.24)
GFR, Estimated: >60 mL/min (ref >60)
Glucose, Bld: 128 mg/dL — ABNORMAL HIGH (ref 70–99)
Potassium: 3.5 mmol/L (ref 3.5–5.1)
Sodium: 139 mmol/L (ref 135–145)
Total Bilirubin: 0.5 mg/dL (ref 0.3–1.2)
Total Protein: 5.8 g/dL — ABNORMAL LOW (ref 6.5–8.1)

## 2021-09-14 LAB — PHOSPHORUS: Phosphorus: 3.1 mg/dL (ref 2.5–4.6)

## 2021-09-14 LAB — MAGNESIUM: Magnesium: 1.8 mg/dL (ref 1.7–2.4)

## 2021-09-14 MED ORDER — POLYETHYLENE GLYCOL 3350 17 G PO PACK
17.0000 g | PACK | Freq: Every day | ORAL | Status: DC
  Administered 2021-09-14 – 2021-09-22 (×7): 17 g via ORAL
  Filled 2021-09-14 (×8): qty 1

## 2021-09-14 MED ORDER — TRAVASOL 10 % IV SOLN
INTRAVENOUS | Status: AC
  Filled 2021-09-14: qty 1144.8

## 2021-09-14 NOTE — Progress Notes (Signed)
PROGRESS NOTE  Hunter Ward  DOB: July 03, 1951  PCP: Gaynelle Arabian, MD AST:[id number]  DOA: 08/24/2021  LOS: 20 days  Hospital Day: 28   Chief Complaint  Patient presents with   Fever   Emesis   Abdominal Pain    Brief narrative: Hunter Ward is a 70 y.o. male with PMH significant for HTN, deaf mutism. Patient presented to the ED on 9/15 with complaint of worsening abdominal pain, nausea, vomiting. He was found to have evidence of progressive necrotizing pancreatitis. Admitted to hospitalist service GI consultation was obtained. Attempt for enteral nutrition failed.  TPN was started See below for details  Subjective: Patient was seen and examined this morning.  Pleasant elderly Caucasian male.  Sitting up in chair.  Not in distress.  He had an episode of vomiting earlier.  Family at bedside. Ongoing TPN.  Assessment/Plan: Necrotizing pancreatitis -Patient remains hospitalized with progressive necrotizing pancreatitis which is complicated by suspected abscess.  Currently on IV Rocephin.  GI and IR following.  IR tried drain placement however it was not successful. -On 10/4, CT abdomen was repeated which showed an interval improvement in the appearance of acute pancreatitis.  An EGD was tried to advance NJ tube to leave the tip in the jejunum/postpyloric area but it failed.   -Currently patient is getting TPN through PICC line. -Currently also on clear liquid diet, IV antiemetic as needed  Third spacing of fluid -Related to pancreatitis.  Per CT scan from 10/4, patient has a large volume of free fluid in the pelvis, bilateral pleural effusion left greater than right. -Hemodynamically stable at this time.  Cholelithiasis, CBD dilatation  -CT abdomen from 10/4 showed gallstones and also persistent fusiform dilatation of the common bile duct with mild intrahepatic bile duct dilatation. Beak like narrowing of the mid and distal CBD noted at the level of the head of pancreas  secondary to pancreatic edema. No signs of choledocholithiasis. -Continue to monitor liver enzymes. Recent Labs  Lab 09/11/21 0324 09/14/21 0407  AST 16 14*  ALT 15 12  ALKPHOS 48 48  BILITOT 0.5 0.5  PROT 6.5 5.8*  ALBUMIN 2.5* 2.3*   Hypokalemia/hypomagnesemia -Electrolyte levels being monitored and replaced as needed Recent Labs  Lab 09/09/21 0327 09/11/21 0324 09/13/21 0401 09/14/21 0407  K 3.8 3.8 3.5 3.5  MG  --  1.9  --  1.8  PHOS  --  3.3  --  3.1   Suspected to splenic vein thrombosis -Probably chronic.  No anticoagulation in the setting of necrotizing pancreatitis for the risk of transformation to hemorrhagic pancreatitis.  Essential hypertension -On nifedipine, lisinopril as an outpatient -Blood pressure medicines currently on hold  Deaf mutism -ASL interpreter.  Family at bedside was able to help out for me today.  Chronic anemia -Mild chronic anemia.  Currently stable above 10.  No evidence of bleeding. Recent Labs    08/27/21 0511 08/28/21 0425 08/29/21 0326 09/05/21 0339 09/13/21 0401  HGB 10.3* 10.4* 10.2* 10.2* 10.1*  MCV 98.1 98.5 98.1 97.8 99.1   Constipation -As needed Dulcolax.  Mobility: Encourage ambulation Code Status:   Code Status: Full Code  Nutritional status: Body mass index is 21.32 kg/m. Nutrition Problem: Severe Malnutrition Etiology: acute illness (pancreatitis) Signs/Symptoms: severe fat depletion, severe muscle depletion, energy intake < or equal to 50% for > or equal to 5 days, percent weight loss Percent weight loss: 17 % Diet:  Diet Order  Diet clear liquid Room service appropriate? Yes; Fluid consistency: Thin  Diet effective now                  DVT prophylaxis:  enoxaparin (LOVENOX) injection 40 mg Start: 09/12/21 1000   Antimicrobials: Completed a course of IV Zosyn Fluid: TPN Consultants: GI Family Communication: Family at bedside  Status is: Inpatient  Remains inpatient appropriate  because: Continues to require TPN for necrotizing pancreatitis Dispo: The patient is from: Home              Anticipated d/c is to: Home in next several days              Patient currently is not medically stable to d/c.   Difficult to place patient No     Infusions:   TPN ADULT (ION) 90 mL/hr at 09/13/21 1722   TPN ADULT (ION)      Scheduled Meds:  Chlorhexidine Gluconate Cloth  6 each Topical Daily   enoxaparin (LOVENOX) injection  40 mg Subcutaneous Daily   feeding supplement  1 Container Oral TID BM   pantoprazole (PROTONIX) IV  40 mg Intravenous Q24H   polyethylene glycol  17 g Oral Daily    Antimicrobials: Anti-infectives (From admission, onward)    Start     Dose/Rate Route Frequency Ordered Stop   08/25/21 0200  piperacillin-tazobactam (ZOSYN) IVPB 3.375 g        3.375 g 12.5 mL/hr over 240 Minutes Intravenous Every 8 hours 08/24/21 2009 09/08/21 0854   08/24/21 2200  piperacillin-tazobactam (ZOSYN) IVPB 3.375 g  Status:  Discontinued        3.375 g 100 mL/hr over 30 Minutes Intravenous Every 8 hours 08/24/21 1931 08/24/21 2009   08/24/21 1915  piperacillin-tazobactam (ZOSYN) IVPB 3.375 g        3.375 g 100 mL/hr over 30 Minutes Intravenous  Once 08/24/21 1911 08/24/21 2030       PRN meds: acetaminophen **OR** acetaminophen, bisacodyl, HYDROmorphone (DILAUDID) injection, lidocaine, ondansetron **OR** ondansetron (ZOFRAN) IV, phenol, prochlorperazine, sodium chloride flush   Objective: Vitals:   09/14/21 0513 09/14/21 1340  BP: 113/67 118/76  Pulse: (!) 57 (!) 53  Resp: 16 16  Temp: 98.1 F (36.7 C) 98.2 F (36.8 C)  SpO2: 99% 100%    Intake/Output Summary (Last 24 hours) at 09/14/2021 1712 Last data filed at 09/14/2021 1400 Gross per 24 hour  Intake 1313.61 ml  Output 800 ml  Net 513.61 ml   Filed Weights   08/28/21 0626 09/04/21 1209 09/10/21 1521  Weight: 77.2 kg 67.1 kg 67.4 kg   Weight change:  Body mass index is 21.32 kg/m.   Physical  Exam: General exam: Pleasant, elderly Caucasian male.  Not in distress Skin: No rashes, lesions or ulcers. HEENT: Atraumatic, normocephalic, no obvious bleeding Lungs: Clear to auscultation bilaterally CVS: Regular rate and rhythm, no murmur GI/Abd soft, mild diffuse tenderness, nondistended, bowel sound present CNS: Alert, awake, oriented x3.  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  Recent Labs  Lab 09/13/21 0401  WBC 4.6  HGB 10.1*  HCT 31.7*  MCV 99.1  PLT 130*   Recent Labs  Lab 09/09/21 0327 09/11/21 0324 09/13/21 0401 09/14/21 0407  NA 141 138 139 139  K 3.8 3.8 3.5 3.5  CL 108 105 105 105  CO2 29 30 28  32  GLUCOSE 127*  103* 111* 128*  BUN 19 20 17 18   CREATININE 0.66 0.59* 0.51* 0.61  CALCIUM 8.7* 8.6* 8.7* 8.4*  MG  --  1.9  --  1.8  PHOS  --  3.3  --  3.1    F/u labs ordered Unresulted Labs (From admission, onward)     Start     Ordered   09/15/21 2248  Basic metabolic panel  Tomorrow morning,   R       Question:  Specimen collection method  Answer:  IV Team=IV Team collect   09/14/21 1013   09/15/21 0500  Magnesium  Tomorrow morning,   R       Question:  Specimen collection method  Answer:  IV Team=IV Team collect   09/14/21 1013   08/28/21 0500  Comprehensive metabolic panel  (TPN Lab Panel)  Every Mon,Thu (0500),   R      08/27/21 1520   08/28/21 0500  Magnesium  (TPN Lab Panel)  Every Mon,Thu (0500),   R      08/27/21 1520   08/28/21 0500  Phosphorus  (TPN Lab Panel)  Every Mon,Thu (0500),   R      08/27/21 1520   08/28/21 0500  Triglycerides  (TPN Lab Panel)  Every Monday (0500),   R      08/27/21 1520            Signed, Terrilee Croak, MD [facility] 09/14/2021

## 2021-09-14 NOTE — Progress Notes (Signed)
Mobility Specialist - Progress Note    09/14/21 1128  Mobility  Activity Ambulated in hall;Ambulated to bathroom  Level of Assistance Modified independent, requires aide device or extra time  Assistive Device Front wheel walker  Distance Ambulated (ft) 450 ft  Mobility Ambulated independently in room;Ambulated independently in hallway  Mobility Response Tolerated well  Mobility performed by Mobility specialist  $Mobility charge 1 Mobility   Upon entry pt was agreeable to ambulate and used RW to walk towards bathroom. Once finished, pt walked 450 ft in hallway. Pt returned to room after ambulating and was left in recliner with call bell at side. No complaints made during session.   [place] Specialist Acute Rehabilitation Services Phone: [phone] 09/14/21, 11:30 AM

## 2021-09-14 NOTE — Progress Notes (Signed)
[facility] Gastroenterology Progress Note  Hunter Ward 70 y.o. 1951/10/06  CC:  Necrotizing pancreatitis   Subjective: Patient reports having a bad day yesterday, he vomited about 4 times yesterday and 2 times last night. Still endorsing nausea. States he has been trying to have a BM, but hasn't been able to. States he has RLQ pain that hurts worse with movement and worse with eating. Passing flatus regularly. Continuing to drink liquids slowly.  [employer] video interpreting services was utilized for this encounter (ASL, [place], # [id number])    ROS : Review of Systems  Cardiovascular:  Negative for chest pain and palpitations.  Gastrointestinal:  Positive for abdominal pain, constipation, nausea and vomiting. Negative for blood in stool, diarrhea, heartburn and melena.     Objective: Vital signs in last 24 hours: Vitals:   09/13/21 2136 09/14/21 0513  BP: 109/63 113/67  Pulse: (!) 59 (!) 57  Resp: 16 16  Temp: 98 F (36.7 C) 98.1 F (36.7 C)  SpO2: 100% 99%    Physical Exam:  General:  Alert, cooperative, no distress, appears stated age  Head:  Normocephalic, without obvious abnormality, atraumatic  Eyes:  Anicteric sclera, EOM's intact  Lungs:   Clear to auscultation bilaterally, respirations unlabored  Heart:  Regular rate and rhythm, S1, S2 normal  Abdomen:   Soft, TTP RLQ, normal bowel sounds. No rebound tenderness.     Lab Results: Recent Labs    09/13/21 0401 09/14/21 0407  NA 139 139  K 3.5 3.5  CL 105 105  CO2 28 32  GLUCOSE 111* 128*  BUN 17 18  CREATININE 0.51* 0.61  CALCIUM 8.7* 8.4*  MG  --  1.8  PHOS  --  3.1   Recent Labs    09/14/21 0407  AST 14*  ALT 12  ALKPHOS 48  BILITOT 0.5  PROT 5.8*  ALBUMIN 2.3*   Recent Labs    09/13/21 0401  WBC 4.6  HGB 10.1*  HCT 31.7*  MCV 99.1  PLT 130*   No results for input(s): LABPROT, INR in the last 72 hours.    Assessment Necrotizing pancreatitis -CT abdomen: 10/4:There has been interval  improvement in appearance of acute pancreatitis. main duct dilatation has improved. Continued edema, peripancreatic soft tissue stranding is noted and there is persistent diffuse enhancement suggesting underlying necrosis. Chronic occlusion of the splenic vein. Large volume of free fluid in the pelvis. Unchanged appearance of bilateral pleural effusions, left greater than right. Gallstones. Persistent fusiform dilatation of the common bile duct with mild intrahepatic bile duct dilatation. Beak like narrowing of the mid and distal CBD noted at the level of the head of pancreas secondary to pancreatic edema. No signs of choledocholithiasis. - EGD 10/4: Normal esophagus. Erythematous mucosa in the cardia, gastric fundus, gastric body, greater curvature, lesser curvature, antrum and pylorus. Duodenitis. Removed plastic pancreatic stent in the duodenum. Removal of feeding tube,despite multiple attempts, unable to leave the tip in the jejunum/post pyloric area. Duodenal swelling in the second portion of duodenum appeared improved on endoscopy    Plan: CT evidence of improvement in pancreatitis. Continue supportive care.  Continue IV TPN  Continue clear liquid diet.   Miralax daily  [facility] will follow  Garnette Scheuermann PA-C 09/14/2021, 11:10 AM  Contact #  [phone]

## 2021-09-14 NOTE — Plan of Care (Signed)
  Problem: Health Behavior/Discharge Planning: Goal: Ability to manage health-related needs will improve Outcome: Progressing   Problem: Activity: Goal: Risk for activity intolerance will decrease Outcome: Progressing   Problem: Coping: Goal: Level of anxiety will decrease Outcome: Progressing   Problem: Pain Managment: Goal: General experience of comfort will improve Outcome: Progressing   Problem: Education: Goal: Knowledge of General Education information will improve Description: Including pain rating scale, medication(s)/side effects and non-pharmacologic comfort measures Outcome: Completed/Met   Problem: Clinical Measurements: Goal: Will remain free from infection Outcome: Completed/Met Goal: Respiratory complications will improve Outcome: Completed/Met

## 2021-09-14 NOTE — Progress Notes (Addendum)
PHARMACY - TOTAL PARENTERAL NUTRITION CONSULT NOTE   Indication:  NPO for necrotizing pancreatitis  Patient Measurements: Height: 5' 10"  (177.8 cm) Weight: 67.4 kg (148 lb 9.4 oz) IBW/kg (Calculated) : 73 TPN AdjBW (KG): 71.9 Body mass index is 21.32 kg/m. Usual Weight: 80 kg  Assessment: 82 yoM re-admitted for necrotizing pancreatitis and is NPO.  Initially planning for post-pyloric tube feeds, but unable to advance NGT d/t significant duodenal stricture from pancreatic abscess and surrounding edema. Pharmacy consulted to manage TPN. Significant Event: 9/26: TPN was disconnected for contrast administration CT last PM (only 1 site for IV access) and unable to restart due to infection control policy.  Currently has D10 running at 80 ml/hr infusing.  Therefore, AM labs do not reflect electrolyte from TPN formula yesterday.  NGT came out. 9/27: TPN resumed last PM.   Glucose / Insulin: No hx DM. CBG goal <180. SSI stopped 9/29 as they have been well-controlled Electrolytes: 10/6 Lytes are WNL, K borderline at 3.5, Mag borderline at 1.8 Renal: SCr, BUN, bicarb stable & WNL Hepatic: LFTs, T.bili, Alk Phos WNL (10/6) TG WNL (10/3) I/O: Strict I/O ordered.  - UOP ok, LBM 10/4 GI Imaging: - 9/15 CTa/p: progressive necrotizing pancreatitis w/ 8 cm suspected abscess at pancreatic head, multiple other loculated collections along pancreatic body; common hepatic duct dilation w/ persistent gallstones in GB. - 9/17 UGI: minimal passage of contrast past duodenal stricture, likely d/t mass effect from pancreatic abscess/edema -9/25: CT a/p necrotizing pancreatitis, relatively stable pancreatic/peripancreatic necrotic collection - 9/30 AbXR: Weighted tip feeding tube now either in the gastric pylorus or first portion of the duodenum - 10/4 CT: interval improvement in acute pancreatitis GI Surgeries / Procedures:  - 8/29: Pancreatic stent placed - 9/16: NG tube placed - 9/23: NGT placed at proximal  descending duodenum by IR - 9/26: NGT came out - 9/27: Dobhoff tube placement by IR - 10/4 EGD: removed pancreatic stent, removed feeding tube  Central access: Triple lumen PICC 9/19 TPN start date: 9/19  Nutritional Goals:  per RD Assessment (09/12/21):  Kcal: 2100-2300  Protein: 110-125 g Fluid: > 2 L/day  Current TPN formulation at goal rate of 90 mL/hr provides 114 g protein, 2163 kcals   Current Nutrition:  Clear liquids, RD added boost breeze on 10/4 (minimal intake charted), TPN  Plan:  At 1800:  Continue TPN at new goal rate of 90 mL/hr   Electrolytes in TPN:  Na - 125 mEq/L  K - 35 mEq/L Ca - 3 mEq/L Mg - 8 mEq/L Phos - 15 mmol/L Cl:Ac ratio 1:2 Add standard MVI and trace elements to TPN Continue no CBG check since they have been well-controlled, if AM glucose rises, will re-institute  No current MIVF; orders per MD if needed Monitor TPN labs on Mon/Thurs Follow up ability to tolerate diet  Gretta Arab PharmD, BCPS Clinical Pharmacist [facility] main pharmacy [phone] 09/14/2021 9:43 AM

## 2021-09-15 DIAGNOSIS — K8591 Acute pancreatitis with uninfected necrosis, unspecified: Secondary | ICD-10-CM | POA: Diagnosis not present

## 2021-09-15 LAB — GLUCOSE, CAPILLARY
Glucose-Capillary: 126 mg/dL — ABNORMAL HIGH (ref 70–99)
Glucose-Capillary: 100 mg/dL — ABNORMAL HIGH (ref 70–99)

## 2021-09-15 LAB — BASIC METABOLIC PANEL
Anion gap: 1 — ABNORMAL LOW (ref 5–15)
BUN: 17 mg/dL (ref 8–23)
CO2: 30 mmol/L (ref 22–32)
Calcium: 8.6 mg/dL — ABNORMAL LOW (ref 8.9–10.3)
Chloride: 109 mmol/L (ref 98–111)
Creatinine, Ser: 0.68 mg/dL (ref 0.61–1.24)
GFR, Estimated: >60 mL/min (ref >60)
Glucose, Bld: 106 mg/dL — ABNORMAL HIGH (ref 70–99)
Potassium: 3.7 mmol/L (ref 3.5–5.1)
Sodium: 140 mmol/L (ref 135–145)

## 2021-09-15 LAB — MAGNESIUM: Magnesium: 2 mg/dL (ref 1.7–2.4)

## 2021-09-15 MED ORDER — TRAVASOL 10 % IV SOLN
INTRAVENOUS | Status: AC
  Filled 2021-09-15: qty 1144.8

## 2021-09-15 NOTE — TOC Initial Note (Addendum)
Transition of Care [facility]) - Initial/Assessment Note    Patient Details  Name: Hunter Ward MRN: [id number] Date of Birth: [birth year]  [facility] CM/SW Contact:    Hunter Phi, RN Phone Number: 09/15/2021, 3:36 PM  Clinical Narrative: Deaf uses sign language-spoke to sister Hunter Ward about d/c plans HHPT-will check for [facility] agency to accept. [facility] reviewing.   3:55p-[employer] rep Hunter Ward accepted for HHPT.                Expected Discharge Plan: [place] Barriers to Discharge: Continued Medical Work up   Patient Goals and CMS Choice Patient states their goals for this hospitalization and ongoing recovery are:: go home-Deaf-uses sign language CMS Medicare.gov Compare Post Acute Care list provided to:: Patient Represenative (must comment) (sister Hunter Ward [phone]) Choice offered to / list presented to : Sibling  Expected Discharge Plan and Services Expected Discharge Plan: [place]   Discharge Planning Services: CM Consult Post Acute Care Choice: [place] arrangements for the past 2 months: Single Family Home                                      Prior Living Arrangements/Services Living arrangements for the past 2 months: Single Family Home Lives with:: Siblings Patient language and need for interpreter reviewed:: Yes Do you feel safe going back to the place where you live?: Yes      Need for Family Participation in Patient Care: No (Comment) Care giver support system in place?: Yes (comment) Current home services: DME (rw) Criminal Activity/Legal Involvement Pertinent to Current Situation/Hospitalization: No - Comment as needed  Activities of Daily Living Home Assistive Devices/Equipment: Walker (specify type) ADL Screening (condition at time of admission) Patient's cognitive ability adequate to safely complete daily activities?: Yes Is the patient deaf or have difficulty hearing?: Yes Does the  patient have difficulty seeing, even when wearing glasses/contacts?: No Does the patient have difficulty concentrating, remembering, or making decisions?: No Patient able to express need for assistance with ADLs?: Yes Does the patient have difficulty dressing or bathing?: No Independently performs ADLs?: Yes (appropriate for developmental age) Does the patient have difficulty walking or climbing stairs?: Yes Weakness of Legs: Both Weakness of Arms/Hands: Both  Permission Sought/Granted Permission sought to share information with : Case Manager Permission granted to share information with : Yes, Verbal Permission Granted  Share Information with NAME: Case Manager           Emotional Assessment Appearance:: Appears stated age Attitude/Demeanor/Rapport: Gracious Affect (typically observed): Accepting Orientation: : Oriented to Self, Oriented to Place, Oriented to  Time Alcohol / Substance Use: Not Applicable Psych Involvement: No (comment)  Admission diagnosis:  Acute necrotizing pancreatitis [K85.91] Splenic vein thrombosis [I82.890] Necrotizing pancreatitis [K85.91] Pancreatic abscess [K85.90] Patient Active Problem List   Diagnosis Date Noted   Protein-calorie malnutrition, severe 09/12/2021   Pancreatic abscess 08/24/2021   Necrotizing pancreatitis 08/24/2021   Normocytic anemia 08/24/2021   Hyponatremia 08/24/2021   Mild protein malnutrition ([place]) 08/24/2021   Deafness 08/24/2021   Acute pancreatitis 08/05/2021   HTN (hypertension) 08/05/2021   Serum total bilirubin elevated 08/05/2021   Transaminitis 08/05/2021   PCP:  Hunter Arabian, MD Pharmacy:   [facility] [place], [place] [address]  Phone: [phone] Fax: [phone]     Social Determinants of Health (SDOH) Interventions    Readmission Risk Interventions No flowsheet data found.

## 2021-09-15 NOTE — Progress Notes (Signed)
   09/15/21 1500  Mobility  Activity Ambulated in hall  Level of Assistance Modified independent, requires aide device or extra time  Assistive Device Front wheel walker  Distance Ambulated (ft) 400 ft  Mobility Ambulated with assistance in hallway  Mobility Response Tolerated well  Mobility performed by Mobility specialist  $Mobility charge 1 Mobility   Start: 2:48p End: 3:03p  Pt agreeable to mobilize this afternoon. Ambulated in hall about 418ft with RW, tolerated well. Took 1 resting break about halfway. Left pt in chair with call bell at side. Rn notified of session.    [place] Specialist Acute Rehab Services Office: [phone]

## 2021-09-15 NOTE — Plan of Care (Signed)
  Problem: Health Behavior/Discharge Planning: Goal: Ability to manage health-related needs will improve Outcome: Progressing   Problem: Clinical Measurements: Goal: Diagnostic test results will improve Outcome: Progressing   

## 2021-09-15 NOTE — Progress Notes (Signed)
PHARMACY - TOTAL PARENTERAL NUTRITION CONSULT NOTE   Indication:  NPO for necrotizing pancreatitis  Patient Measurements: Height: _0  (177.8 cm) Weight: 67.4 kg (148 lb 9.4 oz) IBW/kg (Calculated) : 73 TPN AdjBW (KG): 71.9 Body mass index is 21.32 kg/m. Usual Weight: 80 kg  Assessment: 62 yoM re-admitted for necrotizing pancreatitis and is NPO.  Initially planning for post-pyloric tube feeds, but unable to advance NGT d/t significant duodenal stricture from pancreatic abscess and surrounding edema. Pharmacy consulted to manage TPN. Significant Event: 9/26: TPN was disconnected for contrast administration CT last PM (only 1 site for IV access) and unable to restart due to infection control policy.  Currently has D10 running at 80 ml/hr infusing.  Therefore, AM labs do not reflect electrolyte from TPN formula yesterday.  NGT came out.  Glucose / Insulin: No hx DM. CBG goal <180. SSI stopped 9/29 as they have been well-controlled Electrolytes: 10/6 Lytes are WNL, K 3.7, Mag 2 Renal: SCr, BUN, bicarb stable & WNL Hepatic: LFTs, T.bili, Alk Phos WNL (10/6) TG WNL (10/3) I/O: Strict I/O ordered.  - UOP adequate, LBM 10/6 GI Imaging: - 9/15 CTa/p: progressive necrotizing pancreatitis w/ 8 cm suspected abscess at pancreatic head, multiple other loculated collections along pancreatic body; common hepatic duct dilation w/ persistent gallstones in GB. - 9/17 UGI: minimal passage of contrast past duodenal stricture, likely d/t mass effect from pancreatic abscess/edema -9/25: CT a/p necrotizing pancreatitis, relatively stable pancreatic/peripancreatic necrotic collection - 9/30 AbXR: Weighted tip feeding tube now either in the gastric pylorus or first portion of the duodenum - 10/4 CT: interval improvement in acute pancreatitis GI Surgeries / Procedures:  - 8/29: Pancreatic stent placed - 9/16: NG tube placed - 9/23: NGT placed at proximal descending duodenum by IR - 9/26: NGT came out -  9/27: Dobhoff tube placement by IR - 10/4 EGD: removed pancreatic stent, removed feeding tube  Central access: Triple lumen PICC 9/19 TPN start date: 9/19  Nutritional Goals:  per RD Assessment (09/12/21):  Kcal: 2100-2300  Protein: 110-125 g Fluid: > 2 L/day  Current TPN formulation at goal rate of 90 mL/hr provides 114 g protein, 2163 kcals   Current Nutrition:  Clear liquids, RD added boost breeze on 10/4 (minimal intake charted), TPN  Plan:  At 1800:  Continue TPN at new goal rate of 90 mL/hr   Electrolytes in TPN:  Na - 125 mEq/L  K - 35 mEq/L Ca - 3 mEq/L Mg - 8 mEq/L Phos - 15 mmol/L Cl:Ac ratio 1:2 Add standard MVI and trace elements to TPN Continue no CBG check since they have been well-controlled, if AM glucose rises, will re-institute  No current MIVF; orders per MD if needed Monitor TPN labs on Mon/Thurs Follow up ability to tolerate diet  Gretta Arab PharmD, BCPS Clinical Pharmacist [facility] main pharmacy [phone] 09/15/2021 7:51 AM

## 2021-09-15 NOTE — Progress Notes (Signed)
PROGRESS NOTE  Hunter Ward  DOB: [birth year]  PCP: Gaynelle Arabian, MD PIR:[id number]  DOA: 08/24/2021  LOS: 21 days  Hospital Day: 23   Chief Complaint  Patient presents with   Fever   Emesis   Abdominal Pain    Brief narrative: WHITAKER HOLDERMAN is a 70 y.o. male with PMH significant for HTN, deaf mutism. Patient presented to the ED on 9/15 with complaint of worsening abdominal pain, nausea, vomiting. He was found to have evidence of progressive necrotizing pancreatitis. Admitted to hospitalist service GI consultation was obtained. Attempt for enteral nutrition failed.  TPN was started See below for details  Subjective: Patient was seen and examined this morning.  Lying on bed.  Not in distress.  No family at bedside today.  I communicated with him by writing.  Reports a bowel movement today.  Ongoing TPN feeding.  On clear liquid diet.  Assessment/Plan: Necrotizing pancreatitis -Patient remains hospitalized with progressive necrotizing pancreatitis which is complicated by suspected abscess.  Currently on IV Rocephin.  GI and IR following.  IR tried drain placement however it was not successful. -On 10/4, CT abdomen was repeated which showed an interval improvement in the appearance of acute pancreatitis.  An EGD was tried to advance NJ tube to leave the tip in the jejunum/postpyloric area but it failed.   -Currently patient is getting TPN through PICC line. -Currently also on clear liquid diet, IV antiemetic as needed and TPN feeding.  Monitor electrolytes while on TPN. Recent Labs  Lab 09/09/21 0327 09/11/21 0324 09/13/21 0401 09/14/21 0407 09/15/21 0435  K 3.8 3.8 3.5 3.5 3.7  MG  --  1.9  --  1.8 2.0  PHOS  --  3.3  --  3.1  --    Third spacing of fluid -Related to pancreatitis.  Per CT scan from 10/4, patient has a large volume of free fluid in the pelvis, bilateral pleural effusion left greater than right. -Hemodynamically stable at this time.  Cholelithiasis,  CBD dilatation  -CT abdomen from 10/4 showed gallstones and also persistent fusiform dilatation of the common bile duct with mild intrahepatic bile duct dilatation. Beak like narrowing of the mid and distal CBD noted at the level of the head of pancreas secondary to pancreatic edema. -No signs of choledocholithiasis. -Continue to monitor liver enzymes. 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  No anticoagulation in the setting of necrotizing pancreatitis for the risk of transformation to hemorrhagic pancreatitis.  Essential hypertension -On nifedipine, lisinopril as an outpatient -Blood pressure medicines currently on hold  Deaf mutism -ASL interpreter.  Family at bedside was able to help out for me today.  Chronic anemia -Mild chronic anemia.  Currently stable above 10.  No evidence of bleeding. Recent Labs    08/27/21 0511 08/28/21 0425 08/29/21 0326 09/05/21 0339 09/13/21 0401  HGB 10.3* 10.4* 10.2* 10.2* 10.1*  MCV 98.1 98.5 98.1 97.8 99.1    Constipation -As needed Dulcolax.  Mobility: Encourage ambulation Code Status:   Code Status: Full Code  Nutritional status: Body mass index is 21.32 kg/m. Nutrition Problem: Severe Malnutrition Etiology: acute illness (pancreatitis) Signs/Symptoms: severe fat depletion, severe muscle depletion, energy intake < or equal to 50% for > or equal to 5 days, percent weight loss Percent weight loss: 17 %  Diet:  Diet Order             Diet clear liquid Room service appropriate? Yes; Fluid consistency: Thin  Diet effective now                  DVT prophylaxis:  enoxaparin (LOVENOX) injection 40 mg Start: 09/12/21 1000   Antimicrobials: Completed a course of IV Zosyn Fluid: TPN Consultants: GI Family Communication: Family not at bedside  Status is:  Inpatient  Remains inpatient appropriate because: Continues to require TPN for necrotizing pancreatitis Dispo: The patient is from: Home              Anticipated d/c is to: Home in next several days              Patient currently is not medically stable to d/c.   Difficult to place patient No     Infusions:   TPN ADULT (ION) 90 mL/hr at 09/14/21 1724   TPN ADULT (ION)      Scheduled Meds:  Chlorhexidine Gluconate Cloth  6 each Topical Daily   enoxaparin (LOVENOX) injection  40 mg Subcutaneous Daily   feeding supplement  1 Container Oral TID BM   pantoprazole (PROTONIX) IV  40 mg Intravenous Q24H   polyethylene glycol  17 g Oral Daily    Antimicrobials: Anti-infectives (From admission, onward)    Start     Dose/Rate Route Frequency Ordered Stop   08/25/21 0200  piperacillin-tazobactam (ZOSYN) IVPB 3.375 g        3.375 g 12.5 mL/hr over 240 Minutes Intravenous Every 8 hours 08/24/21 2009 09/08/21 0854   08/24/21 2200  piperacillin-tazobactam (ZOSYN) IVPB 3.375 g  Status:  Discontinued        3.375 g 100 mL/hr over 30 Minutes Intravenous Every 8 hours 08/24/21 1931 08/24/21 2009   08/24/21 1915  piperacillin-tazobactam (ZOSYN) IVPB 3.375 g        3.375 g 100 mL/hr over 30 Minutes Intravenous  Once 08/24/21 1911 08/24/21 2030       PRN meds: acetaminophen **OR** acetaminophen, bisacodyl, HYDROmorphone (DILAUDID) injection, lidocaine, ondansetron **OR** ondansetron (ZOFRAN) IV, phenol, prochlorperazine, sodium chloride flush   Objective: Vitals:   09/14/21 2038 09/15/21 0519  BP: 118/72 106/64  Pulse: 80 (!) 56  Resp: 18 14  Temp: 98.4 F (36.9 C) 98.2 F (36.8 C)  SpO2: 100% 98%    Intake/Output Summary (Last 24 hours) at 09/15/2021 1325 Last data filed at 09/15/2021 0834 Gross per 24 hour  Intake 2430.53 ml  Output 1550 ml  Net 880.53 ml    Filed Weights   08/28/21 0626 09/04/21 1209 09/10/21 1521  Weight: 77.2 kg 67.1 kg 67.4 kg   Weight change:  Body  mass index is 21.32 kg/m.   Physical Exam: General exam: Pleasant, elderly Caucasian male.  Not in distress Skin: No rashes, lesions or ulcers. HEENT: Atraumatic, normocephalic, no obvious bleeding Lungs: Clear to auscultation bilaterally CVS: Regular rate and rhythm, no murmur GI/Abd soft, mild tenderness in lower abdomen, nondistended, bowel sound present CNS: Alert, awake, oriented x3.  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  Recent Labs  Lab 09/13/21 0401  WBC 4.6  HGB 10.1*  HCT 31.7*  MCV 99.1  PLT 130*    Recent Labs  Lab 09/09/21 0327 09/11/21 0324 09/13/21 0401 09/14/21 0407 09/15/21 0435  NA 141 138 139  139 140  K 3.8 3.8 3.5 3.5 3.7  CL 108 105 105 105 109  CO2 29 30 28  32 30  GLUCOSE 127* 103* 111* 128* 106*  BUN 19 20 17 18 17   CREATININE 0.66 0.59* 0.51* 0.61 0.68  CALCIUM 8.7* 8.6* 8.7* 8.4* 8.6*  MG  --  1.9  --  1.8 2.0  PHOS  --  3.3  --  3.1  --      F/u labs ordered Unresulted Labs (From admission, onward)     Start     Ordered   08/28/21 0500  Comprehensive metabolic panel  (TPN Lab Panel)  Every Mon,Thu (0500),   R      08/27/21 1520   08/28/21 0500  Magnesium  (TPN Lab Panel)  Every Mon,Thu (0500),   R      08/27/21 1520   08/28/21 0500  Phosphorus  (TPN Lab Panel)  Every Mon,Thu (0500),   R      08/27/21 1520   08/28/21 0500  Triglycerides  (TPN Lab Panel)  Every Monday (0500),   R      08/27/21 1520            Signed, Terrilee Croak, MD [facility] 09/15/2021

## 2021-09-15 NOTE — Progress Notes (Signed)
[facility] Gastroenterology Progress Note  Hunter Ward 70 y.o. 08/13/1951  CC:  Necrotizing pancreatitis   Subjective: Patient states he is "doing well." He was able to have a BM, normal and brown. States he feels much better after having a BM. Still has some abdominal soreness. States nausea and vomiting is much improved. Able to tolerate small amounts of liquids and walking often.  [employer] video interpreting services was utilized for this encounter (ASL, Mel, # [id number])   ROS : Review of Systems  Gastrointestinal:  Positive for abdominal pain. Negative for blood in stool, constipation, diarrhea, heartburn, melena, nausea and vomiting.     Objective: Vital signs in last 24 hours: Vitals:   09/14/21 2038 09/15/21 0519  BP: 118/72 106/64  Pulse: 80 (!) 56  Resp: 18 14  Temp: 98.4 F (36.9 C) 98.2 F (36.8 C)  SpO2: 100% 98%    Physical Exam:  General:  Alert, cooperative, no distress  Head:  Normocephalic, without obvious abnormality, atraumatic  Eyes:  Anicteric sclera, EOM's intact  Lungs:   Clear to auscultation bilaterally, respirations unlabored  Heart:  Regular rate and rhythm, S1, S2 normal  Abdomen:   Soft, non-tender, bowel sounds active all four quadrants,  no masses,     Lab Results: Recent Labs    09/14/21 0407 09/15/21 0435  NA 139 140  K 3.5 3.7  CL 105 109  CO2 32 30  GLUCOSE 128* 106*  BUN 18 17  CREATININE 0.61 0.68  CALCIUM 8.4* 8.6*  MG 1.8 2.0  PHOS 3.1  --    Recent Labs    09/14/21 0407  AST 14*  ALT 12  ALKPHOS 48  BILITOT 0.5  PROT 5.8*  ALBUMIN 2.3*   Recent Labs    09/13/21 0401  WBC 4.6  HGB 10.1*  HCT 31.7*  MCV 99.1  PLT 130*   No results for input(s): LABPROT, INR in the last 72 hours.    Assessment Necrotizing pancreatitis -CT abdomen: 10/4:There has been interval improvement in appearance of acute pancreatitis. main duct dilatation has improved. Continued edema, peripancreatic soft tissue stranding is noted and  there is persistent diffuse enhancement suggesting underlying necrosis. Chronic occlusion of the splenic vein. Large volume of free fluid in the pelvis. Unchanged appearance of bilateral pleural effusions, left greater than right. Gallstones. Persistent fusiform dilatation of the common bile duct with mild intrahepatic bile duct dilatation. Beak like narrowing of the mid and distal CBD noted at the level of the head of pancreas secondary to pancreatic edema. No signs of choledocholithiasis. - EGD 10/4: Normal esophagus. Erythematous mucosa in the cardia, gastric fundus, gastric body, greater curvature, lesser curvature, antrum and pylorus. Duodenitis. Removed plastic pancreatic stent in the duodenum. Removal of feeding tube,despite multiple attempts, unable to leave the tip in the jejunum/post pyloric area. Duodenal swelling in the second portion of duodenum appeared improved on endoscopy    Plan: CT evidence of improvement in pancreatitis. Continue supportive care.  Continue to encourage mobilization, walking around hallways, etc.   Continue IV TPN   Continue clear liquid diet.    [facility] will follow  Garnette Scheuermann PA-C 09/15/2021, 8:16 AM  Contact #  [phone]

## 2021-09-16 DIAGNOSIS — K8591 Acute pancreatitis with uninfected necrosis, unspecified: Secondary | ICD-10-CM | POA: Diagnosis not present

## 2021-09-16 MED ORDER — TRAVASOL 10 % IV SOLN
INTRAVENOUS | Status: AC
  Filled 2021-09-16: qty 1144.8

## 2021-09-16 NOTE — Progress Notes (Signed)
PHARMACY - TOTAL PARENTERAL NUTRITION CONSULT NOTE   Indication:  NPO for necrotizing pancreatitis  Patient Measurements: Height: 5' 10" (177.8 cm) Weight: 67.4 kg (148 lb 9.4 oz) IBW/kg (Calculated) : 73 TPN AdjBW (KG): 71.9 Body mass index is 21.32 kg/m. Usual Weight: 80 kg  Assessment: 52 yoM re-admitted for necrotizing pancreatitis and is NPO.  Initially planning for post-pyloric tube feeds, but unable to advance NGT d/t significant duodenal stricture from pancreatic abscess and surrounding edema. Pharmacy consulted to manage TPN. Significant Event: 9/26: TPN was disconnected for contrast administration CT last PM (only 1 site for IV access) and unable to restart due to infection control policy.  Currently has D10 running at 80 ml/hr infusing.  Therefore, AM labs do not reflect electrolyte from TPN formula yesterday.  NGT came out.  Glucose / Insulin: No hx DM. CBG goal <180. SSI stopped 9/29 as they have been well-controlled Electrolytes: 10/7 Lytes are WNL, K 3.7, Mag 2 Renal: SCr, BUN, bicarb stable & WNL Hepatic: LFTs, T.bili, Alk Phos WNL (10/6) TG WNL (10/3) I/O: Strict I/O ordered.  - UOP adequate, LBM 10/6 GI Imaging: - 9/15 CTa/p: progressive necrotizing pancreatitis w/ 8 cm suspected abscess at pancreatic head, multiple other loculated collections along pancreatic body; common hepatic duct dilation w/ persistent gallstones in GB. - 9/17 UGI: minimal passage of contrast past duodenal stricture, likely d/t mass effect from pancreatic abscess/edema -9/25: CT a/p necrotizing pancreatitis, relatively stable pancreatic/peripancreatic necrotic collection - 9/30 AbXR: Weighted tip feeding tube now either in the gastric pylorus or first portion of the duodenum - 10/4 CT: interval improvement in acute pancreatitis GI Surgeries / Procedures:  - 8/29: Pancreatic stent placed - 9/16: NG tube placed - 9/23: NGT placed at proximal descending duodenum by IR - 9/26: NGT came out -  9/27: Dobhoff tube placement by IR - 10/4 EGD: removed pancreatic stent, removed feeding tube  Central access: Triple lumen PICC 9/19 TPN start date: 9/19  Nutritional Goals:  per RD Assessment (09/12/21):  Kcal: 2100-2300  Protein: 110-125 g Fluid: > 2 L/day  Current TPN formulation at goal rate of 90 mL/hr provides 114 g protein, 2163 kcals   Current Nutrition:  Clear liquids, RD added boost breeze on 10/4 (minimal intake charted), TPN  Plan:  At 1800:  Continue TPN at new goal rate of 90 mL/hr   Electrolytes in TPN:  Na - 125 mEq/L  K - 35 mEq/L Ca - 3 mEq/L Mg - 8 mEq/L Phos - 15 mmol/L Cl:Ac ratio 1:2 Continue standard MVI and trace elements to TPN Continue no CBG check since they have been well-controlled, if AM glucose rises, will re-institute  No current MIVF; orders per MD if needed Monitor TPN labs on Mon/Thurs BMET, Mg, and phos in AM Follow up ability to tolerate diet  Napoleon Form  09/16/2021 8:57 AM

## 2021-09-16 NOTE — Progress Notes (Signed)
PROGRESS NOTE  Hunter Ward  DOB: 07/05/1951  PCP: Gaynelle Arabian, MD FIE:[id number]  DOA: 08/24/2021  LOS: 27 days  Hospital Day: 24   Chief Complaint  Patient presents with   Fever   Emesis   Abdominal Pain    Brief narrative: Hunter Ward is a 70 y.o. male with PMH significant for HTN, deaf mutism. Patient presented to the ED on 9/15 with complaint of worsening abdominal pain, nausea, vomiting. He was found to have evidence of progressive necrotizing pancreatitis. Admitted to hospitalist service GI consultation was obtained. Attempt for enteral nutrition failed.  TPN was started See below for details  Subjective: Patient was seen and examined this morning.  Communicated via writing. Lying down in bed.  Not in distress.  Complains of abdominal pain today which he did not have yesterday.  Had small bowel movements and 2 episodes of vomiting.  Assessment/Plan: Severe necrotizing pancreatitis -Patient remains hospitalized with progressive necrotizing pancreatitis which is complicated by suspected abscess.  Currently on IV Rocephin.  GI and IR following.  IR tried drain placement however it was not successful. -On 10/4, CT abdomen was repeated which showed an interval improvement in the appearance of acute pancreatitis.  An EGD was tried to advance NJ tube to leave the tip in the jejunum/postpyloric area but it failed.   -Currently patient is getting TPN through PICC line. -Currently also on clear liquid diet, IV antiemetic as needed and TPN feeding.  Monitor electrolytes while on TPN. -Noted a plan from GI to continue clear liquid diet only for now.   -Electrolyte levels stable Recent Labs  Lab 09/11/21 0324 09/13/21 0401 09/14/21 0407 09/15/21 0435  K 3.8 3.5 3.5 3.7  MG 1.9  --  1.8 2.0  PHOS 3.3  --  3.1  --     Third spacing of fluid -Related to pancreatitis.  Per CT scan from 10/4, patient has a large volume of free fluid in the pelvis, bilateral pleural  effusion left greater than right. -Hemodynamically stable at this time.  Cholelithiasis, CBD dilatation  -CT abdomen from 10/4 showed gallstones and also persistent fusiform dilatation of the common bile duct with mild intrahepatic bile duct dilatation. Beak like narrowing of the mid and distal CBD noted at the level of the head of pancreas secondary to pancreatic edema. -No signs of choledocholithiasis. -Continue to monitor liver enzymes. 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  No anticoagulation in the setting of necrotizing pancreatitis for the risk of transformation to hemorrhagic pancreatitis.  Essential hypertension -On nifedipine, lisinopril as an outpatient -Blood pressure medicines currently on hold  Deaf mutism -Supportive care.  ASL interpreter via iPad for communication.  Chronic anemia -Mild chronic anemia.  Currently stable above 10.  No evidence of bleeding. Recent Labs    08/27/21 0511 08/28/21 0425 08/29/21 0326 09/05/21 0339 09/13/21 0401  HGB 10.3* 10.4* 10.2* 10.2* 10.1*  MCV 98.1 98.5 98.1 97.8 99.1    Constipation -As needed Dulcolax.  Mobility: Encourage ambulation Code Status:   Code Status: Full Code  Nutritional status: Body mass index is 21.32 kg/m. Nutrition Problem: Severe Malnutrition Etiology: acute illness (pancreatitis) Signs/Symptoms: severe fat depletion, severe muscle depletion, energy intake < or equal to 50% for > or equal to 5 days, percent weight loss  Percent weight loss: 17 % Diet:  Diet Order             Diet clear liquid Room service appropriate? Yes; Fluid consistency: Thin  Diet effective now                  DVT prophylaxis:  enoxaparin (LOVENOX) injection 40 mg Start: 09/12/21 1000   Antimicrobials: Completed a course of IV Zosyn Fluid:  TPN Consultants: GI Family Communication: Family not at bedside  Status is: Inpatient  Remains inpatient appropriate because: Continues to require TPN for necrotizing pancreatitis Dispo: The patient is from: Home              Anticipated d/c is to: Home in next several days              Patient currently is not medically stable to d/c.   Difficult to place patient No     Infusions:   TPN ADULT (ION) 90 mL/hr at 09/16/21 0159   TPN ADULT (ION)      Scheduled Meds:  Chlorhexidine Gluconate Cloth  6 each Topical Daily   enoxaparin (LOVENOX) injection  40 mg Subcutaneous Daily   feeding supplement  1 Container Oral TID BM   pantoprazole (PROTONIX) IV  40 mg Intravenous Q24H   polyethylene glycol  17 g Oral Daily    Antimicrobials: Anti-infectives (From admission, onward)    Start     Dose/Rate Route Frequency Ordered Stop   08/25/21 0200  piperacillin-tazobactam (ZOSYN) IVPB 3.375 g        3.375 g 12.5 mL/hr over 240 Minutes Intravenous Every 8 hours 08/24/21 2009 09/08/21 0854   08/24/21 2200  piperacillin-tazobactam (ZOSYN) IVPB 3.375 g  Status:  Discontinued        3.375 g 100 mL/hr over 30 Minutes Intravenous Every 8 hours 08/24/21 1931 08/24/21 2009   08/24/21 1915  piperacillin-tazobactam (ZOSYN) IVPB 3.375 g        3.375 g 100 mL/hr over 30 Minutes Intravenous  Once 08/24/21 1911 08/24/21 2030       PRN meds: acetaminophen **OR** acetaminophen, bisacodyl, HYDROmorphone (DILAUDID) injection, lidocaine, ondansetron **OR** ondansetron (ZOFRAN) IV, phenol, prochlorperazine, sodium chloride flush   Objective: Vitals:   09/16/21 0502 09/16/21 1250  BP: 109/66 118/72  Pulse: (!) 54 (!) 59  Resp: 16 15  Temp: 98.5 F (36.9 C) 97.8 F (36.6 C)  SpO2: 100% 100%    Intake/Output Summary (Last 24 hours) at 09/16/2021 1331 Last data filed at 09/16/2021 1241 Gross per 24 hour  Intake 1927.11 ml  Output 2350 ml  Net -422.89 ml    Filed Weights   08/28/21 0626  09/04/21 1209 09/10/21 1521  Weight: 77.2 kg 67.1 kg 67.4 kg   Weight change:  Body mass index is 21.32 kg/m.   Physical Exam: General exam: Pleasant, elderly Caucasian male.  Not in distress Skin: No rashes, lesions or ulcers. HEENT: Atraumatic, normocephalic, no obvious bleeding Lungs: Clear to auscultation bilaterally CVS: Regular rate and rhythm, no murmur GI/Abd soft, moderate diffuse tenderness in lower abdomen, nondistended, bowel sound present CNS: Alert, awake, oriented x3.  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  Recent Labs  Lab 09/13/21 0401  WBC 4.6  HGB 10.1*  HCT 31.7*  MCV 99.1  PLT 130*    Recent Labs  Lab 09/11/21 0324 09/13/21 0401 09/14/21 0407 09/15/21 0435  NA 138 139 139 140  K 3.8 3.5 3.5 3.7  CL 105 105 105 109  CO2 30 28 32 30  GLUCOSE 103* 111* 128* 106*  BUN 20 17 18 17   CREATININE 0.59* 0.51* 0.61 0.68  CALCIUM 8.6* 8.7* 8.4* 8.6*  MG 1.9  --  1.8 2.0  PHOS 3.3  --  3.1  --      F/u labs ordered Unresulted Labs (From admission, onward)     Start     Ordered   09/17/21 9030  Basic metabolic panel  Tomorrow morning,   R       Question:  Specimen collection method  Answer:  IV Team=IV Team collect   09/16/21 0935   09/17/21 0500  Magnesium  Tomorrow morning,   R       Question:  Specimen collection method  Answer:  IV Team=IV Team collect   09/16/21 0935   09/17/21 0500  Phosphorus  Tomorrow morning,   R       Question:  Specimen collection method  Answer:  IV Team=IV Team collect   09/16/21 0935   08/28/21 0500  Comprehensive metabolic panel  (TPN Lab Panel)  Every Mon,Thu (0500),   R      08/27/21 1520   08/28/21 0500  Magnesium  (TPN Lab Panel)  Every Mon,Thu (0500),   R      08/27/21 1520   08/28/21 0500  Phosphorus  (TPN Lab Panel)  Every Mon,Thu (0500),   R      08/27/21  1520   08/28/21 0500  Triglycerides  (TPN Lab Panel)  Every Monday (0500),   R      08/27/21 1520            Signed, Terrilee Croak, MD [facility] 09/16/2021

## 2021-09-16 NOTE — Progress Notes (Signed)
Mobility Specialist - Progress Note    09/16/21 1320  Mobility  Activity Ambulated in hall  Level of Assistance Modified independent, requires aide device or extra time  Assistive Device Front wheel walker  Distance Ambulated (ft) 350 ft  Mobility Ambulated independently in hallway  Mobility Response Tolerated well  Mobility performed by Mobility specialist  $Mobility charge 1 Mobility   Pt ambulated 350 ft in hallway using RW and did not show signs of pain, dizziness, or SOB. Pt returned to room after session and was left in bed with call bell at side.   [place] Specialist [facility] Services Phone: [phone] 09/16/21, 1:21 PM

## 2021-09-16 NOTE — Progress Notes (Signed)
Subjective: ALS interpreter could not be used today-despite waiting for several minutes, interpreter was not available, tried twice. Communicated via writing with the patient who states he has more abdominal discomfort today. Has had liquid bowel movements, but thinks his nausea is worsened with TPN.  Objective: Vital signs in last 24 hours: Temp:  [98 F (36.7 C)-98.5 F (36.9 C)] 98.5 F (36.9 C) (10/08 0502) Pulse Rate:  [54-63] 54 (10/08 0502) Resp:  [16-20] 16 (10/08 0502) BP: (109-116)/(61-66) 109/66 (10/08 0502) SpO2:  [99 %-100 %] 100 % (10/08 0502) Weight change:  Last BM Date: 09/15/21  PE: Pleasant, mild pallor GENERAL: Does not appear to be in distress  ABDOMEN: Soft, upper abdomen is tender, bowel sounds audible EXTREMITIES: No deformity  Lab Results: Results for orders placed or performed during the hospital encounter of 08/24/21 (from the past 48 hour(s))  Basic metabolic panel     Status: Abnormal   Collection Time: 09/15/21  4:35 AM  Result Value Ref Range   Sodium 140 135 - 145 mmol/L   Potassium 3.7 3.5 - 5.1 mmol/L   Chloride 109 98 - 111 mmol/L   CO2 30 22 - 32 mmol/L   Glucose, Bld 106 (H) 70 - 99 mg/dL    Comment: Glucose reference range applies only to samples taken after fasting for at least 8 hours.   BUN 17 8 - 23 mg/dL   Creatinine, Ser 0.68 0.61 - 1.24 mg/dL   Calcium 8.6 (L) 8.9 - 10.3 mg/dL   GFR, Estimated >60 >60 mL/min    Comment: (NOTE) Calculated using the CKD-EPI Creatinine Equation (2021)    Anion gap 1 (L) 5 - 15    Comment: Performed at [facility], [place] [address], [place], [place] [address]  Magnesium     Status: None   Collection Time: 09/15/21  4:35 AM  Result Value Ref Range   Magnesium 2.0 1.7 - 2.4 mg/dL    Comment: Performed at [facility], [place] [address] [place], [place] [address]  Glucose, capillary     Status: Abnormal   Collection Time: 09/15/21  2:04 PM  Result Value Ref  Range   Glucose-Capillary 126 (H) 70 - 99 mg/dL    Comment: Glucose reference range applies only to samples taken after fasting for at least 8 hours.  Glucose, capillary     Status: Abnormal   Collection Time: 09/15/21  5:43 PM  Result Value Ref Range   Glucose-Capillary 100 (H) 70 - 99 mg/dL    Comment: Glucose reference range applies only to samples taken after fasting for at least 8 hours.    Studies/Results: No results found.  Medications: I have reviewed the patient's current medications.  Assessment: Severe necrotizing pancreatitis Last imaging on 09/12/2021 showed decrease in size of pancreatic pseudocyst, with changes of necrosis, however interval improvement in acute pancreatitis Patient has gallbladder remnant with multiple stones CBD has been cleared of CBD stones via ERCP, pancreatic stent has been removed Decrease in duodenal swelling noted on recent endoscopy.  Plan: Continue IV TPN Continue clear liquid diet until complete resolution of vomiting, thereafter we may start full liquids Supportive management.  Ronnette Juniper, MD 09/16/2021, 12:01 PM

## 2021-09-16 NOTE — Plan of Care (Signed)
  Problem: Health Behavior/Discharge Planning: Goal: Ability to manage health-related needs will improve Outcome: Progressing   Problem: Clinical Measurements: Goal: Ability to maintain clinical measurements within normal limits will improve Outcome: Progressing Goal: Diagnostic test results will improve Outcome: Progressing   Problem: Activity: Goal: Risk for activity intolerance will decrease Outcome: Progressing   Problem: Nutrition: Goal: Adequate nutrition will be maintained Outcome: Progressing   Problem: Coping: Goal: Level of anxiety will decrease Outcome: Progressing

## 2021-09-17 DIAGNOSIS — K8591 Acute pancreatitis with uninfected necrosis, unspecified: Secondary | ICD-10-CM | POA: Diagnosis not present

## 2021-09-17 LAB — BASIC METABOLIC PANEL
Anion gap: 4 — ABNORMAL LOW (ref 5–15)
BUN: 20 mg/dL (ref 8–23)
CO2: 30 mmol/L (ref 22–32)
Calcium: 8.3 mg/dL — ABNORMAL LOW (ref 8.9–10.3)
Chloride: 104 mmol/L (ref 98–111)
Creatinine, Ser: 0.58 mg/dL — ABNORMAL LOW (ref 0.61–1.24)
GFR, Estimated: >60 mL/min (ref >60)
Glucose, Bld: 118 mg/dL — ABNORMAL HIGH (ref 70–99)
Potassium: 4.3 mmol/L (ref 3.5–5.1)
Sodium: 138 mmol/L (ref 135–145)

## 2021-09-17 LAB — GLUCOSE, CAPILLARY: Glucose-Capillary: 115 mg/dL — ABNORMAL HIGH (ref 70–99)

## 2021-09-17 LAB — PHOSPHORUS: Phosphorus: 3.4 mg/dL (ref 2.5–4.6)

## 2021-09-17 LAB — MAGNESIUM: Magnesium: 2 mg/dL (ref 1.7–2.4)

## 2021-09-17 MED ORDER — TRAVASOL 10 % IV SOLN
INTRAVENOUS | Status: AC
  Filled 2021-09-17: qty 1144.8

## 2021-09-17 NOTE — Progress Notes (Signed)
Subjective: ALS interpreter [phone] Leveda Anna used for sign language interpretation. Patient states he had 2 episodes of emesis yesterday, none today. He reports having several bowel movements, small but more formed stool than liquid. Abdominal pain is 5-6 out of 10 in intensity, more so located in the lower pelvis and upper abdomen. He wants his diet to be advanced and wants to try vanilla pudding and possibly fruits today.  Objective: Vital signs in last 24 hours: Temp:  [97.8 F (36.6 C)-98 F (36.7 C)] 98 F (36.7 C) (10/09 0439) Pulse Rate:  [59-67] 67 (10/09 0439) Resp:  [15-16] 16 (10/09 0439) BP: (105-118)/(54-72) 105/54 (10/09 0439) SpO2:  [97 %-100 %] 97 % (10/09 0439) Weight change:  Last BM Date: 09/16/21  PE: Appears comfortable, mild pallor, no icterus GENERAL: Not in distress  ABDOMEN: Soft, tenderness appreciated in suprapubic and lower abdominal area, hypoactive bowel sounds EXTREMITIES: No deformity, receiving IV TPN  Lab Results: Results for orders placed or performed during the hospital encounter of 08/24/21 (from the past 48 hour(s))  Glucose, capillary     Status: Abnormal   Collection Time: 09/15/21  2:04 PM  Result Value Ref Range   Glucose-Capillary 126 (H) 70 - 99 mg/dL    Comment: Glucose reference range applies only to samples taken after fasting for at least 8 hours.  Glucose, capillary     Status: Abnormal   Collection Time: 09/15/21  5:43 PM  Result Value Ref Range   Glucose-Capillary 100 (H) 70 - 99 mg/dL    Comment: Glucose reference range applies only to samples taken after fasting for at least 8 hours.  Basic metabolic panel     Status: Abnormal   Collection Time: 09/17/21  4:26 AM  Result Value Ref Range   Sodium 138 135 - 145 mmol/L   Potassium 4.3 3.5 - 5.1 mmol/L   Chloride 104 98 - 111 mmol/L   CO2 30 22 - 32 mmol/L   Glucose, Bld 118 (H) 70 - 99 mg/dL    Comment: Glucose reference range applies only to samples taken after fasting for at  least 8 hours.   BUN 20 8 - 23 mg/dL   Creatinine, Ser 0.58 (L) 0.61 - 1.24 mg/dL   Calcium 8.3 (L) 8.9 - 10.3 mg/dL   GFR, Estimated >60 >60 mL/min    Comment: (NOTE) Calculated using the CKD-EPI Creatinine Equation (2021)    Anion gap 4 (L) 5 - 15    Comment: Performed at [facility], [place] [address], [place], [place] [address]  Magnesium     Status: None   Collection Time: 09/17/21  4:26 AM  Result Value Ref Range   Magnesium 2.0 1.7 - 2.4 mg/dL    Comment: Performed at [facility], [place] [address] [place], [place] [address]  Phosphorus     Status: None   Collection Time: 09/17/21  4:26 AM  Result Value Ref Range   Phosphorus 3.4 2.5 - 4.6 mg/dL    Comment: Performed at [facility], [place] [address] [place], [place] [address]  Glucose, capillary     Status: Abnormal   Collection Time: 09/17/21  4:38 AM  Result Value Ref Range   Glucose-Capillary 115 (H) 70 - 99 mg/dL    Comment: Glucose reference range applies only to samples taken after fasting for at least 8 hours.    Studies/Results: No results found.  Medications: I have reviewed the patient's current medications.  Assessment: Resolving acute severe  pancreatitis, pancreatic necrosis, pancreatic fluid collections  Plan: Will try full liquid diet today Patient is on as needed antiemetics Advised to mobilize, get out of bed to chair, walk around in the hallways. Until patient is able to tolerate diet consistently, full TPN support recommended.  Ronnette Juniper, MD 09/17/2021, 10:19 AM

## 2021-09-17 NOTE — Progress Notes (Signed)
PROGRESS NOTE  Hunter Ward  DOB: 1951/06/07  PCP: Gaynelle Arabian, MD GUY:[id number]  DOA: 08/24/2021  LOS: 23 days  Hospital Day: 26   Chief Complaint  Patient presents with   Fever   Emesis   Abdominal Pain    Brief narrative: Hunter Ward is a 70 y.o. male with PMH significant for HTN, deaf mutism. Patient presented to the ED on 9/15 with complaint of worsening abdominal pain, nausea, vomiting. He was found to have evidence of progressive necrotizing pancreatitis. Admitted to hospitalist service GI consultation was obtained. Attempt for enteral nutrition failed.  TPN was started See below for details  Subjective: Patient was seen and examined this morning.  Dozing off in chair.  Not in distress. Communicating by writing.  No vomiting today but had 2 episodes yesterday.  Small formed stool.  Continues to have abdominal pain 5 out of 10.  Assessment/Plan: Severe necrotizing pancreatitis -Patient remains hospitalized with progressive necrotizing pancreatitis which is complicated by suspected abscess.  Currently on IV Rocephin.  GI and IR following.  IR tried drain placement however it was not successful. -On 10/4, CT abdomen was repeated which showed an interval improvement in the appearance of acute pancreatitis.  An EGD was tried to advance NJ tube to leave the tip in the jejunum/postpyloric area but it failed.   -Currently patient is getting TPN through PICC line. -Severe nature of necrotizing pancreatitis seems to be resolving at this time.  Noted a plan from GI to advance diet to full liquid today.   -Electrolyte levels stable Recent Labs  Lab 09/11/21 0324 09/13/21 0401 09/14/21 0407 09/15/21 0435 09/17/21 0426  K 3.8 3.5 3.5 3.7 4.3  MG 1.9  --  1.8 2.0 2.0  PHOS 3.3  --  3.1  --  3.4    Third spacing of fluid -Related to pancreatitis.  Per CT scan from 10/4, patient has a large volume of free fluid in the pelvis, bilateral pleural effusion left greater  than right. -Hemodynamically stable at this time.  Cholelithiasis, CBD dilatation  -CT abdomen from 10/4 showed gallstones and also persistent fusiform dilatation of the common bile duct with mild intrahepatic bile duct dilatation. Beak like narrowing of the mid and distal CBD noted at the level of the head of pancreas secondary to pancreatic edema. -No signs of choledocholithiasis. -Continue to monitor liver enzymes. 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  No anticoagulation in the setting of necrotizing pancreatitis for the risk of transformation to hemorrhagic pancreatitis.  Essential hypertension -On nifedipine, lisinopril as an outpatient -Blood pressure medicines currently on hold  Deaf mutism -Supportive care.  ASL interpreter via iPad for communication.  Chronic anemia -Mild chronic anemia.  Currently stable above 10.  No evidence of bleeding. Recent Labs    08/27/21 0511 08/28/21 0425 08/29/21 0326 09/05/21 0339 09/13/21 0401  HGB 10.3* 10.4* 10.2* 10.2* 10.1*  MCV 98.1 98.5 98.1 97.8 99.1    Constipation -As needed Dulcolax.  Mobility: Encourage ambulation Code Status:   Code Status: Full Code  Nutritional status: Body mass index is 21.32 kg/m. Nutrition Problem: Severe Malnutrition Etiology: acute illness (pancreatitis) Signs/Symptoms: severe fat depletion, severe muscle depletion, energy intake < or equal to 50% for > or equal to 5 days, percent weight loss Percent  weight loss: 17 % Diet:  Diet Order             Diet full liquid Room service appropriate? Yes; Fluid consistency: Thin  Diet effective now                  DVT prophylaxis:  enoxaparin (LOVENOX) injection 40 mg Start: 09/12/21 1000   Antimicrobials: Completed a course of IV Zosyn Fluid: TPN Consultants: GI Family  Communication: Family not at bedside  Status is: Inpatient  Remains inpatient appropriate because: Continues to require TPN for necrotizing pancreatitis Dispo: The patient is from: Home              Anticipated d/c is to: Home in next several days              Patient currently is not medically stable to d/c.   Difficult to place patient No     Infusions:   TPN ADULT (ION) 90 mL/hr at 09/16/21 1744   TPN ADULT (ION)      Scheduled Meds:  Chlorhexidine Gluconate Cloth  6 each Topical Daily   enoxaparin (LOVENOX) injection  40 mg Subcutaneous Daily   feeding supplement  1 Container Oral TID BM   pantoprazole (PROTONIX) IV  40 mg Intravenous Q24H   polyethylene glycol  17 g Oral Daily    Antimicrobials: Anti-infectives (From admission, onward)    Start     Dose/Rate Route Frequency Ordered Stop   08/25/21 0200  piperacillin-tazobactam (ZOSYN) IVPB 3.375 g        3.375 g 12.5 mL/hr over 240 Minutes Intravenous Every 8 hours 08/24/21 2009 09/08/21 0854   08/24/21 2200  piperacillin-tazobactam (ZOSYN) IVPB 3.375 g  Status:  Discontinued        3.375 g 100 mL/hr over 30 Minutes Intravenous Every 8 hours 08/24/21 1931 08/24/21 2009   08/24/21 1915  piperacillin-tazobactam (ZOSYN) IVPB 3.375 g        3.375 g 100 mL/hr over 30 Minutes Intravenous  Once 08/24/21 1911 08/24/21 2030       PRN meds: acetaminophen **OR** acetaminophen, bisacodyl, HYDROmorphone (DILAUDID) injection, lidocaine, ondansetron **OR** ondansetron (ZOFRAN) IV, phenol, prochlorperazine, sodium chloride flush   Objective: Vitals:   09/17/21 0439 09/17/21 1236  BP: (!) 105/54 112/75  Pulse: 67 66  Resp: 16 16  Temp: 98 F (36.7 C) 98.4 F (36.9 C)  SpO2: 97% 98%    Intake/Output Summary (Last 24 hours) at 09/17/2021 1402 Last data filed at 09/17/2021 1105 Gross per 24 hour  Intake 2732.3 ml  Output 2150 ml  Net 582.3 ml    Filed Weights   08/28/21 0626 09/04/21 1209 09/10/21 1521  Weight: 77.2  kg 67.1 kg 67.4 kg   Weight change:  Body mass index is 21.32 kg/m.   Physical Exam: General exam: Pleasant, elderly Caucasian male.  Not in distress Skin: No rashes, lesions or ulcers. HEENT: Atraumatic, normocephalic, no obvious bleeding Lungs: Clear to auscultation bilaterally CVS: Regular rate and rhythm, no murmur GI/Abd soft, moderate diffuse tenderness in lower abdomen, nondistended, bowel sound present CNS: Opens eyes on verbal command, oriented x3.  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  Recent Labs  Lab 09/13/21 0401  WBC 4.6  HGB 10.1*  HCT 31.7*  MCV 99.1  PLT 130*    Recent Labs  Lab 09/11/21 0324 09/13/21 0401 09/14/21 0407 09/15/21  0435 09/17/21 0426  NA 138 139 139 140 138  K 3.8 3.5 3.5 3.7 4.3  CL 105 105 105 109 104  CO2 30 28 32 30 30  GLUCOSE 103* 111* 128* 106* 118*  BUN 20 17 18 17 20   CREATININE 0.59* 0.51* 0.61 0.68 0.58*  CALCIUM 8.6* 8.7* 8.4* 8.6* 8.3*  MG 1.9  --  1.8 2.0 2.0  PHOS 3.3  --  3.1  --  3.4     F/u labs ordered Unresulted Labs (From admission, onward)     Start     Ordered   08/28/21 0500  Comprehensive metabolic panel  (TPN Lab Panel)  Every Mon,Thu (0500),   R      08/27/21 1520   08/28/21 0500  Magnesium  (TPN Lab Panel)  Every Mon,Thu (0500),   R      08/27/21 1520   08/28/21 0500  Phosphorus  (TPN Lab Panel)  Every Mon,Thu (0500),   R      08/27/21 1520   08/28/21 0500  Triglycerides  (TPN Lab Panel)  Every Monday (0500),   R      08/27/21 1520            Signed, Terrilee Croak, MD [facility] 09/17/2021

## 2021-09-17 NOTE — Progress Notes (Signed)
PHARMACY - TOTAL PARENTERAL NUTRITION CONSULT NOTE   Indication:  NPO for necrotizing pancreatitis  Patient Measurements: Height: 5' 10"  (177.8 cm) Weight: 67.4 kg (148 lb 9.4 oz) IBW/kg (Calculated) : 73 TPN AdjBW (KG): 71.9 Body mass index is 21.32 kg/m. Usual Weight: 80 kg  Assessment: 65 yoM re-admitted for necrotizing pancreatitis and is NPO.  Initially planning for post-pyloric tube feeds, but unable to advance NGT d/t significant duodenal stricture from pancreatic abscess and surrounding edema. Pharmacy consulted to manage TPN. Significant Event: 9/26: TPN was disconnected for contrast administration CT last PM (only 1 site for IV access) and unable to restart due to infection control policy.  Currently has D10 running at 80 ml/hr infusing.  Therefore, AM labs do not reflect electrolyte from TPN formula yesterday.  NGT came out.  Glucose / Insulin: No hx DM. CBG goal <180. SSI stopped 9/29 as they have been well-controlled Electrolytes: 10/9 Lytes are WNL Renal: SCr, BUN, bicarb stable & WNL Hepatic: LFTs, T.bili, Alk Phos WNL (10/6) TG WNL (10/2) I/O: Strict I/O ordered.  - UOP adequate, LBM 10/9 GI Imaging: - 9/15 CTa/p: progressive necrotizing pancreatitis w/ 8 cm suspected abscess at pancreatic head, multiple other loculated collections along pancreatic body; common hepatic duct dilation w/ persistent gallstones in GB. - 9/17 UGI: minimal passage of contrast past duodenal stricture, likely d/t mass effect from pancreatic abscess/edema -9/25: CT a/p necrotizing pancreatitis, relatively stable pancreatic/peripancreatic necrotic collection - 9/30 AbXR: Weighted tip feeding tube now either in the gastric pylorus or first portion of the duodenum - 10/4 CT: interval improvement in acute pancreatitis GI Surgeries / Procedures:  - 8/29: Pancreatic stent placed - 9/16: NG tube placed - 9/23: NGT placed at proximal descending duodenum by IR - 9/26: NGT came out - 9/27: Dobhoff tube  placement by IR - 10/4 EGD: removed pancreatic stent, removed feeding tube  Central access: Triple lumen PICC 9/19 TPN start date: 9/19  Nutritional Goals:  per RD Assessment (09/12/21):  Kcal: 2100-2300  Protein: 110-125 g Fluid: > 2 L/day  Current TPN formulation at goal rate of 90 mL/hr provides 114 g protein, 2163 kcals   Current Nutrition:  Clear liquids, RD added boost breeze on 10/4 (minimal intake charted), TPN  Plan:  At 1800:  Continue TPN at  goal rate of 90 mL/hr   Electrolytes in TPN:  Na - 125 mEq/L  K - 35 mEq/L Ca - 3 mEq/L Mg - 8 mEq/L Phos - 15 mmol/L Cl:Ac ratio 1:2 Continue standard MVI and trace elements to TPN Continue no CBG check since they have been well-controlled, if AM glucose rises, will re-institute  No current MIVF; orders per MD if needed Monitor TPN labs on Mon/Thurs Follow up ability to tolerate diet  Napoleon Form  09/17/2021 10:56 AM

## 2021-09-17 NOTE — Plan of Care (Signed)

## 2021-09-18 DIAGNOSIS — K8591 Acute pancreatitis with uninfected necrosis, unspecified: Secondary | ICD-10-CM | POA: Diagnosis not present

## 2021-09-18 LAB — GLUCOSE, CAPILLARY
Glucose-Capillary: 104 mg/dL — ABNORMAL HIGH (ref 70–99)
Glucose-Capillary: 94 mg/dL (ref 70–99)

## 2021-09-18 LAB — COMPREHENSIVE METABOLIC PANEL
ALT: 72 U/L — ABNORMAL HIGH (ref 0–44)
AST: 57 U/L — ABNORMAL HIGH (ref 15–41)
Albumin: 2.6 g/dL — ABNORMAL LOW (ref 3.5–5.0)
Alkaline Phosphatase: 90 U/L (ref 38–126)
Anion gap: 3 — ABNORMAL LOW (ref 5–15)
BUN: 19 mg/dL (ref 8–23)
CO2: 30 mmol/L (ref 22–32)
Calcium: 8.4 mg/dL — ABNORMAL LOW (ref 8.9–10.3)
Chloride: 103 mmol/L (ref 98–111)
Creatinine, Ser: 0.58 mg/dL — ABNORMAL LOW (ref 0.61–1.24)
GFR, Estimated: >60 mL/min (ref >60)
Glucose, Bld: 108 mg/dL — ABNORMAL HIGH (ref 70–99)
Potassium: 4.1 mmol/L (ref 3.5–5.1)
Sodium: 136 mmol/L (ref 135–145)
Total Bilirubin: 0.7 mg/dL (ref 0.3–1.2)
Total Protein: 6.1 g/dL — ABNORMAL LOW (ref 6.5–8.1)

## 2021-09-18 LAB — TRIGLYCERIDES: Triglycerides: 27 mg/dL (ref <150)

## 2021-09-18 LAB — MAGNESIUM: Magnesium: 2 mg/dL (ref 1.7–2.4)

## 2021-09-18 LAB — PHOSPHORUS: Phosphorus: 3.7 mg/dL (ref 2.5–4.6)

## 2021-09-18 MED ORDER — TRAVASOL 10 % IV SOLN
INTRAVENOUS | Status: AC
  Filled 2021-09-18: qty 1197

## 2021-09-18 NOTE — Progress Notes (Signed)
Subjective: ALS interpreter used for sign language interpretation. No episodes of vomiting today. He is on full liquid diet.  He reports having several bowel movements, 5 yesterday per patient, small but more formed stool, some liquid. Patient complains of lower AB pain, worse with bending/sitting upright.  No Urinary issues. No fever, chills.  He is up and walking with walker with help.    Objective: Vital signs in last 24 hours: Temp:  [98 F (36.7 C)-98.1 F (36.7 C)] 98 F (36.7 C) (10/10 0521) Pulse Rate:  [61-62] 61 (10/10 0521) Resp:  [15-20] 15 (10/10 0521) BP: (89-114)/(56-65) 89/56 (10/10 0521) SpO2:  [97 %-98 %] 97 % (10/10 0521) Weight change:  Last BM Date: 09/18/21  PE: Appears comfortable, mild pallor, no icterus GENERAL: Not in distress  ABDOMEN: Soft, tenderness appreciated in suprapubic and lower abdominal area, hypoactive bowel sounds. Non distended, soft  EXTREMITIES: No deformity, receiving IV TPN  Lab Results: Results for orders placed or performed during the hospital encounter of 08/24/21 (from the past 48 hour(s))  Basic metabolic panel     Status: Abnormal   Collection Time: 09/17/21  4:26 AM  Result Value Ref Range   Sodium 138 135 - 145 mmol/L   Potassium 4.3 3.5 - 5.1 mmol/L   Chloride 104 98 - 111 mmol/L   CO2 30 22 - 32 mmol/L   Glucose, Bld 118 (H) 70 - 99 mg/dL    Comment: Glucose reference range applies only to samples taken after fasting for at least 8 hours.   BUN 20 8 - 23 mg/dL   Creatinine, Ser 0.58 (L) 0.61 - 1.24 mg/dL   Calcium 8.3 (L) 8.9 - 10.3 mg/dL   GFR, Estimated >60 >60 mL/min    Comment: (NOTE) Calculated using the CKD-EPI Creatinine Equation (2021)    Anion gap 4 (L) 5 - 15    Comment: Performed at [facility], [place] [address] [place], [place] [address]  Magnesium     Status: None   Collection Time: 09/17/21  4:26 AM  Result Value Ref Range   Magnesium 2.0 1.7 - 2.4 mg/dL    Comment: Performed at  [facility], [place] [address] [place], [place] [address]  Phosphorus     Status: None   Collection Time: 09/17/21  4:26 AM  Result Value Ref Range   Phosphorus 3.4 2.5 - 4.6 mg/dL    Comment: Performed at [facility], [place] [address], [place], [place] [address]  Glucose, capillary     Status: Abnormal   Collection Time: 09/17/21  4:38 AM  Result Value Ref Range   Glucose-Capillary 115 (H) 70 - 99 mg/dL    Comment: Glucose reference range applies only to samples taken after fasting for at least 8 hours.  Comprehensive metabolic panel     Status: Abnormal   Collection Time: 09/18/21  3:50 AM  Result Value Ref Range   Sodium 136 135 - 145 mmol/L   Potassium 4.1 3.5 - 5.1 mmol/L   Chloride 103 98 - 111 mmol/L   CO2 30 22 - 32 mmol/L   Glucose, Bld 108 (H) 70 - 99 mg/dL    Comment: Glucose reference range applies only to samples taken after fasting for at least 8 hours.   BUN 19 8 - 23 mg/dL   Creatinine, Ser 0.58 (L) 0.61 - 1.24 mg/dL   Calcium 8.4 (L) 8.9 - 10.3 mg/dL   Total Protein 6.1 (L) 6.5 - 8.1 g/dL  Albumin 2.6 (L) 3.5 - 5.0 g/dL   AST 57 (H) 15 - 41 U/L   ALT 72 (H) 0 - 44 U/L   Alkaline Phosphatase 90 38 - 126 U/L   Total Bilirubin 0.7 0.3 - 1.2 mg/dL   GFR, Estimated >60 >60 mL/min    Comment: (NOTE) Calculated using the CKD-EPI Creatinine Equation (2021)    Anion gap 3 (L) 5 - 15    Comment: Performed at [facility], [place] [address], [place], [place] [address]  Magnesium     Status: None   Collection Time: 09/18/21  3:50 AM  Result Value Ref Range   Magnesium 2.0 1.7 - 2.4 mg/dL    Comment: Performed at [facility], [place] [address], [place], [place] [address]  Phosphorus     Status: None   Collection Time: 09/18/21  3:50 AM  Result Value Ref Range   Phosphorus 3.7 2.5 - 4.6 mg/dL    Comment: Performed at [facility], [place] [address], [place], [place] [address]   Triglycerides     Status: None   Collection Time: 09/18/21  3:50 AM  Result Value Ref Range   Triglycerides 27 <150 mg/dL    Comment: Performed at [facility], [place] [address], [place], [place] [address]  Glucose, capillary     Status: Abnormal   Collection Time: 09/18/21 12:28 PM  Result Value Ref Range   Glucose-Capillary 104 (H) 70 - 99 mg/dL    Comment: Glucose reference range applies only to samples taken after fasting for at least 8 hours.    Studies/Results: No results found.  Medications: I have reviewed the patient's current medications.  Assessment: Resolving acute severe pancreatitis, pancreatic necrosis, pancreatic fluid collections CT 09/12/2021- Large volume of free fluid is noted within the pelvis without definitive signs of loculation Lower AB pain- no urinary issues, no fever, chills. Last CBC 10/05 WBC 4.6 HGB 10.1 MCV 99.1 Platelets 130 LFTs stable, increased slightly.  AST 57 ALT 72 Alkphos 90 TBili 0.7  Plan: Continue full liquid diet today Patient is on as needed antiemetics Continue to mobilize, get out of bed to chair, walk around in the hallways. Until patient is able to tolerate diet consistently, full TPN support recommended. Can consider AB [place] or repeat CT scan for lower AB pain if continues- last CT on 09/12/2021 showed large volume free fluid within pelvis without signs of loculation.   Vladimir Crofts, PA-C 09/18/2021, 12:57 PM

## 2021-09-18 NOTE — Progress Notes (Signed)
   09/18/21 1122  Mobility  Activity Contraindicated/medical hold   Pt BP lower than MS parameters. Hold mobility for today.    [place] Specialist Acute Rehab Services Office: [phone]

## 2021-09-18 NOTE — Progress Notes (Signed)
PROGRESS NOTE  Hunter Ward  DOB: February 20, 1951  PCP: Gaynelle Arabian, MD HOZ:[id number]  DOA: 08/24/2021  LOS: 24 days  Hospital Day: 26   Chief Complaint  Patient presents with   Fever   Emesis   Abdominal Pain    Brief narrative: Hunter Ward is a 70 y.o. male with PMH significant for HTN, deaf mutism. Patient presented to the ED on 9/15 with complaint of worsening abdominal pain, nausea, vomiting. He was found to have evidence of progressive necrotizing pancreatitis. Admitted to hospitalist service GI consultation was obtained. Attempt for enteral nutrition failed.  TPN was started See below for details  Subjective: Patient was seen and examined this morning.  Not in distress. No vomiting today but continues to have nausea.  On full liquid diet. He had small formed stools.  Assessment/Plan: Severe necrotizing pancreatitis -Patient remains hospitalized with progressive necrotizing pancreatitis which is complicated by suspected abscess.  Currently on IV Rocephin.  GI and IR following.  IR tried drain placement however it was not successful. -On 10/4, CT abdomen was repeated which showed an interval improvement in the appearance of acute pancreatitis.  An EGD was tried to advance NJ tube to leave the tip in the jejunum/postpyloric area but it failed.   -Currently patient is getting TPN through PICC line. -Severe nature of necrotizing pancreatitis seems to be resolving at this time.  Currently on full liquid diet per GI.  Continue to encourage ambulation. -Electrolyte levels stable Recent Labs  Lab 09/13/21 0401 09/14/21 0407 09/15/21 0435 09/17/21 0426 09/18/21 0350  K 3.5 3.5 3.7 4.3 4.1  MG  --  1.8 2.0 2.0 2.0  PHOS  --  3.1  --  3.4 3.7    Third spacing of fluid -Related to pancreatitis.  Per CT scan from 10/4, patient has a large volume of free fluid in the pelvis, bilateral pleural effusion left greater than right. -Hemodynamically stable at this  time.  Cholelithiasis, CBD dilatation  -CT abdomen from 10/4 showed gallstones and also persistent fusiform dilatation of the common bile duct with mild intrahepatic bile duct dilatation. Beak like narrowing of the mid and distal CBD noted at the level of the head of pancreas secondary to pancreatic edema. -No signs of choledocholithiasis.  -Liver enzymes trend as below.  AST and ALT are gradually trending up but alkaline phosphatase and bilirubin level remain normal. 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  No anticoagulation in the setting of necrotizing pancreatitis for the risk of transformation to hemorrhagic pancreatitis.  Essential hypertension -On nifedipine, lisinopril as an outpatient -Blood pressure medicines currently on hold  Deaf mutism -Supportive care.  ASL interpreter via iPad for communication.  Chronic anemia -Mild chronic anemia.  Currently stable above 10.  No evidence of bleeding. Recent Labs    08/27/21 0511 08/28/21 0425 08/29/21 0326 09/05/21 0339 09/13/21 0401  HGB 10.3* 10.4* 10.2* 10.2* 10.1*  MCV 98.1 98.5 98.1 97.8 99.1    Constipation -As needed Dulcolax.  Mobility: Encourage ambulation Code Status:   Code Status: Full Code  Nutritional status: Body mass index is 21.32 kg/m. Nutrition Problem: Severe Malnutrition Etiology: acute illness (pancreatitis) Signs/Symptoms: severe fat depletion, severe muscle depletion, energy intake < or equal to 50% for > or equal to 5 days, percent weight loss  Percent weight loss: 17 % Diet:  Diet Order             Diet full liquid Room service appropriate? Yes; Fluid consistency: Thin  Diet effective now                  DVT prophylaxis:  enoxaparin (LOVENOX) injection 40 mg Start: 09/12/21 1000   Antimicrobials: Completed a course of IV  Zosyn Fluid: TPN Consultants: GI Family Communication: Family not at bedside  Status is: Inpatient  Remains inpatient appropriate because: Continues to require TPN for necrotizing pancreatitis Dispo: The patient is from: Home              Anticipated d/c is to: Home in next several days              Patient currently is not medically stable to d/c.   Difficult to place patient No     Infusions:   TPN ADULT (ION) 90 mL/hr at 09/17/21 1754    Scheduled Meds:  Chlorhexidine Gluconate Cloth  6 each Topical Daily   enoxaparin (LOVENOX) injection  40 mg Subcutaneous Daily   feeding supplement  1 Container Oral TID BM   pantoprazole (PROTONIX) IV  40 mg Intravenous Q24H   polyethylene glycol  17 g Oral Daily    Antimicrobials: Anti-infectives (From admission, onward)    Start     Dose/Rate Route Frequency Ordered Stop   08/25/21 0200  piperacillin-tazobactam (ZOSYN) IVPB 3.375 g        3.375 g 12.5 mL/hr over 240 Minutes Intravenous Every 8 hours 08/24/21 2009 09/08/21 0854   08/24/21 2200  piperacillin-tazobactam (ZOSYN) IVPB 3.375 g  Status:  Discontinued        3.375 g 100 mL/hr over 30 Minutes Intravenous Every 8 hours 08/24/21 1931 08/24/21 2009   08/24/21 1915  piperacillin-tazobactam (ZOSYN) IVPB 3.375 g        3.375 g 100 mL/hr over 30 Minutes Intravenous  Once 08/24/21 1911 08/24/21 2030       PRN meds: acetaminophen **OR** acetaminophen, bisacodyl, HYDROmorphone (DILAUDID) injection, lidocaine, ondansetron **OR** ondansetron (ZOFRAN) IV, phenol, prochlorperazine, sodium chloride flush   Objective: Vitals:   09/17/21 2118 09/18/21 0521  BP: 114/65 (!) 89/56  Pulse: 62 61  Resp: 20 15  Temp: 98.1 F (36.7 C) 98 F (36.7 C)  SpO2: 98% 97%    Intake/Output Summary (Last 24 hours) at 09/18/2021 1000 Last data filed at 09/18/2021 0814 Gross per 24 hour  Intake 1973.16 ml  Output 1950 ml  Net 23.16 ml    Filed Weights   08/28/21 0626 09/04/21 1209  09/10/21 1521  Weight: 77.2 kg 67.1 kg 67.4 kg   Weight change:  Body mass index is 21.32 kg/m.   Physical Exam: General exam: Pleasant, elderly Caucasian male.  Not in physical distress Skin: No rashes, lesions or ulcers. HEENT: Atraumatic, normocephalic, no obvious bleeding Lungs: Clear to auscultation bilaterally CVS: Regular rate and rhythm, no murmur GI/Abd soft, moderate diffuse tenderness in lower abdomen, nondistended, bowel sound present CNS: Alert, awake, at baseline has deaf-mutism Psychiatry: Frustrated with prolonged hospital stay Extremities: No pedal edema, no calf tenderness  Data Review: I have personally reviewed the laboratory data and studies available.  Recent Labs  Lab 09/13/21 0401  WBC 4.6  HGB 10.1*  HCT 31.7*  MCV 99.1  PLT 130*    Recent Labs  Lab 09/13/21 0401 09/14/21 0407 09/15/21 0435 09/17/21 0426 09/18/21 0350  NA 139 139 140  138 136  K 3.5 3.5 3.7 4.3 4.1  CL 105 105 109 104 103  CO2 28 32 30 30 30   GLUCOSE 111* 128* 106* 118* 108*  BUN 17 18 17 20 19   CREATININE 0.51* 0.61 0.68 0.58* 0.58*  CALCIUM 8.7* 8.4* 8.6* 8.3* 8.4*  MG  --  1.8 2.0 2.0 2.0  PHOS  --  3.1  --  3.4 3.7     F/u labs ordered Unresulted Labs (From admission, onward)     Start     Ordered   08/28/21 0500  Comprehensive metabolic panel  (TPN Lab Panel)  Every Mon,Thu (0500),   R      08/27/21 1520   08/28/21 0500  Magnesium  (TPN Lab Panel)  Every Mon,Thu (0500),   R      08/27/21 1520   08/28/21 0500  Phosphorus  (TPN Lab Panel)  Every Mon,Thu (0500),   R      08/27/21 1520   08/28/21 0500  Triglycerides  (TPN Lab Panel)  Every Monday (0500),   R      08/27/21 1520            Signed, Terrilee Croak, MD [facility] 09/18/2021

## 2021-09-18 NOTE — Progress Notes (Addendum)
Pt had 3 small type 7 stool occurrences between 0700 and 1800.  Angie Fava, RN

## 2021-09-18 NOTE — Progress Notes (Signed)
PHARMACY - TOTAL PARENTERAL NUTRITION CONSULT NOTE   Indication:  NPO for necrotizing pancreatitis  Patient Measurements: Height: 5' 10" (177.8 cm) Weight: 67.4 kg (148 lb 9.4 oz) IBW/kg (Calculated) : 73 TPN AdjBW (KG): 71.9 Body mass index is 21.32 kg/m. Usual Weight: 80 kg  Assessment: 70 yoM re-admitted for necrotizing pancreatitis and is NPO.  Initially planning for post-pyloric tube feeds, but unable to advance NGT d/t significant duodenal stricture from pancreatic abscess and surrounding edema. Pharmacy consulted to manage TPN.  Glucose / Insulin: No hx DM. CBG goal <180. SSI stopped 9/29 as they have been well-controlled Electrolytes:  Lytes are WNL Renal: SCr, BUN, bicarb stable & WNL Hepatic: AST/ALT previously low/normal, now increased to 57/72.  T.bili, Alk Phos WNL (10/10) TG WNL (10/10) I/O: Strict I/O ordered.  - UOP adequate, LBM 10/8 (small formed stool per MD), Emesis x2 yesterday per MD GI Imaging: - 9/15 CTa/p: progressive necrotizing pancreatitis w/ 8 cm suspected abscess at pancreatic head, multiple other loculated collections along pancreatic body; common hepatic duct dilation w/ persistent gallstones in GB. - 9/17 UGI: minimal passage of contrast past duodenal stricture, likely d/t mass effect from pancreatic abscess/edema -9/25: CT a/p necrotizing pancreatitis, relatively stable pancreatic/peripancreatic necrotic collection - 9/30 AbXR: Weighted tip feeding tube now either in the gastric pylorus or first portion of the duodenum - 10/4 CT: interval improvement in acute pancreatitis GI Surgeries / Procedures:  - 8/29: Pancreatic stent placed - 9/16: NG tube placed - 9/23: NGT placed at proximal descending duodenum by IR - 9/26: NGT came out - 9/27: Dobhoff tube placement by IR - 10/4 EGD: removed pancreatic stent, removed feeding tube  Central access: Triple lumen PICC 9/19 TPN start date: 9/19  Nutritional Goals:  per RD Assessment (09/12/21):  Kcal:  2100-2300  Protein: 110-125 g Fluid: > 2 L/day  Continuous TPN formulation at goal rate of 90 mL/hr provides 114 g protein, 2163 kcals  Cyclic TPN formula:  2100 mL provides 120g protein, 2125 total Kcal, and GIR will be 4.91 while at max rate during 18 hour infusion.   Current Nutrition:  Full liquids and TPN, RD added boost breeze daily on 10/4 10/9 Intake:  50-90% of meals  Plan:  At 1800:  Change to cyclic TPN, 2100 mL over 18 hours.   Electrolytes in TPN:  Na - 125 mEq/L  K - 35 mEq/L Ca - 3 mEq/L Mg - 8 mEq/L Phos - 15 mmol/L Cl:Ac ratio 1:1 Continue standard MVI and trace elements to TPN Continue no CBG checks; restart CBGs if AM glucose rises No current MIVF; orders per MD if needed Monitor TPN labs on Mon/Thurs.  BMET 10/11. Follow up ability to tolerate enteral diet   Christine Shade PharmD, BCPS Clinical Pharmacist [facility] main pharmacy [phone] 09/18/2021 8:00 AM    

## 2021-09-19 ENCOUNTER — Inpatient Hospital Stay (HOSPITAL_COMMUNITY): Payer: Medicare Other

## 2021-09-19 DIAGNOSIS — K8591 Acute pancreatitis with uninfected necrosis, unspecified: Secondary | ICD-10-CM | POA: Diagnosis not present

## 2021-09-19 LAB — BASIC METABOLIC PANEL
Anion gap: 5 (ref 5–15)
BUN: 21 mg/dL (ref 8–23)
CO2: 30 mmol/L (ref 22–32)
Calcium: 8.4 mg/dL — ABNORMAL LOW (ref 8.9–10.3)
Chloride: 102 mmol/L (ref 98–111)
Creatinine, Ser: 0.62 mg/dL (ref 0.61–1.24)
GFR, Estimated: >60 mL/min (ref >60)
Glucose, Bld: 114 mg/dL — ABNORMAL HIGH (ref 70–99)
Potassium: 4.4 mmol/L (ref 3.5–5.1)
Sodium: 137 mmol/L (ref 135–145)

## 2021-09-19 LAB — GLUCOSE, CAPILLARY
Glucose-Capillary: 108 mg/dL — ABNORMAL HIGH (ref 70–99)
Glucose-Capillary: 111 mg/dL — ABNORMAL HIGH (ref 70–99)
Glucose-Capillary: 117 mg/dL — ABNORMAL HIGH (ref 70–99)
Glucose-Capillary: 117 mg/dL — ABNORMAL HIGH (ref 70–99)
Glucose-Capillary: 127 mg/dL — ABNORMAL HIGH (ref 70–99)

## 2021-09-19 MED ORDER — TRAVASOL 10 % IV SOLN
INTRAVENOUS | Status: AC
  Filled 2021-09-19: qty 1197

## 2021-09-19 MED ORDER — LIDOCAINE HCL 1 % IJ SOLN
INTRAMUSCULAR | Status: AC
  Filled 2021-09-19: qty 20

## 2021-09-19 NOTE — Progress Notes (Signed)
Subjective: ALS interpreter used for sign language interpretation. No episodes of vomiting today but he does not have much of an appetite, feels full and uncomfortable with eating. He is on full liquid diet.  He reports having several bowel movements, 8 yesterday per patient, very small liquids stools.  Patient complains of lower AB pain, feels more distended today, worse with bending/sitting upright.  No Urinary issues. No fever, chills.  He is up and walking with walker with help.    Objective: Vital signs in last 24 hours: Temp:  [98 F (36.7 C)-98.6 F (37 C)] 98 F (36.7 C) (10/11 0559) Pulse Rate:  [60-69] 65 (10/11 0559) Resp:  [16-20] 16 (10/11 0559) BP: (98-108)/(61-73) 108/73 (10/11 0559) SpO2:  [98 %-99 %] 98 % (10/11 0559) Weight change:  Last BM Date: 09/18/21  PE: Appears comfortable, mild pallor, no icterus GENERAL: Not in distress  ABDOMEN: Distended suprapubic area with dullness and tenderness appreciated in suprapubic and lower abdominal area, hypoactive bowel sounds. EXTREMITIES: No deformity, receiving IV TPN  Lab Results: Results for orders placed or performed during the hospital encounter of 08/24/21 (from the past 48 hour(s))  Comprehensive metabolic panel     Status: Abnormal   Collection Time: 09/18/21  3:50 AM  Result Value Ref Range   Sodium 136 135 - 145 mmol/L   Potassium 4.1 3.5 - 5.1 mmol/L   Chloride 103 98 - 111 mmol/L   CO2 30 22 - 32 mmol/L   Glucose, Bld 108 (H) 70 - 99 mg/dL    Comment: Glucose reference range applies only to samples taken after fasting for at least 8 hours.   BUN 19 8 - 23 mg/dL   Creatinine, Ser 0.58 (L) 0.61 - 1.24 mg/dL   Calcium 8.4 (L) 8.9 - 10.3 mg/dL   Total Protein 6.1 (L) 6.5 - 8.1 g/dL   Albumin 2.6 (L) 3.5 - 5.0 g/dL   AST 57 (H) 15 - 41 U/L   ALT 72 (H) 0 - 44 U/L   Alkaline Phosphatase 90 38 - 126 U/L   Total Bilirubin 0.7 0.3 - 1.2 mg/dL   GFR, Estimated >60 >60 mL/min    Comment: (NOTE) Calculated  using the CKD-EPI Creatinine Equation (2021)    Anion gap 3 (L) 5 - 15    Comment: Performed at [facility], [place] [address], [place], [place] [address]  Magnesium     Status: None   Collection Time: 09/18/21  3:50 AM  Result Value Ref Range   Magnesium 2.0 1.7 - 2.4 mg/dL    Comment: Performed at [facility], [place] [address] [place], [place] [address]  Phosphorus     Status: None   Collection Time: 09/18/21  3:50 AM  Result Value Ref Range   Phosphorus 3.7 2.5 - 4.6 mg/dL    Comment: Performed at [facility], [place] [address], [place],  [address]  Triglycerides     Status: None   Collection Time: 09/18/21  3:50 AM  Result Value Ref Range   Triglycerides 27 <150 mg/dL    Comment: Performed at [facility], [place] [address], [place], [place] [address]  Glucose, capillary     Status: Abnormal   Collection Time: 09/18/21 12:28 PM  Result Value Ref Range   Glucose-Capillary 104 (H) 70 - 99 mg/dL    Comment: Glucose reference range applies only to samples taken after fasting for at least 8 hours.  Glucose, capillary  Status: None   Collection Time: 09/18/21  6:12 PM  Result Value Ref Range   Glucose-Capillary 94 70 - 99 mg/dL    Comment: Glucose reference range applies only to samples taken after fasting for at least 8 hours.  Glucose, capillary     Status: Abnormal   Collection Time: 09/19/21 12:02 AM  Result Value Ref Range   Glucose-Capillary 108 (H) 70 - 99 mg/dL    Comment: Glucose reference range applies only to samples taken after fasting for at least 8 hours.  Basic metabolic panel     Status: Abnormal   Collection Time: 09/19/21  4:00 AM  Result Value Ref Range   Sodium 137 135 - 145 mmol/L   Potassium 4.4 3.5 - 5.1 mmol/L   Chloride 102 98 - 111 mmol/L   CO2 30 22 - 32 mmol/L   Glucose, Bld 114 (H) 70 - 99 mg/dL    Comment: Glucose reference range applies only to samples taken after  fasting for at least 8 hours.   BUN 21 8 - 23 mg/dL   Creatinine, Ser 0.62 0.61 - 1.24 mg/dL   Calcium 8.4 (L) 8.9 - 10.3 mg/dL   GFR, Estimated >60 >60 mL/min    Comment: (NOTE) Calculated using the CKD-EPI Creatinine Equation (2021)    Anion gap 5 5 - 15    Comment: Performed at [facility], [place] [address] [place], [place] [address]  Glucose, capillary     Status: Abnormal   Collection Time: 09/19/21  5:56 AM  Result Value Ref Range   Glucose-Capillary 117 (H) 70 - 99 mg/dL    Comment: Glucose reference range applies only to samples taken after fasting for at least 8 hours.    Studies/Results: No results found.  Medications: I have reviewed the patient's current medications.  Assessment: Resolving acute severe pancreatitis, pancreatic necrosis, pancreatic fluid collections Loose stools Lower AB pain- more distended today on exam.  CT 09/12/2021- Large volume of free fluid is noted within the pelvis without definitive signs of loculation  -no urinary issues, no fever, chills.   Plan: Continue full liquid diet today Patient is on as needed antiemetics Continue to mobilize, get out of bed to chair, walk around in the hallways. Until patient is able to tolerate diet consistently, full TPN support recommended. Will get AB and pelvis US with therapeutic paracentesis. Last CT on 09/12/2021 showed large volume free fluid within pelvis without signs of loculation.  No fever, chills.  Patient has been having more constipation, on miralax 17 gram once daily since 10/06- continue once a day, add on metamucil.   Vladimir Crofts, PA-C 09/19/2021, 9:49 AM

## 2021-09-19 NOTE — Progress Notes (Signed)
Patient ID: Hunter Ward, male   DOB: July 03, 1951, 70 y.o.   MRN: [id number] Patient presented to ultrasound department today for paracentesis.  On limited ultrasound of abdomen in all 4 quadrants there is no significant ascites present.  Procedure was canceled.  Patient updated.

## 2021-09-19 NOTE — Progress Notes (Signed)
Nutrition Follow-up  DOCUMENTATION CODES:   Severe malnutrition in context of acute illness/injury  INTERVENTION:  - continue TPN per Pharmacist.  - continue Boost Breeze TID. - diet advancement as medically feasible. - weigh patient today.   NUTRITION DIAGNOSIS:   Severe Malnutrition related to acute illness (pancreatitis) as evidenced by severe fat depletion, severe muscle depletion, energy intake < or equal to 50% for > or equal to 5 days, percent weight loss. -ongoing  GOAL:   Patient will meet greater than or equal to 90% of their needs -met with TPN and PO intakes  MONITOR:   PO intake, Supplement acceptance, Diet advancement  ASSESSMENT:   70 year old male with PMH pancreatitis, HTN, hx CCY (~30 yrs ago), deafness (uses ASL) who presented to the hospital with worsening abdomial pain and inability to keep any food down the past 3 days. His last meal kept down was on 08/22/21.  Significant Events:  PTA - 8/29- pancreatic stent placed  9/16 - admit for progressive necrotizing pancreatitis with 8 cm abscess of pancreatic head and multiple other collections, common hepatic duct dilations with persistent gallstones in GB, NG tube placed 9/17 - UGI, duodenal stricture due to mass effect from pancreatic abscess/edema 9/19 - triple lumen PICC placed in R basilic; TPN started 4/03 - NG tube placed at proximal descending duodenum by IR - unable to advance further  9/26 - NG tube out 9/27 - small bore feeding place in IR - unable to advance to LOT 9/30 - weighted feeding tip now in gastric pylorus or first portion of the duodenum; unable to advance further 10/3 - diet advanced to CLD 10/4 - EGD; removed pancreatic stent, removed feeding tube since it was not able to be advanced further  10/9 - diet advanced to FLD   Patient unavailable at attempt this AM and currently out of the room to ultrasound.   Documented intakes since diet advancement on 10/3 have been 5-75%. Intakes  have been better on FLD than on CLD.   Boost Breeze ordered TID on 10/4 and he has accepted all but 2 bottles since that time.   NGT remains out since removal on 10/3. Triple lumen PICC remains in place and he is receiving cyclic TPN F54 hours/day which is providing 2125 kcal and 120 grams protein/day.    He has not been weighed since 10/2. Non-pitting edema to BLE documented in the edema section of flow sheet. He is noted to be +11.08 L since admission.   GI note from today outlines that patient, via ASL interpreter, reported no vomiting today but appetite remains decreased and he feels full and uncomfortable even with minimal PO intakes. He reported 8 very small, liquidy BMs yesterday. He was feeling more distended today than yesterday.    Labs reviewed; CBGs: 108, 117, and 127 mg/dl, Ca: 8.4 mg/dl.  Medications reviewed; 40 mg IV protonix/day, 17 g miralax.   Diet Order:   Diet Order             Diet full liquid Room service appropriate? Yes; Fluid consistency: Thin  Diet effective now                   EDUCATION NEEDS:   No education needs have been identified at this time  Skin:  Skin Assessment: Reviewed RN Assessment  Last BM:  10/10 (type 7)  Height:   Ht Readings from Last 1 Encounters:  08/25/21 5' 10"  (1.778 m)    Weight:   Wt  Readings from Last 1 Encounters:  09/10/21 67.4 kg     Estimated Nutritional Needs:  Kcal:  2100-2300 Protein:  110-125 grams Fluid:  > 2 L/day      Hunter Matin, MS, RD, LDN, CNSC Inpatient Clinical Dietitian RD pager # available in [facility]  After hours/weekend pager # available in [facility]

## 2021-09-19 NOTE — Progress Notes (Signed)
Mobility Specialist - Progress Note    09/19/21 1556  Mobility  Activity Ambulated in hall  Level of [place] wheel walker  Distance Ambulated (ft) 400 ft  Mobility Ambulated independently in hallway  Mobility Response Tolerated well  Mobility performed by Mobility specialist  $Mobility charge 1 Mobility   Pt is very pleasant and enjoys ambulating in hall. Pt used RW to ambulate 400 ft in hallway and did not show signs of experiencing pain, dizziness, or SOB. After returning to room, pt completed a standing weigh in with NT. Pt was then left in bed with call bell at side.   [place] Specialist Acute Rehabilitation Services Phone: [phone] 09/19/21, 3:58 PM

## 2021-09-19 NOTE — Progress Notes (Signed)
Pt's PICC line alarmed "Occluded" several times during shift.  IV Team consulted, recommended at [place] channel if occlusion occurs again.  If occlusion occurs with new channel, contact IV team.  Angie Fava, RN

## 2021-09-19 NOTE — Progress Notes (Signed)
Patient's diet was advanced to soft from full liquid at patient's request.  Patient at 25% of meal including meatloaf and potatoes.  Tolerating well.  Patient complained of nausea and pain throughout the shift and received PRN antiemetics (Zofran and Compazine) and analgesics (Dilaudid) as available.  Angie Fava, RN

## 2021-09-19 NOTE — Progress Notes (Signed)
PROGRESS NOTE  Hunter Ward  DOB: [birth year]  PCP: Gaynelle Arabian, MD OZD:[id number]  DOA: 08/24/2021  LOS: 25 days  Hospital Day: 27   Chief Complaint  Patient presents with   Fever   Emesis   Abdominal Pain    Brief narrative: Hunter Ward is a 70 y.o. male with PMH significant for HTN, deaf mutism. Patient presented to the ED on 9/15 with complaint of worsening abdominal pain, nausea, vomiting. He was found to have evidence of progressive necrotizing pancreatitis. Admitted to hospitalist service GI consultation was obtained. Attempt for enteral nutrition failed.  TPN was started See below for details  Subjective: Patient was seen and examined this morning.  Not in distress. No vomiting.  Multiple small bowel movements.  On full liquid diet.  Feels a little uncomfortable after eating.  Remains on TPN.  Assessment/Plan: Severe necrotizing pancreatitis -Patient remains hospitalized with progressive necrotizing pancreatitis which is complicated by suspected abscess.  Currently on IV Rocephin.  GI and IR following.  IR tried drain placement however it was not successful. -On 10/4, CT abdomen was repeated which showed an interval improvement in the appearance of acute pancreatitis.  An EGD was tried to advance NJ tube to leave the tip in the jejunum/postpyloric area but it failed.   -Currently patient is getting TPN through PICC line. -Severe nature of necrotizing pancreatitis seems to be resolving at this time.  Currently on full liquid diet per GI.  Still not able to completely tolerate.  Encourage ambulation.  Electrolytes stable. Recent Labs  Lab 09/14/21 0407 09/15/21 0435 09/17/21 0426 09/18/21 0350 09/19/21 0400  K 3.5 3.7 4.3 4.1 4.4  MG 1.8 2.0 2.0 2.0  --   PHOS 3.1  --  3.4 3.7  --    Third spacing of fluid -Related to pancreatitis.  Per CT scan from 10/4, patient has a large volume of free fluid in the pelvis, bilateral pleural effusion left greater than  right. -Hemodynamically stable at this time. -Noted a plan from GI to obtain ultrasound abdomen pelvis +/-.  Paracentesis  Cholelithiasis, CBD dilatation  -CT abdomen from 10/4 showed gallstones and also persistent fusiform dilatation of the common bile duct with mild intrahepatic bile duct dilatation. Beak like narrowing of the mid and distal CBD noted at the level of the head of pancreas secondary to pancreatic edema. -No signs of choledocholithiasis.  -Liver enzymes trend as below.  AST and ALT are gradually trending up but alkaline phosphatase and bilirubin level remain normal.  Repeat liver enzymes in next blood draw. 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  No anticoagulation in the setting of necrotizing pancreatitis for the risk of transformation to hemorrhagic pancreatitis.  Essential hypertension -On nifedipine, lisinopril as an outpatient -Blood pressure medicines currently on hold  Deaf mutism -Supportive care.  ASL interpreter via iPad for communication.  Chronic anemia -Mild chronic anemia.  Currently stable above 10.  No evidence of bleeding. Recent Labs    08/27/21 0511 08/28/21 0425 08/29/21 0326 09/05/21 0339 09/13/21 0401  HGB 10.3* 10.4* 10.2* 10.2* 10.1*  MCV 98.1 98.5 98.1 97.8 99.1   Constipation -As needed Dulcolax.  Mobility: Encourage ambulation Code Status:   Code Status: Full Code  Nutritional status: Body mass index is 21.32 kg/m. Nutrition Problem: Severe Malnutrition  Etiology: acute illness (pancreatitis) Signs/Symptoms: severe fat depletion, severe muscle depletion, energy intake < or equal to 50% for > or equal to 5 days, percent weight loss Percent weight loss: 17 % Diet:  Diet Order             Diet full liquid Room service appropriate? Yes; Fluid consistency: Thin  Diet  effective now                  DVT prophylaxis:  enoxaparin (LOVENOX) injection 40 mg Start: 09/12/21 1000   Antimicrobials: Completed a course of IV Zosyn Fluid: TPN Consultants: GI Family Communication: Family not at bedside  Status is: Inpatient  Remains inpatient appropriate because: Continues to require TPN for necrotizing pancreatitis Dispo: The patient is from: Home              Anticipated d/c is to: Home in next several days              Patient currently is not medically stable to d/c.   Difficult to place patient No     Infusions:   TPN CYCLIC-ADULT (ION)      Scheduled Meds:  Chlorhexidine Gluconate Cloth  6 each Topical Daily   enoxaparin (LOVENOX) injection  40 mg Subcutaneous Daily   feeding supplement  1 Container Oral TID BM   pantoprazole (PROTONIX) IV  40 mg Intravenous Q24H   polyethylene glycol  17 g Oral Daily    Antimicrobials: Anti-infectives (From admission, onward)    Start     Dose/Rate Route Frequency Ordered Stop   08/25/21 0200  piperacillin-tazobactam (ZOSYN) IVPB 3.375 g        3.375 g 12.5 mL/hr over 240 Minutes Intravenous Every 8 hours 08/24/21 2009 09/08/21 0854   08/24/21 2200  piperacillin-tazobactam (ZOSYN) IVPB 3.375 g  Status:  Discontinued        3.375 g 100 mL/hr over 30 Minutes Intravenous Every 8 hours 08/24/21 1931 08/24/21 2009   08/24/21 1915  piperacillin-tazobactam (ZOSYN) IVPB 3.375 g        3.375 g 100 mL/hr over 30 Minutes Intravenous  Once 08/24/21 1911 08/24/21 2030       PRN meds: acetaminophen **OR** acetaminophen, bisacodyl, HYDROmorphone (DILAUDID) injection, lidocaine, ondansetron **OR** ondansetron (ZOFRAN) IV, phenol, prochlorperazine, sodium chloride flush   Objective: Vitals:   09/18/21 2115 09/19/21 0559  BP: 99/61 108/73  Pulse: 69 65  Resp: 20 16  Temp: 98.6 F (37 C) 98 F (36.7 C)  SpO2: 98% 98%    Intake/Output Summary (Last 24 hours) at 09/19/2021 1253 Last data filed at  09/19/2021 0845 Gross per 24 hour  Intake 2726.58 ml  Output 2075 ml  Net 651.58 ml   Filed Weights   08/28/21 0626 09/04/21 1209 09/10/21 1521  Weight: 77.2 kg 67.1 kg 67.4 kg   Weight change:  Body mass index is 21.32 kg/m.   Physical Exam: General exam: Pleasant, elderly Caucasian male.  Not in physical distress Skin: No rashes, lesions or ulcers. HEENT: Atraumatic, normocephalic, no obvious bleeding Lungs: Clear to auscultation bilaterally CVS: Regular rate and rhythm, no murmur GI/Abd soft, moderate diffuse tenderness in lower abdomen, nondistended, bowel sound present CNS: Alert, awake, at baseline has deaf-mutism Psychiatry: Frustrated with prolonged hospital stay Extremities: No pedal edema, no calf tenderness  Data Review: I have personally reviewed the laboratory data and studies available.  Recent Labs  Lab 09/13/21 0401  WBC 4.6  HGB 10.1*  HCT 31.7*  MCV 99.1  PLT  130*   Recent Labs  Lab 09/14/21 0407 09/15/21 0435 09/17/21 0426 09/18/21 0350 09/19/21 0400  NA 139 140 138 136 137  K 3.5 3.7 4.3 4.1 4.4  CL 105 109 104 103 102  CO2 32 30 30 30 30   GLUCOSE 128* 106* 118* 108* 114*  BUN 18 17 20 19 21   CREATININE 0.61 0.68 0.58* 0.58* 0.62  CALCIUM 8.4* 8.6* 8.3* 8.4* 8.4*  MG 1.8 2.0 2.0 2.0  --   PHOS 3.1  --  3.4 3.7  --     F/u labs ordered Unresulted Labs (From admission, onward)     Start     Ordered   08/28/21 0500  Comprehensive metabolic panel  (TPN Lab Panel)  Every Mon,Thu (0500),   R      08/27/21 1520   08/28/21 0500  Magnesium  (TPN Lab Panel)  Every Mon,Thu (0500),   R      08/27/21 1520   08/28/21 0500  Phosphorus  (TPN Lab Panel)  Every Mon,Thu (0500),   R      08/27/21 1520   08/28/21 0500  Triglycerides  (TPN Lab Panel)  Every Monday (0500),   R      08/27/21 1520            Signed, Terrilee Croak, MD [facility] 09/19/2021

## 2021-09-19 NOTE — Progress Notes (Signed)
PHARMACY - TOTAL PARENTERAL NUTRITION CONSULT NOTE   Indication:  NPO for necrotizing pancreatitis  Patient Measurements: Height: _0  (177.8 cm) Weight: 67.4 kg (148 lb 9.4 oz) IBW/kg (Calculated) : 73 TPN AdjBW (KG): 71.9 Body mass index is 21.32 kg/m. Usual Weight: 80 kg  Assessment: 47 yoM re-admitted for necrotizing pancreatitis and is NPO.  Initially planning for post-pyloric tube feeds, but unable to advance NGT d/t significant duodenal stricture from pancreatic abscess and surrounding edema. Pharmacy consulted to manage TPN.  Glucose / Insulin: No hx DM. CBG goal <180. SSI stopped 9/29 as they have been well-controlled Electrolytes:  Lytes are WNL Renal: SCr, BUN, bicarb stable & WNL Hepatic: AST/ALT previously low/normal, now increased to 57/72.  T.bili, Alk Phos WNL (10/10) TG WNL (10/10) I/O: Strict I/O ordered.  - UOP adequate, LBM 10/10 (stool x3 occurrences) GI Imaging: - 9/15 CTa/p: progressive necrotizing pancreatitis w/ 8 cm suspected abscess at pancreatic head, multiple other loculated collections along pancreatic body; common hepatic duct dilation w/ persistent gallstones in GB. - 9/17 UGI: minimal passage of contrast past duodenal stricture, likely d/t mass effect from pancreatic abscess/edema -9/25: CT a/p necrotizing pancreatitis, relatively stable pancreatic/peripancreatic necrotic collection - 9/30 AbXR: Weighted tip feeding tube now either in the gastric pylorus or first portion of the duodenum - 10/4 CT: interval improvement in acute pancreatitis GI Surgeries / Procedures:  - 8/29: Pancreatic stent placed - 9/16: NG tube placed - 9/23: NGT placed at proximal descending duodenum by IR - 9/26: NGT came out - 9/27: Dobhoff tube placement by IR - 10/4 EGD: removed pancreatic stent, removed feeding tube  Central access: Triple lumen PICC 9/19 TPN start date: 9/19  Nutritional Goals:  per RD Assessment (09/12/21):  Kcal: 2100-2300  Protein: 110-125  g Fluid: > 2 L/day  Continuous TPN formulation at goal rate of 90 mL/hr provides 114 g protein, 5456 kcals  Cyclic TPN formula:  2563 mL provides 120g protein, 2125 total Kcal, and GIR will be 4.91 while at max rate during 18 hour infusion.   Current Nutrition:  Full liquids and TPN, RD added boost breeze TID Meal intake not charted  Plan:  At 8937:  Continue cyclic TPN, 3428 mL over 18 hours.   Electrolytes in TPN:  Na - 125 mEq/L  K - 35 mEq/L Ca - 3 mEq/L Mg - 8 mEq/L Phos - 15 mmol/L Cl:Ac ratio 1:1 Continue standard MVI and trace elements in TPN Continue no CBG checks; restart CBGs if AM glucose rises No current MIVF; orders per MD if needed Monitor TPN labs on Mon/Thurs. Follow up ability to tolerate enteral diet   Gretta Arab PharmD, BCPS Clinical Pharmacist [facility] main pharmacy [phone] 09/19/2021 8:02 AM

## 2021-09-19 NOTE — Progress Notes (Signed)
VAST RN to patient's bedside to hang TPN. Pt's nurse, Yong Channel verbalized that pump has beeped occluded throughout the day. Assessed RA TL PICC; dsg dry and intact, all 3 lumens flushed easy and had good blood return. Advised Hunter if issue happens again, to try changing channel on IV pump. If issue continues to persist, a x-ray might need to be ordered. Hunter verbalized understanding.

## 2021-09-20 DIAGNOSIS — K8591 Acute pancreatitis with uninfected necrosis, unspecified: Secondary | ICD-10-CM | POA: Diagnosis not present

## 2021-09-20 LAB — GLUCOSE, CAPILLARY
Glucose-Capillary: 102 mg/dL — ABNORMAL HIGH (ref 70–99)
Glucose-Capillary: 124 mg/dL — ABNORMAL HIGH (ref 70–99)
Glucose-Capillary: 160 mg/dL — ABNORMAL HIGH (ref 70–99)

## 2021-09-20 MED ORDER — TRAVASOL 10 % IV SOLN
INTRAVENOUS | Status: AC
  Filled 2021-09-20: qty 1197

## 2021-09-20 NOTE — TOC Progression Note (Addendum)
Transition of Care [facility]) - Progression Note    Patient Details  Name: Hunter Ward MRN: [id number] Date of Birth: September 27, 1951  Transition [facility]) CM/SW Contact  Dainelle Hun, Juliann Pulse, RN Phone Number: 09/20/2021, 2:25 PM  Clinical Narrative:  [employer] rep Levada Dy following for HHPT,OnTPN may need @home -has picc-[employer] following & Pam rep for [employer]. Continue to monitor for d/c plans.    Expected Discharge Plan: [place] Barriers to Discharge: Continued Medical Work up  Expected Discharge Plan and Services Expected Discharge Plan: [place]   Discharge Planning Services: CM Consult Post Acute Care Choice: [place] arrangements for the past 2 months: Single Family Home                                       Social Determinants of Health (SDOH) Interventions    Readmission Risk Interventions Readmission Risk Prevention Plan 09/18/2021  Transportation Screening Complete  PCP or Specialist Appt within 3-5 Days Complete  HRI or [place] Complete  Social Work Consult for [place] Planning/Counseling Complete  Palliative Care Screening Complete  Medication Review Press photographer) Complete  Some recent data might be hidden

## 2021-09-20 NOTE — Progress Notes (Addendum)
Subjective: Patient switched from full liquid to soft diet yesterday.  No episodes of vomiting today. Does get full quickly, eating 10-25% of his food.  Has some RUQ discomfort and states feels it is swollen, worse after food.  Lower AB pain has improved.  Has had 2 liquid BM's today, passing gas.  He is up and walking with walker.     Objective: Vital signs in last 24 hours: Temp:  [97.9 F (36.6 C)-98.3 F (36.8 C)] 97.9 F (36.6 C) (10/12 1149) Pulse Rate:  [59-69] 69 (10/12 1149) Resp:  [13-20] 16 (10/12 1149) BP: (103-131)/(57-76) 131/76 (10/12 1149) SpO2:  [98 %-100 %] 100 % (10/12 1149) Weight:  [71.5 kg] 71.5 kg (10/11 1558) Weight change:  Last BM Date: 09/19/21  PE: Appears comfortable sitting on side window seat, no icterus GENERAL: Not in distress  ABDOMEN: Mild RUQ discomfort, mild distension noted but soft without masses, hypoactive bowel sounds. EXTREMITIES: No deformity, receiving IV TPN  Lab Results: Results for orders placed or performed during the hospital encounter of 08/24/21 (from the past 48 hour(s))  Glucose, capillary     Status: None   Collection Time: 09/18/21  6:12 PM  Result Value Ref Range   Glucose-Capillary 94 70 - 99 mg/dL    Comment: Glucose reference range applies only to samples taken after fasting for at least 8 hours.  Glucose, capillary     Status: Abnormal   Collection Time: 09/19/21 12:02 AM  Result Value Ref Range   Glucose-Capillary 108 (H) 70 - 99 mg/dL    Comment: Glucose reference range applies only to samples taken after fasting for at least 8 hours.  Basic metabolic panel     Status: Abnormal   Collection Time: 09/19/21  4:00 AM  Result Value Ref Range   Sodium 137 135 - 145 mmol/L   Potassium 4.4 3.5 - 5.1 mmol/L   Chloride 102 98 - 111 mmol/L   CO2 30 22 - 32 mmol/L   Glucose, Bld 114 (H) 70 - 99 mg/dL    Comment: Glucose reference range applies only to samples taken after fasting for at least 8 hours.   BUN 21 8 - 23  mg/dL   Creatinine, Ser 0.62 0.61 - 1.24 mg/dL   Calcium 8.4 (L) 8.9 - 10.3 mg/dL   GFR, Estimated >60 >60 mL/min    Comment: (NOTE) Calculated using the CKD-EPI Creatinine Equation (2021)    Anion gap 5 5 - 15    Comment: Performed at [facility], [place] [address], [place], [place] [address]  Glucose, capillary     Status: Abnormal   Collection Time: 09/19/21  5:56 AM  Result Value Ref Range   Glucose-Capillary 117 (H) 70 - 99 mg/dL    Comment: Glucose reference range applies only to samples taken after fasting for at least 8 hours.  Glucose, capillary     Status: Abnormal   Collection Time: 09/19/21 12:25 PM  Result Value Ref Range   Glucose-Capillary 127 (H) 70 - 99 mg/dL    Comment: Glucose reference range applies only to samples taken after fasting for at least 8 hours.  Glucose, capillary     Status: Abnormal   Collection Time: 09/19/21  6:11 PM  Result Value Ref Range   Glucose-Capillary 117 (H) 70 - 99 mg/dL    Comment: Glucose reference range applies only to samples taken after fasting for at least 8 hours.  Glucose, capillary     Status: Abnormal  Collection Time: 09/19/21 11:55 PM  Result Value Ref Range   Glucose-Capillary 111 (H) 70 - 99 mg/dL    Comment: Glucose reference range applies only to samples taken after fasting for at least 8 hours.  Glucose, capillary     Status: Abnormal   Collection Time: 09/20/21  6:18 AM  Result Value Ref Range   Glucose-Capillary 102 (H) 70 - 99 mg/dL    Comment: Glucose reference range applies only to samples taken after fasting for at least 8 hours.  Glucose, capillary     Status: Abnormal   Collection Time: 09/20/21 11:39 AM  Result Value Ref Range   Glucose-Capillary 124 (H) 70 - 99 mg/dL    Comment: Glucose reference range applies only to samples taken after fasting for at least 8 hours.    Studies/Results: US Abdomen Limited  Result Date: 09/20/2021 CLINICAL DATA:  Ascites EXAM: LIMITED ABDOMEN  ULTRASOUND FOR ASCITES TECHNIQUE: Limited ultrasound survey for ascites was performed in all four abdominal quadrants. COMPARISON:  None. FINDINGS: No sonographically evident free fluid in 4 quadrants of the abdomen. Extensive bowel gas, obscuring visualization. IMPRESSION: No sonographically evident ascites on the provided images. Electronically Signed   By: Maurine Simmering M.D.   On: 09/20/2021 09:18    Medications: I have reviewed the patient's current medications.  Assessment: Resolving acute severe pancreatitis, pancreatic necrosis, pancreatic fluid collections Loose stools Lower AB pain- improving with normal AB [place] without ascites 09/19/2021  CT 09/12/2021 showed  interval improvement in appearance of acute pancreatitis. , No signs of choledocholithiasis. AST 57 ALT 72 Alkphos 90 TBili 0.7- normal bili, slight increase in AST/ALT  Plan: Continue soft diet as tolerated Patient is on as needed antiemetics Can start on creon for possible EPI Continue to mobilize, get out of bed to chair, walk around in the hallways. Until patient is able to tolerate diet consistently, full TPN support recommended. Last CT on 09/12/2021 Getting repeat CMET tomorrow AM No fever, chills.  Continue supportive care.  Patient is interested in going home   Vladimir Crofts, [place] 09/20/2021, 1:16 PM

## 2021-09-20 NOTE — Progress Notes (Signed)
PHARMACY - TOTAL PARENTERAL NUTRITION CONSULT NOTE   Indication:  NPO for necrotizing pancreatitis  Patient Measurements: Height: 5' 10"  (177.8 cm) Weight: 71.5 kg (157 lb 10.1 oz) IBW/kg (Calculated) : 73 TPN AdjBW (KG): 71.9 Body mass index is 22.62 kg/m. Usual Weight: 80 kg  Assessment: 42 yoM re-admitted for necrotizing pancreatitis and is NPO.  Initially planning for post-pyloric tube feeds, but unable to advance NGT d/t significant duodenal stricture from pancreatic abscess and surrounding edema. Pharmacy consulted to manage TPN.  Glucose / Insulin: No hx DM. CBG goal <180. SSI stopped 9/29 as they have been well-controlled Electrolytes:  Lytes are WNL (last labs on 10/11) Renal: SCr, BUN, bicarb stable & WNL Hepatic: AST/ALT previously low/normal, now increased to 57/72.  T.bili, Alk Phos WNL (10/10) TG WNL (10/10) I/O: Strict I/O ordered.  - UOP significantly decreased (documentation, 825 mL, 3x occurrences) - Stool charted x1, but in progress notes, pt reports 8 very small liquid stools.    - RN reports nausea, but no emesis recorded GI Imaging: - 9/15 CTa/p: progressive necrotizing pancreatitis w/ 8 cm suspected abscess at pancreatic head, multiple other loculated collections along pancreatic body; common hepatic duct dilation w/ persistent gallstones in GB. - 9/17 UGI: minimal passage of contrast past duodenal stricture, likely d/t mass effect from pancreatic abscess/edema -9/25: CT a/p necrotizing pancreatitis, relatively stable pancreatic/peripancreatic necrotic collection - 9/30 AbXR: Weighted tip feeding tube now either in the gastric pylorus or first portion of the duodenum - 10/4 CT: interval improvement in acute pancreatitis - 10/11 [place]:  no significant ascites present (paracentesis canceled) GI Surgeries / Procedures:  - 8/29: Pancreatic stent placed - 9/16: NG tube placed - 9/23: NGT placed at proximal descending duodenum by IR - 9/26: NGT came out - 9/27:  Dobhoff tube placement by IR - 10/4 EGD: removed pancreatic stent, removed feeding tube  Central access: Triple lumen PICC 9/19 TPN start date: 9/19  Nutritional Goals:  per RD Assessment (09/19/21):  Kcal: 2100-2300  Protein: 110-125 g Fluid: > 2 L/day  Continuous TPN formulation at goal rate of 90 mL/hr provides 114 g protein, 0131 kcals  Cyclic TPN formula:  4388 mL provides 120g protein, 2125 total Kcal, and GIR will be 5.22 while at max rate during 16 hour infusion.   Current Nutrition:  Soft diet and TPN, RD added boost breeze TID Meal intake not charted; RN reports 25% of meal but with nausea and pain.  Plan:  At 8757:  Continue cyclic TPN, 9728 mL, decrease to 16 hour cycle on 10/12.  Electrolytes in TPN:  Na - 125 mEq/L  K - 35 mEq/L Ca - 3 mEq/L Mg - 8 mEq/L Phos - 15 mmol/L Cl:Ac ratio 1:1 Continue standard MVI and trace elements in TPN Continue no CBG checks; restart CBGs if AM glucose rises No current MIVF; orders per MD if needed Monitor TPN labs on Mon/Thurs. Follow up ability to tolerate enteral diet   Gretta Arab PharmD, BCPS Clinical Pharmacist [facility] main pharmacy [phone] 09/20/2021 7:18 AM

## 2021-09-20 NOTE — Progress Notes (Signed)
PROGRESS NOTE  Hunter Ward  DOB: December 07, 1951  PCP: Gaynelle Arabian, MD LEX:[id number]  DOA: 08/24/2021  LOS: 26 days  Hospital Day: 23   Chief Complaint  Patient presents with   Fever   Emesis   Abdominal Pain    Brief narrative: Hunter Ward is a 70 y.o. male with PMH significant for HTN, deaf mutism. Patient presented to the ED on 9/15 with complaint of worsening abdominal pain, nausea, vomiting. He was found to have evidence of progressive necrotizing pancreatitis. Admitted to hospitalist service GI consultation was obtained. Attempt for enteral nutrition failed.  TPN was started See below for details  Subjective: Patient was seen and examined this morning. Not in distress. Currently on soft diet.  10 to 25% of his fluid, feels full quickly. No vomiting.  Has mild abdominal discomfort and feels full after eating.  Passing gas, 2 liquid bowel movements today.  Assessment/Plan: Severe necrotizing pancreatitis -Patient remains hospitalized with progressive necrotizing pancreatitis which is complicated by suspected abscess.  Currently on IV Rocephin.  GI and IR following.  IR tried drain placement however it was not successful. -On 10/4, CT abdomen was repeated which showed an interval improvement in the appearance of acute pancreatitis.  An EGD was tried to advance NJ tube to leave the tip in the jejunum/postpyloric area but it failed.   -Currently patient is getting TPN through PICC line. -Severe nature of necrotizing pancreatitis seems to be resolving at this time.  Currently on soft diet per GI.  Eating only 10 to 25%.  Electrolytes stable.  Encourage ambulation. Recent Labs  Lab 09/14/21 0407 09/15/21 0435 09/17/21 0426 09/18/21 0350 09/19/21 0400  K 3.5 3.7 4.3 4.1 4.4  MG 1.8 2.0 2.0 2.0  --   PHOS 3.1  --  3.4 3.7  --     Third spacing of fluid -Related to pancreatitis. Per CT scan from 10/4, patient has a large volume of free fluid in the pelvis, bilateral  pleural effusion left greater than right. -Hemodynamically stable at this time. -No significant amount of ascites for paracentesis.  Cholelithiasis, CBD dilatation  -CT abdomen from 10/4 showed gallstones and also persistent fusiform dilatation of the common bile duct with mild intrahepatic bile duct dilatation. Beak like narrowing of the mid and distal CBD noted at the level of the head of pancreas secondary to pancreatic edema. -No signs of choledocholithiasis.  -Liver enzymes trend as below.  AST and ALT are gradually trending up but alkaline phosphatase and bilirubin level remain normal.  Repeat liver enzymes in next blood draw tomorrow. 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 No anticoagulation in the setting of necrotizing pancreatitis for the risk of transformation to hemorrhagic pancreatitis.  Essential hypertension -On nifedipine, lisinopril as an outpatient -Blood pressure medicines currently on hold  Deaf mutism -Supportive care.  ASL interpreter via iPad for communication.  Chronic anemia -Mild chronic anemia.  Currently stable above 10.  No evidence of bleeding. -Repeat CBC tomorrow. Recent Labs    08/27/21 0511 08/28/21 0425 08/29/21 0326 09/05/21 0339 09/13/21 0401  HGB 10.3* 10.4* 10.2* 10.2* 10.1*  MCV 98.1 98.5 98.1 97.8 99.1    Constipation -As needed Dulcolax.  Mobility: Encourage ambulation Code Status:   Code Status: Full Code  Nutritional status: Body mass index  is 22.62 kg/m. Nutrition Problem: Severe Malnutrition Etiology: acute illness (pancreatitis) Signs/Symptoms: severe fat depletion, severe muscle depletion, energy intake < or equal to 50% for > or equal to 5 days, percent weight loss Percent weight loss: 17 % Diet:  Diet Order             DIET SOFT Room service appropriate?  Yes; Fluid consistency: Thin  Diet effective now                  DVT prophylaxis:  enoxaparin (LOVENOX) injection 40 mg Start: 09/12/21 1000   Antimicrobials: Completed a course of IV Zosyn Fluid: TPN Consultants: GI Family Communication: Family not at bedside  Status is: Inpatient  Remains inpatient appropriate because: Continues to require TPN for necrotizing pancreatitis Dispo: The patient is from: Home              Anticipated d/c is to: Home in next several days              Patient currently is not medically stable to d/c.   Difficult to place patient No     Infusions:   TPN CYCLIC-ADULT (ION)      Scheduled Meds:  Chlorhexidine Gluconate Cloth  6 each Topical Daily   enoxaparin (LOVENOX) injection  40 mg Subcutaneous Daily   feeding supplement  1 Container Oral TID BM   pantoprazole (PROTONIX) IV  40 mg Intravenous Q24H   polyethylene glycol  17 g Oral Daily    Antimicrobials: Anti-infectives (From admission, onward)    Start     Dose/Rate Route Frequency Ordered Stop   08/25/21 0200  piperacillin-tazobactam (ZOSYN) IVPB 3.375 g        3.375 g 12.5 mL/hr over 240 Minutes Intravenous Every 8 hours 08/24/21 2009 09/08/21 0854   08/24/21 2200  piperacillin-tazobactam (ZOSYN) IVPB 3.375 g  Status:  Discontinued        3.375 g 100 mL/hr over 30 Minutes Intravenous Every 8 hours 08/24/21 1931 08/24/21 2009   08/24/21 1915  piperacillin-tazobactam (ZOSYN) IVPB 3.375 g        3.375 g 100 mL/hr over 30 Minutes Intravenous  Once 08/24/21 1911 08/24/21 2030       PRN meds: acetaminophen **OR** acetaminophen, bisacodyl, HYDROmorphone (DILAUDID) injection, lidocaine, ondansetron **OR** ondansetron (ZOFRAN) IV, phenol, prochlorperazine, sodium chloride flush   Objective: Vitals:   09/20/21 0621 09/20/21 1149  BP: 121/74 131/76  Pulse: (!) 59 69  Resp: 20 16  Temp: 98.2 F (36.8 C) 97.9 F (36.6 C)  SpO2: 99% 100%    Intake/Output Summary (Last 24  hours) at 09/20/2021 1547 Last data filed at 09/20/2021 1204 Gross per 24 hour  Intake 1600.79 ml  Output 1500 ml  Net 100.79 ml    Filed Weights   09/04/21 1209 09/10/21 1521 09/19/21 1558  Weight: 67.1 kg 67.4 kg 71.5 kg   Weight change:  Body mass index is 22.62 kg/m.   Physical Exam: General exam: Pleasant, elderly Caucasian male.  Not in physical distress Skin: No rashes, lesions or ulcers. HEENT: Atraumatic, normocephalic, no obvious bleeding Lungs: Clear to auscultation bilaterally CVS: Regular rate and rhythm, no murmur GI/Abd soft, mild diffuse tenderness in lower abdomen, nondistended, bowel sound present CNS: Alert, awake, at baseline has deaf-mutism Psychiatry: Frustrated with prolonged hospital stay Extremities: No pedal edema, no calf tenderness  Data Review: I have personally reviewed the laboratory data and studies available.  No results for input(s): WBC, NEUTROABS, HGB, HCT, MCV, PLT in the  last 168 hours.  Recent Labs  Lab 09/14/21 0407 09/15/21 0435 09/17/21 0426 09/18/21 0350 09/19/21 0400  NA 139 140 138 136 137  K 3.5 3.7 4.3 4.1 4.4  CL 105 109 104 103 102  CO2 32 30 30 30 30   GLUCOSE 128* 106* 118* 108* 114*  BUN 18 17 20 19 21   CREATININE 0.61 0.68 0.58* 0.58* 0.62  CALCIUM 8.4* 8.6* 8.3* 8.4* 8.4*  MG 1.8 2.0 2.0 2.0  --   PHOS 3.1  --  3.4 3.7  --      F/u labs ordered Unresulted Labs (From admission, onward)     Start     Ordered   09/21/21 0500  CBC with Differential/Platelet  Tomorrow morning,   R       Question:  Specimen collection method  Answer:  IV Team=IV Team collect   09/20/21 1544   08/28/21 0500  Comprehensive metabolic panel  (TPN Lab Panel)  Every Mon,Thu (0500),   R      08/27/21 1520   08/28/21 0500  Magnesium  (TPN Lab Panel)  Every Mon,Thu (0500),   R      08/27/21 1520   08/28/21 0500  Phosphorus  (TPN Lab Panel)  Every Mon,Thu (0500),   R      08/27/21 1520   08/28/21 0500  Triglycerides  (TPN Lab Panel)   Every Monday (0500),   R      08/27/21 1520            Signed, Terrilee Croak, MD [facility] 09/20/2021

## 2021-09-21 DIAGNOSIS — K8591 Acute pancreatitis with uninfected necrosis, unspecified: Secondary | ICD-10-CM | POA: Diagnosis not present

## 2021-09-21 LAB — COMPREHENSIVE METABOLIC PANEL
ALT: 28 U/L (ref 0–44)
AST: 20 U/L (ref 15–41)
Albumin: 2.8 g/dL — ABNORMAL LOW (ref 3.5–5.0)
Alkaline Phosphatase: 75 U/L (ref 38–126)
Anion gap: 5 (ref 5–15)
BUN: 24 mg/dL — ABNORMAL HIGH (ref 8–23)
CO2: 29 mmol/L (ref 22–32)
Calcium: 8.9 mg/dL (ref 8.9–10.3)
Chloride: 109 mmol/L (ref 98–111)
Creatinine, Ser: 0.57 mg/dL — ABNORMAL LOW (ref 0.61–1.24)
GFR, Estimated: >60 mL/min (ref >60)
Glucose, Bld: 121 mg/dL — ABNORMAL HIGH (ref 70–99)
Potassium: 4.6 mmol/L (ref 3.5–5.1)
Sodium: 143 mmol/L (ref 135–145)
Total Bilirubin: 0.3 mg/dL (ref 0.3–1.2)
Total Protein: 6.5 g/dL (ref 6.5–8.1)

## 2021-09-21 LAB — GLUCOSE, CAPILLARY
Glucose-Capillary: 125 mg/dL — ABNORMAL HIGH (ref 70–99)
Glucose-Capillary: 129 mg/dL — ABNORMAL HIGH (ref 70–99)
Glucose-Capillary: 182 mg/dL — ABNORMAL HIGH (ref 70–99)
Glucose-Capillary: 298 mg/dL — ABNORMAL HIGH (ref 70–99)

## 2021-09-21 LAB — CBC WITH DIFFERENTIAL/PLATELET
Abs Immature Granulocytes: 0.01 10*3/uL (ref 0.00–0.07)
Basophils Absolute: 0 10*3/uL (ref 0.0–0.1)
Basophils Relative: 0 %
Eosinophils Absolute: 0.2 10*3/uL (ref 0.0–0.5)
Eosinophils Relative: 4 %
HCT: 30.1 % — ABNORMAL LOW (ref 39.0–52.0)
Hemoglobin: 9.7 g/dL — ABNORMAL LOW (ref 13.0–17.0)
Immature Granulocytes: 0 %
Lymphocytes Relative: 40 %
Lymphs Abs: 1.7 10*3/uL (ref 0.7–4.0)
MCH: 32.4 pg (ref 26.0–34.0)
MCHC: 32.2 g/dL (ref 30.0–36.0)
MCV: 100.7 fL — ABNORMAL HIGH (ref 80.0–100.0)
Monocytes Absolute: 0.4 10*3/uL (ref 0.1–1.0)
Monocytes Relative: 10 %
Neutro Abs: 2 10*3/uL (ref 1.7–7.7)
Neutrophils Relative %: 46 %
Platelets: 122 10*3/uL — ABNORMAL LOW (ref 150–400)
RBC: 2.99 MIL/uL — ABNORMAL LOW (ref 4.22–5.81)
RDW: 15.2 % (ref 11.5–15.5)
WBC: 4.3 10*3/uL (ref 4.0–10.5)
nRBC: 0 % (ref 0.0–0.2)

## 2021-09-21 LAB — PHOSPHORUS: Phosphorus: 3.9 mg/dL (ref 2.5–4.6)

## 2021-09-21 LAB — MAGNESIUM: Magnesium: 1.9 mg/dL (ref 1.7–2.4)

## 2021-09-21 MED ORDER — MORPHINE SULFATE ER 15 MG PO TBCR: 15.0000 mg | EXTENDED_RELEASE_TABLET | Freq: Two times a day (BID) | ORAL | Status: DC

## 2021-09-21 MED ORDER — TRAVASOL 10 % IV SOLN
INTRAVENOUS | Status: AC
  Filled 2021-09-21: qty 1197

## 2021-09-21 MED ORDER — MORPHINE SULFATE ER 15 MG PO TBCR
15.0000 mg | EXTENDED_RELEASE_TABLET | Freq: Two times a day (BID) | ORAL | Status: DC
Start: 2021-09-21 — End: 2021-09-22
  Administered 2021-09-21 – 2021-09-22 (×2): 15 mg via ORAL
  Filled 2021-09-21 (×2): qty 1

## 2021-09-21 MED ORDER — DEXTROSE 10 % IV SOLN: INTRAVENOUS | Status: DC

## 2021-09-21 MED ORDER — PANCRELIPASE (LIP-PROT-AMYL) 12000-38000 UNITS PO CPEP
36000.0000 [IU] | ORAL_CAPSULE | Freq: Three times a day (TID) | ORAL | Status: DC
  Administered 2021-09-21 – 2021-09-22 (×3): 36000 [IU] via ORAL
  Filled 2021-09-21 (×3): qty 3

## 2021-09-21 MED ORDER — ENSURE ENLIVE PO LIQD
237.0000 mL | ORAL | Status: DC
  Administered 2021-09-22: 237 mL via ORAL

## 2021-09-21 NOTE — Progress Notes (Addendum)
PHARMACY - TOTAL PARENTERAL NUTRITION CONSULT NOTE   Indication:  NPO for necrotizing pancreatitis  Patient Measurements: Height: 5' 10"  (177.8 cm) Weight: 71.5 kg (157 lb 10.1 oz) IBW/kg (Calculated) : 73 TPN AdjBW (KG): 71.9 Body mass index is 22.62 kg/m. Usual Weight: 80 kg  Assessment: 35 yoM re-admitted for necrotizing pancreatitis and is NPO.  Initially planning for post-pyloric tube feeds, but unable to advance NGT d/t significant duodenal stricture from pancreatic abscess and surrounding edema. Pharmacy consulted to manage TPN.  Glucose / Insulin: No hx DM. CBG goal <180. SSI stopped 9/29 as they have been well-controlled Electrolytes:  Lytes are WNL (last labs on 10/13) Renal: SCr WNL, BUN bumped to 24, bicarb WNL (last labs 10/13) Hepatic: AST/ALT WNL.  T.bili, Alk Phos WNL (10/13) TG WNL (10/10) I/O: Strict I/O ordered.  - UOP unable to determine (documentation, 425 mL, 12x occurrences) - Stool charted x10    - Advanced to soft diet today tolerating 10-25 % of meals with no emesis reported GI Imaging: - 9/15 CTa/p: progressive necrotizing pancreatitis w/ 8 cm suspected abscess at pancreatic head, multiple other loculated collections along pancreatic body; common hepatic duct dilation w/ persistent gallstones in GB. - 9/17 UGI: minimal passage of contrast past duodenal stricture, likely d/t mass effect from pancreatic abscess/edema -9/25: CT a/p necrotizing pancreatitis, relatively stable pancreatic/peripancreatic necrotic collection - 9/30 AbXR: Weighted tip feeding tube now either in the gastric pylorus or first portion of the duodenum - 10/4 CT: interval improvement in acute pancreatitis - 10/11 [place]:  no significant ascites present (paracentesis canceled) GI Surgeries / Procedures:  - 8/29: Pancreatic stent placed - 9/16: NG tube placed - 9/23: NGT placed at proximal descending duodenum by IR - 9/26: NGT came out - 9/27: Dobhoff tube placement by IR - 10/4 EGD: removed  pancreatic stent, removed feeding tube  Central access: Triple lumen PICC 9/19 TPN start date: 9/19  10/13: patient lost TPN IV in AM at some point   Nutritional Goals:  per RD Assessment (09/19/21):  Kcal: 2100-2300  Protein: 110-125 g Fluid: > 2 L/day  Continuous TPN formulation at goal rate of 90 mL/hr provides 114 g protein, 1962 kcals  Cyclic TPN formula:  2297 mL provides 120g protein, 2125 total Kcal, and GIR will be 5.22 while at max rate during 16 hour infusion.   Current Nutrition:  Soft diet and TPN, RD added boost breeze TID Meal intake not charted; RN reports 25% of meal but with nausea and pain.  Plan:  No need for L89 on cyclic TPN that was near completion when TPN was inadvertently disconnected  At 1800:  Continue 16 hour cyclic TPN 2119 mL Electrolytes in TPN:  Na - 100 mEq/L  K - 30 mEq/L Ca - 3 mEq/L Mg - 8 mEq/L Phos - 15 mmol/L Cl:Ac ratio 1:1 Continue standard MVI and trace elements in TPN Continue CBG q 6 hours as ordered per MD,  No SSI No current MIVF; orders per MD if needed Monitor TPN labs on Mon/Thurs BMET, Mg, and Phos in AM Follow up ability to tolerate enteral diet   Napoleon Form  09/21/2021 7:25 AM

## 2021-09-21 NOTE — Progress Notes (Signed)
PROGRESS NOTE  [name]  DOB: 01/17/1951  PCP: Gaynelle Arabian, MD WCB:[id number]  DOA: 08/24/2021  LOS: 3 days  Hospital Day: 29   Chief Complaint  Patient presents with   Fever   Emesis   Abdominal Pain    Brief narrative: Hunter Ward is a 70 y.o. male with PMH significant for HTN, deaf mutism. Patient presented to the ED on 9/15 with complaint of worsening abdominal pain, nausea, vomiting. He was found to have evidence of progressive necrotizing pancreatitis. Admitted to hospitalist service GI consultation was obtained. Attempt for enteral nutrition failed.  TPN was started See below for details  Subjective: Patient was seen and examined this morning. Not in distress. Used ASL interpreter service to communicate. Patient has been tolerating soft diet.  He does not have any feeling of fullness after eating.  No vomiting.  Multiple small bowel movements. I discussed with GI.  Diet advanced to regular.  Assessment/Plan: Severe necrotizing pancreatitis -Patient remains hospitalized with progressive necrotizing pancreatitis which is complicated by suspected abscess.  Currently on IV Rocephin.  GI and IR following.  IR tried drain placement however it was not successful. -On 10/4, CT abdomen was repeated which showed an interval improvement in the appearance of acute pancreatitis.  An EGD was tried to advance NJ tube to leave the tip in the jejunum/postpyloric area but it failed.   -Currently patient is getting TPN through PICC line. -Severe nature of necrotizing pancreatitis seems to be resolving at this time.  Tolerating soft diet.  Advance to regular diet today.  If continues to improve, we may be able to stop TPN and plan to discharge him next 1 to 2 days. -Pancrelipase started for possible EPI. Recent Labs  Lab 09/15/21 0435 09/17/21 0426 09/18/21 0350 09/19/21 0400 09/21/21 0340  K 3.7 4.3 4.1 4.4 4.6  MG 2.0 2.0 2.0  --  1.9  PHOS  --  3.4 3.7  --  3.9     Third spacing of fluid -Related to pancreatitis. Per CT scan from 10/4, patient has a large volume of free fluid in the pelvis, bilateral pleural effusion left greater than right. -Hemodynamically stable at this time. -No significant amount of ascites for paracentesis.  Cholelithiasis, CBD dilatation  -CT abdomen from 10/4 showed gallstones and also persistent fusiform dilatation of the common bile duct with mild intrahepatic bile duct dilatation. Beak like narrowing of the mid and distal CBD noted at the level of the head of pancreas secondary to pancreatic edema. -No signs of choledocholithiasis.  -Liver enzymes trend as below showing improvement. 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 No anticoagulation in the setting of necrotizing pancreatitis for the risk of transformation to hemorrhagic pancreatitis.  Essential hypertension -On nifedipine, lisinopril as an outpatient -Blood pressure medicines currently on hold  Deaf mutism -Supportive care.  ASL interpreter via iPad for communication.  Chronic anemia -Mild chronic anemia.  Currently stable above 10.  No evidence of bleeding. -Repeat CBC tomorrow. Recent Labs    08/28/21 0425 08/29/21 0326 09/05/21 0339 09/13/21 0401 09/21/21 0340  HGB 10.4* 10.2* 10.2* 10.1* 9.7*  MCV 98.5 98.1 97.8 99.1 100.7*    Constipation -As needed Dulcolax.  Mobility: Encourage ambulation Code Status:   Code Status: Full Code  Nutritional status: Body mass  index is 22.62 kg/m. Nutrition Problem: Severe Malnutrition Etiology: acute illness (pancreatitis) Signs/Symptoms: severe fat depletion, severe muscle depletion, energy intake < or equal to 50% for > or equal to 5 days, percent weight loss Percent weight loss: 17 % Diet:  Diet Order             Diet regular Room service  appropriate? Yes; Fluid consistency: Thin  Diet effective now                  DVT prophylaxis:  enoxaparin (LOVENOX) injection 40 mg Start: 09/12/21 1000   Antimicrobials: Completed a course of IV Zosyn Fluid: TPN Consultants: GI Family Communication: Family not at bedside  Status is: Inpatient  Remains inpatient appropriate because: Continues to require TPN for necrotizing pancreatitis Dispo: The patient is from: Home              Anticipated d/c is to: Home in next several days              Patient currently is not medically stable to d/c.   Difficult to place patient No     Infusions:   TPN CYCLIC-ADULT (ION)      Scheduled Meds:  Chlorhexidine Gluconate Cloth  6 each Topical Daily   enoxaparin (LOVENOX) injection  40 mg Subcutaneous Daily   feeding supplement  1 Container Oral TID BM   feeding supplement  237 mL Oral Q24H   lipase/protease/amylase  36,000 Units Oral TID WC   pantoprazole (PROTONIX) IV  40 mg Intravenous Q24H   polyethylene glycol  17 g Oral Daily    Antimicrobials: Anti-infectives (From admission, onward)    Start     Dose/Rate Route Frequency Ordered Stop   08/25/21 0200  piperacillin-tazobactam (ZOSYN) IVPB 3.375 g        3.375 g 12.5 mL/hr over 240 Minutes Intravenous Every 8 hours 08/24/21 2009 09/08/21 0854   08/24/21 2200  piperacillin-tazobactam (ZOSYN) IVPB 3.375 g  Status:  Discontinued        3.375 g 100 mL/hr over 30 Minutes Intravenous Every 8 hours 08/24/21 1931 08/24/21 2009   08/24/21 1915  piperacillin-tazobactam (ZOSYN) IVPB 3.375 g        3.375 g 100 mL/hr over 30 Minutes Intravenous  Once 08/24/21 1911 08/24/21 2030       PRN meds: acetaminophen **OR** acetaminophen, bisacodyl, HYDROmorphone (DILAUDID) injection, lidocaine, ondansetron **OR** ondansetron (ZOFRAN) IV, phenol, prochlorperazine, sodium chloride flush   Objective: Vitals:   09/21/21 0504 09/21/21 1258  BP: 111/67 107/75  Pulse: 60 (!) 50  Resp: 18  18  Temp: 98 F (36.7 C) 98 F (36.7 C)  SpO2: 99% 100%    Intake/Output Summary (Last 24 hours) at 09/21/2021 1358 Last data filed at 09/21/2021 1300 Gross per 24 hour  Intake 2220.13 ml  Output 725 ml  Net 1495.13 ml    Filed Weights   09/04/21 1209 09/10/21 1521 09/19/21 1558  Weight: 67.1 kg 67.4 kg 71.5 kg   Weight change:  Body mass index is 22.62 kg/m.   Physical Exam: General exam: Pleasant, elderly Caucasian male.  Not in physical distress Skin: No rashes, lesions or ulcers. HEENT: Atraumatic, normocephalic, no obvious bleeding Lungs: Clear to auscultation bilaterally CVS: Regular rate and rhythm, no murmur GI/Abd soft, improved tenderness in lower abdomen, nondistended, bowel sound present CNS: Alert, awake, at baseline has deaf-mutism Psychiatry: Frustrated with prolonged hospital stay Extremities: No pedal edema, no calf tenderness  Data Review: I have personally reviewed the  laboratory data and studies available.  Recent Labs  Lab 09/21/21 0340  WBC 4.3  NEUTROABS 2.0  HGB 9.7*  HCT 30.1*  MCV 100.7*  PLT 122*    Recent Labs  Lab 09/15/21 0435 09/17/21 0426 09/18/21 0350 09/19/21 0400 09/21/21 0340  NA 140 138 136 137 143  K 3.7 4.3 4.1 4.4 4.6  CL 109 104 103 102 109  CO2 30 30 30 30 29   GLUCOSE 106* 118* 108* 114* 121*  BUN 17 20 19 21  24*  CREATININE 0.68 0.58* 0.58* 0.62 0.57*  CALCIUM 8.6* 8.3* 8.4* 8.4* 8.9  MG 2.0 2.0 2.0  --  1.9  PHOS  --  3.4 3.7  --  3.9     F/u labs ordered Unresulted Labs (From admission, onward)     Start     Ordered   09/22/21 9295  Basic metabolic panel  Tomorrow morning,   R       Question:  Specimen collection method  Answer:  IV Team=IV Team collect   09/21/21 1004   09/22/21 0500  Magnesium  Tomorrow morning,   R       Question:  Specimen collection method  Answer:  IV Team=IV Team collect   09/21/21 1004   09/22/21 0500  Phosphorus  Tomorrow morning,   R       Question:  Specimen  collection method  Answer:  IV Team=IV Team collect   09/21/21 1004   08/28/21 0500  Comprehensive metabolic panel  (TPN Lab Panel)  Every Mon,Thu (0500),   R      08/27/21 1520   08/28/21 0500  Magnesium  (TPN Lab Panel)  Every Mon,Thu (0500),   R      08/27/21 1520   08/28/21 0500  Phosphorus  (TPN Lab Panel)  Every Mon,Thu (0500),   R      08/27/21 1520   08/28/21 0500  Triglycerides  (TPN Lab Panel)  Every Monday (0500),   R      08/27/21 1520            Signed, Terrilee Croak, MD [facility] 09/21/2021

## 2021-09-21 NOTE — Progress Notes (Signed)
Nutrition Follow-up  DOCUMENTATION CODES:   Severe malnutrition in context of acute illness/injury  INTERVENTION:  - continue TPN per Pharmacist. - continue Boost Breeze TID. - will order Ensure Plus once/day, each supplement provides 350 kcal and 13 grams of protein. - recommend liberalize diet from Soft to Regular to increase food options; patient with poor dentition and self-limits to soft foods at baseline.   NUTRITION DIAGNOSIS:   Severe Malnutrition related to acute illness (pancreatitis) as evidenced by severe fat depletion, severe muscle depletion, energy intake < or equal to 50% for > or equal to 5 days, percent weight loss. -ongoing  GOAL:   Patient will meet greater than or equal to 90% of their needs -met with TPN and limited PO intakes.  MONITOR:   PO intake, Supplement acceptance, Labs, Weight trends, Other (Comment) (TPN regimen)   ASSESSMENT:   70 year old male with PMH pancreatitis, HTN, hx CCY (~30 yrs ago), deafness (uses ASL) who presented to the hospital with worsening abdomial pain and inability to keep any food down the past 3 days. His last meal kept down was on 08/22/21.  Significant Events:  PTA - 8/29- pancreatic stent placed  9/16 - admit for progressive necrotizing pancreatitis with 8 cm abscess of pancreatic head and multiple other collections, common hepatic duct dilations with persistent gallstones in GB, NG tube placed 9/17 - UGI, duodenal stricture due to mass effect from pancreatic abscess/edema 9/19 - triple lumen PICC placed in R basilic; TPN started 3/61 - NG tube placed at proximal descending duodenum by IR - unable to advance further  9/26 - NG tube out 9/27 - small bore feeding place in IR - unable to advance to LOT 9/30 - weighted feeding tip now in gastric pylorus or first portion of the duodenum; unable to advance further 10/3 - diet advanced to CLD 10/4 - EGD; removed pancreatic stent, removed feeding tube since it was not able to be  advanced further  10/9 - diet advanced to FLD 10/11- diet advanced to Soft   He ate 10% of breakfast and 25% of dinner last night.  Patient is deaf and uses ASL. Ipad in the room used Lelon Frohlich, [phone] assisted in discussion with patient). No visitors present. Patient reports that he had spaghetti with meat sauce for dinner last night and that it was very good. Breakfast tray of sausage, pancakes, and oatmeal delivered during RD visit.   The majority of  time was spent with patient sharing that he does not want this meal and would like an omelet with spinach and tomatoes. Able to communicate with [facility] and with nurse and MD via secure chat.   Patient shows RD his teeth and states that due to poor dentition he limits to soft foods. He has only had milk to drink, no foods, up to the time of RD visit.   He continues with cyclic TPN M08 hours/day which is providing 2100 ml, 2125 kcal, and 120 grams protein/day. He has accepted Boost Breeze 100% of the time offered since 10/9.   He has not been weighed since 10/11 at which time weight was trending back up toward admission weight. No edema present today.    Labs reviewed; CBGs: 125, 129, 182 mg/dl, BUN: 24 mg/dl, creatinine: 0.57 mg/dl. Medications reviewed; 17 g miralax/day.    Diet Order:   Diet Order             DIET SOFT Room service appropriate? Yes; Fluid consistency: Thin  Diet effective now  EDUCATION NEEDS:   No education needs have been identified at this time  Skin:  Skin Assessment: Reviewed RN Assessment  Last BM:  10/13 (type 6 x1)  Height:   Ht Readings from Last 1 Encounters:  08/25/21 _0  (1.778 m)    Weight:   Wt Readings from Last 1 Encounters:  09/19/21 71.5 kg     Estimated Nutritional Needs:  Kcal:  2100-2300 Protein:  110-125 grams Fluid:  > 2 L/day     Jarome Matin, MS, RD, LDN, CNSC Inpatient Clinical Dietitian RD pager # available in [facility]  After hours/weekend  pager # available in [facility]

## 2021-09-21 NOTE — Progress Notes (Signed)
Subjective: ALS interpreter used for sign language interpretation. Patient switched to soft diet, being advanced to regular diet. No episodes of vomiting today, some nausea responding to zofran.  Patient is controlled with medications and doing well.  He is having frequent small bowel movements, liquid.  He is up and walking with walker.     Objective: Vital signs in last 24 hours: Temp:  [98 F (36.7 C)-98.2 F (36.8 C)] 98 F (36.7 C) (10/13 0504) Pulse Rate:  [59-60] 60 (10/13 0504) Resp:  [16-18] 18 (10/13 0504) BP: (111-115)/(66-67) 111/67 (10/13 0504) SpO2:  [98 %-99 %] 99 % (10/13 0504) Weight change:  Last BM Date: 09/21/21  PE: Appears comfortable sitting on side window seat, no icterus GENERAL: Not in distress  ABDOMEN: Soft AB, hypoactive bowel sounds, mild epigastric tenderness, no rebound or guarding. EXTREMITIES: No deformity, receiving IV dextrose.   Lab Results: Results for orders placed or performed during the hospital encounter of 08/24/21 (from the past 48 hour(s))  Glucose, capillary     Status: Abnormal   Collection Time: 09/19/21 12:25 PM  Result Value Ref Range   Glucose-Capillary 127 (H) 70 - 99 mg/dL    Comment: Glucose reference range applies only to samples taken after fasting for at least 8 hours.  Glucose, capillary     Status: Abnormal   Collection Time: 09/19/21  6:11 PM  Result Value Ref Range   Glucose-Capillary 117 (H) 70 - 99 mg/dL    Comment: Glucose reference range applies only to samples taken after fasting for at least 8 hours.  Glucose, capillary     Status: Abnormal   Collection Time: 09/19/21 11:55 PM  Result Value Ref Range   Glucose-Capillary 111 (H) 70 - 99 mg/dL    Comment: Glucose reference range applies only to samples taken after fasting for at least 8 hours.  Glucose, capillary     Status: Abnormal   Collection Time: 09/20/21  6:18 AM  Result Value Ref Range   Glucose-Capillary 102 (H) 70 - 99 mg/dL    Comment: Glucose  reference range applies only to samples taken after fasting for at least 8 hours.  Glucose, capillary     Status: Abnormal   Collection Time: 09/20/21 11:39 AM  Result Value Ref Range   Glucose-Capillary 124 (H) 70 - 99 mg/dL    Comment: Glucose reference range applies only to samples taken after fasting for at least 8 hours.  Glucose, capillary     Status: Abnormal   Collection Time: 09/20/21  5:56 PM  Result Value Ref Range   Glucose-Capillary 160 (H) 70 - 99 mg/dL    Comment: Glucose reference range applies only to samples taken after fasting for at least 8 hours.  Glucose, capillary     Status: Abnormal   Collection Time: 09/21/21 12:17 AM  Result Value Ref Range   Glucose-Capillary 125 (H) 70 - 99 mg/dL    Comment: Glucose reference range applies only to samples taken after fasting for at least 8 hours.  Comprehensive metabolic panel     Status: Abnormal   Collection Time: 09/21/21  3:40 AM  Result Value Ref Range   Sodium 143 135 - 145 mmol/L   Potassium 4.6 3.5 - 5.1 mmol/L   Chloride 109 98 - 111 mmol/L   CO2 29 22 - 32 mmol/L   Glucose, Bld 121 (H) 70 - 99 mg/dL    Comment: Glucose reference range applies only to samples taken after fasting for at least 8  hours.   BUN 24 (H) 8 - 23 mg/dL   Creatinine, Ser 0.57 (L) 0.61 - 1.24 mg/dL   Calcium 8.9 8.9 - 10.3 mg/dL   Total Protein 6.5 6.5 - 8.1 g/dL   Albumin 2.8 (L) 3.5 - 5.0 g/dL   AST 20 15 - 41 U/L   ALT 28 0 - 44 U/L   Alkaline Phosphatase 75 38 - 126 U/L   Total Bilirubin 0.3 0.3 - 1.2 mg/dL   GFR, Estimated >60 >60 mL/min    Comment: (NOTE) Calculated using the CKD-EPI Creatinine Equation (2021)    Anion gap 5 5 - 15    Comment: Performed at [facility], [place] [address], [place], [place] [address]  Magnesium     Status: None   Collection Time: 09/21/21  3:40 AM  Result Value Ref Range   Magnesium 1.9 1.7 - 2.4 mg/dL    Comment: Performed at [facility], [place] [address] [place], [place] [address]  Phosphorus     Status: None   Collection Time: 09/21/21  3:40 AM  Result Value Ref Range   Phosphorus 3.9 2.5 - 4.6 mg/dL    Comment: Performed at [facility], [place] [address], [place], [place] [address]  CBC with Differential/Platelet     Status: Abnormal   Collection Time: 09/21/21  3:40 AM  Result Value Ref Range   WBC 4.3 4.0 - 10.5 K/uL   RBC 2.99 (L) 4.22 - 5.81 MIL/uL   Hemoglobin 9.7 (L) 13.0 - 17.0 g/dL   HCT 30.1 (L) 39.0 - 52.0 %   MCV 100.7 (H) 80.0 - 100.0 fL   MCH 32.4 26.0 - 34.0 pg   MCHC 32.2 30.0 - 36.0 g/dL   RDW 15.2 11.5 - 15.5 %   Platelets 122 (L) 150 - 400 K/uL    Comment: Immature Platelet Fraction may be clinically indicated, consider ordering this additional test [id number] REPEATED TO VERIFY    nRBC 0.0 0.0 - 0.2 %   Neutrophils Relative % 46 %   Neutro Abs 2.0 1.7 - 7.7 K/uL   Lymphocytes Relative 40 %   Lymphs Abs 1.7 0.7 - 4.0 K/uL   Monocytes Relative 10 %   Monocytes Absolute 0.4 0.1 - 1.0 K/uL   Eosinophils Relative 4 %   Eosinophils Absolute 0.2 0.0 - 0.5 K/uL   Basophils Relative 0 %   Basophils Absolute 0.0 0.0 - 0.1 K/uL   Immature Granulocytes 0 %   Abs Immature Granulocytes 0.01 0.00 - 0.07 K/uL    Comment: Performed at [facility], [place] [address], [place], [place] [address]  Glucose, capillary     Status: Abnormal   Collection Time: 09/21/21  5:05 AM  Result Value Ref Range   Glucose-Capillary 129 (H) 70 - 99 mg/dL    Comment: Glucose reference range applies only to samples taken after fasting for at least 8 hours.  Glucose, capillary     Status: Abnormal   Collection Time: 09/21/21 11:24 AM  Result Value Ref Range   Glucose-Capillary 182 (H) 70 - 99 mg/dL    Comment: Glucose reference range applies only to samples taken after fasting for at least 8 hours.    Studies/Results: US Abdomen Limited  Result Date: 09/20/2021 CLINICAL DATA:  Ascites EXAM: LIMITED  ABDOMEN ULTRASOUND FOR ASCITES TECHNIQUE: Limited ultrasound survey for ascites was performed in all four abdominal quadrants. COMPARISON:  None. FINDINGS: No sonographically evident free fluid in  4 quadrants of the abdomen. Extensive bowel gas, obscuring visualization. IMPRESSION: No sonographically evident ascites on the provided images. Electronically Signed   By: Maurine Simmering M.D.   On: 09/20/2021 09:18    Medications: I have reviewed the patient's current medications.  Assessment: Resolving acute severe pancreatitis, pancreatic necrosis, pancreatic fluid collections Loose stools Lower AB pain- improving with normal AB [place] without ascites 09/19/2021  CT 09/12/2021 showed  interval improvement in appearance of acute pancreatitis. No signs of choledocholithiasis. WBC 4.3 HGB 9.7 MCV 100.7 Platelets 122 GFR >60  AST 20 ALT 28 Alkphos 75 TBili 0.3   Plan: Continue soft diet as tolerated, can go to regular diet but patient will stay with soft foods. Patient is on as needed antiemetics Can start on creon for possible EPI Continue to mobilize, get out of bed to chair, walk around in the hallways. Until patient is able to tolerate diet consistently, full TPN support recommended. Last CT on 09/12/2021- will need repeat in 2-4 weeks. Labs were normal No fever, chills.  Patient is interested in going home, at this time he is doing well enough from a GI standpoint, discussed diet, walking, and signs to return to the office or hospital.   Vladimir Crofts, PA-C 09/21/2021, 12:22 PM

## 2021-09-21 NOTE — Progress Notes (Signed)
Arrived at bedside to d/c TPN infusion. Upon arrival, RN reports the pt came unhooked, and the rate was at 156ml/hr. TPN is cyclic, and the rate was to be decreased to 49ml/hr x 1 hour, then discontinue.Flushed the line with GBR. Scott., RPH on the floor. Consulted regarding TPN. RPH to order D10, and RN to hang. Next dose at 1800.

## 2021-09-22 DIAGNOSIS — K8591 Acute pancreatitis with uninfected necrosis, unspecified: Secondary | ICD-10-CM | POA: Diagnosis not present

## 2021-09-22 LAB — MAGNESIUM: Magnesium: 1.9 mg/dL (ref 1.7–2.4)

## 2021-09-22 LAB — GLUCOSE, CAPILLARY
Glucose-Capillary: 117 mg/dL — ABNORMAL HIGH (ref 70–99)
Glucose-Capillary: 136 mg/dL — ABNORMAL HIGH (ref 70–99)
Glucose-Capillary: 190 mg/dL — ABNORMAL HIGH (ref 70–99)
Glucose-Capillary: 338 mg/dL — ABNORMAL HIGH (ref 70–99)

## 2021-09-22 LAB — BASIC METABOLIC PANEL
Anion gap: 5 (ref 5–15)
BUN: 26 mg/dL — ABNORMAL HIGH (ref 8–23)
CO2: 27 mmol/L (ref 22–32)
Calcium: 8.5 mg/dL — ABNORMAL LOW (ref 8.9–10.3)
Chloride: 104 mmol/L (ref 98–111)
Creatinine, Ser: 0.59 mg/dL — ABNORMAL LOW (ref 0.61–1.24)
GFR, Estimated: >60 mL/min (ref >60)
Glucose, Bld: 113 mg/dL — ABNORMAL HIGH (ref 70–99)
Potassium: 4 mmol/L (ref 3.5–5.1)
Sodium: 136 mmol/L (ref 135–145)

## 2021-09-22 LAB — HEMOGLOBIN A1C
Hgb A1c MFr Bld: 5.6 % (ref 4.8–5.6)
Mean Plasma Glucose: 114.02 mg/dL

## 2021-09-22 LAB — PHOSPHORUS: Phosphorus: 3.6 mg/dL (ref 2.5–4.6)

## 2021-09-22 MED ORDER — LOPERAMIDE HCL 2 MG PO TABS: 2.0000 mg | ORAL_TABLET | Freq: Three times a day (TID) | ORAL | 0 refills | Status: AC | PRN

## 2021-09-22 MED ORDER — PANTOPRAZOLE SODIUM 40 MG PO TBEC: 40.0000 mg | DELAYED_RELEASE_TABLET | Freq: Every day | ORAL | 0 refills | Status: AC

## 2021-09-22 MED ORDER — ENSURE ENLIVE PO LIQD: 237.0000 mL | ORAL | 12 refills | Status: AC

## 2021-09-22 MED ORDER — PANCRELIPASE (LIP-PROT-AMYL) 36000-114000 UNITS PO CPEP: 36000.0000 [IU] | ORAL_CAPSULE | Freq: Three times a day (TID) | ORAL | 2 refills | Status: AC

## 2021-09-22 MED ORDER — HYDROMORPHONE HCL 2 MG PO TABS: 2.0000 mg | ORAL_TABLET | Freq: Four times a day (QID) | ORAL | 0 refills | Status: AC | PRN

## 2021-09-22 MED ORDER — PROCHLORPERAZINE MALEATE 5 MG PO TABS: 5.0000 mg | ORAL_TABLET | Freq: Four times a day (QID) | ORAL | 0 refills | Status: AC | PRN

## 2021-09-22 NOTE — TOC Transition Note (Signed)
Transition of Care [facility]) - CM/SW Discharge Note   Patient Details  Name: Hunter Ward MRN: [id number] Date of Birth: 04-Apr-1951  Transition of Care [facility]) CM/SW Contact:  Dessa Phi, RN Phone Number: 09/22/2021, 2:13 PM   Clinical Narrative: patient ordered for HHPT-[facility] already set up;no [facility] ordered or TPN. No further CM needs.      Final next level of care: [place] Barriers to Discharge: No Barriers Identified   Patient Goals and CMS Choice Patient states their goals for this hospitalization and ongoing recovery are:: go home-Deaf-uses sign language CMS Medicare.gov Compare Post Acute Care list provided to:: Patient Represenative (must comment) (sister [name] [phone]) Choice offered to / list presented to : Sibling  Discharge Placement                       Discharge Plan and Services   Discharge Planning Services: CM Consult Post Acute Care Choice: Home Health                    HH Arranged: PT [place]: [facility] Date [place]: 09/22/21 Time [place]: 1413 Representative spoke with at [place]: angela  Social Determinants of Health ([place]) Interventions     Readmission Risk Interventions Readmission Risk Prevention Plan 09/18/2021  Transportation Screening Complete  PCP or Specialist Appt within 3-5 Days Complete  HRI or [place] Complete  Social Work Consult for [place] Planning/Counseling Complete  Palliative Care Screening Complete  Medication Review Press photographer) Complete  Some recent data might be hidden

## 2021-09-22 NOTE — Progress Notes (Signed)
   09/22/21 1100  Mobility  Activity Ambulated in hall  Level of Assistance Standby assist, set-up cues, supervision of patient - no hands on  Assistive Device Front wheel walker  Distance Ambulated (ft) 400 ft  Mobility Ambulated with assistance in hallway  Mobility Response Tolerated well  Mobility performed by Mobility specialist  $Mobility charge 1 Mobility   Pt agreeable to mobilize this morning. Ambulated about 47ft in hall with RW, tolerated well. Took 1 standing break to look out the window. No complaints. Left pt EOB with ensure drink an call bell at side. Nurse notified of pt location and session.    [place] Specialist Acute Rehab Services Office: [phone]

## 2021-09-22 NOTE — Progress Notes (Signed)
[facility] Gastroenterology Progress Note  Hunter Ward 70 y.o. 1950-12-23  CC: Complicated pancreatitis   Subjective: Patient seen and examined at bedside.  We tried language interpreter service from [place] for 10 minutes without any answers.  Final history obtained with writing down on piece of paper.  Patient had some abdominal discomfort after advancing diet to regular.  Denied any nausea or vomiting    Objective: Vital signs in last 24 hours: Vitals:   09/21/21 2005 09/22/21 0457  BP: 120/77 104/65  Pulse: 78 64  Resp: 16 16  Temp: 99.1 F (37.3 C) 98.4 F (36.9 C)  SpO2: 98% 98%    Physical Exam:  General:  Sitting comfortably, not in acute distress              Abdomen:   Abdomen is soft without any tenderness, bowel sounds present.  No peritoneal signs. mild epigastric discomfort noted  Extremities: Extremities normal, atraumatic, no  edema       Lab Results: Recent Labs    09/21/21 0340 09/22/21 0244  NA 143 136  K 4.6 4.0  CL 109 104  CO2 29 27  GLUCOSE 121* 113*  BUN 24* 26*  CREATININE 0.57* 0.59*  CALCIUM 8.9 8.5*  MG 1.9 1.9  PHOS 3.9 3.6   Recent Labs    09/21/21 0340  AST 20  ALT 28  ALKPHOS 75  BILITOT 0.3  PROT 6.5  ALBUMIN 2.8*   Recent Labs    09/21/21 0340  WBC 4.3  NEUTROABS 2.0  HGB 9.7*  HCT 30.1*  MCV 100.7*  PLT 122*   No results for input(s): LABPROT, INR in the last 72 hours.    Assessment/Plan: -Severe complicated pancreatitis with pancreatic necrosis and pancreatic pseudocyst.  Improving.  Recommendations -------------------------- -Case discussed with hospitalist  -Diet was advanced to regular yesterday with some abdominal discomfort so diet has been switch back to soft diet today -I think it is okay to discharge patient home on soft diet/low-fat diet.   -Continue pancreatic enzyme supplement -Follow-up with [facility] in a few weeks to arrange for repeat CT scan for follow-up on severe necrotizing  pancreatitis. -GI will sign off.  Call us back if needed   Otis Brace MD, [place] 09/22/2021, 11:44 AM  Contact #  [phone]

## 2021-09-22 NOTE — Plan of Care (Signed)

## 2021-09-22 NOTE — Discharge Summary (Addendum)
Physician Discharge Summary  Hunter Ward WHQ:[id number] DOB: Apr 19, 1951 DOA: 08/24/2021  PCP: Gaynelle Arabian, MD  Admit date: 08/24/2021 Discharge date: 09/22/2021  Admitted From: Home Discharge disposition: Home with home health PT   Code Status: Full Code   Discharge Diagnosis:   Principal Problem:   Necrotizing pancreatitis Active Problems:   HTN (hypertension)   Normocytic anemia   Hyponatremia   Mild protein malnutrition ([place])   Deafness   Protein-calorie malnutrition, severe    Chief Complaint  Patient presents with   Fever   Emesis   Abdominal Pain    Brief narrative: Hunter Ward is a 70 y.o. male with PMH significant for HTN, deaf mutism. Patient presented to the ED on 9/15 with complaint of worsening abdominal pain, nausea, vomiting. He was found to have evidence of progressive necrotizing pancreatitis. Admitted to hospitalist service GI consultation was obtained. Attempt for enteral nutrition failed.  TPN was started See below for details  Subjective: Patient was seen and examined this morning.  Family was at bedside. Diet was advanced yesterday to regular however he had abdominal discomfort and hence he was switched back to soft diet.  He is tolerating soft diet.  Willing to go home.  Off TPN today.  Hospital course Severe necrotizing pancreatitis -Patient had a long course of hospitalization due to progressive necrotizing pancreatitis complicated by suspected abscess.  Completed a course of antibiotics.  GI and IR consults were obtained.  IR tried drain placement however it was not successful. -On 10/4, CT abdomen was repeated which showed an interval improvement in the appearance of acute pancreatitis.  An EGD was tried to advance NJ tube to leave the tip in the jejunum/postpyloric area but it failed.   -Patient was continued on TPN.  After several days of bowel rest, he was slowly started on clear liquid diet and advance to soft diet.  Currently  able to tolerate soft diet.  Regular diet was tried yesterday but did not tolerate.  Okay to discharge home today on soft diet.  No need to continue TPN at discharge. -He is having multiple frequent small bowel movements probably because of pancreatic enzyme deficiency.  He has been started on pancreatic enzyme supplement.  Can also try Imodium as needed. -I have communicated the plan clearly with patient and his family at bedside.  Third spacing of fluid -Related to pancreatitis. Per CT scan from 10/4, patient has a large volume of free fluid in the pelvis, bilateral pleural effusion left greater than right. -Hemodynamically stable at this time. -No significant amount of ascites for paracentesis.  Cholelithiasis, CBD dilatation  -CT abdomen from 10/4 showed gallstones and also persistent fusiform dilatation of the common bile duct with mild intrahepatic bile duct dilatation. Beak like narrowing of the mid and distal CBD noted at the level of the head of pancreas secondary to pancreatic edema.  -No signs of choledocholithiasis.  -Liver enzymes improved.  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 No anticoagulation in the setting of necrotizing pancreatitis for the risk of transformation to hemorrhagic pancreatitis.  Essential hypertension -On nifedipine, lisinopril as an outpatient -Blood pressure medicines are currently on hold and his blood pressure is stable.  No need to continue blood pressure medicines at home.  Deaf mutism -Supportive care.  [place] interpreter via iPad for communication.  Chronic anemia -Mild chronic anemia.  Currently stable close to 10.  No active bleeding.   Allergies as of 09/22/2021       Reactions  Codeine Itching, Nausea And Vomiting        Medication List     STOP taking these medications    docusate sodium 100 MG capsule Commonly known as: COLACE   lisinopril 20 MG tablet Commonly known as: ZESTRIL   NIFEdipine 30 MG 24 hr  tablet Commonly known as: PROCARDIA-XL/NIFEDICAL-XL       TAKE these medications    feeding supplement Liqd Take 237 mLs by mouth daily. Start taking on: September 23, 2021   HYDROmorphone 2 MG tablet Commonly known as: DILAUDID Take 1 tablet (2 mg total) by mouth every 6 (six) hours as needed for up to 5 days for severe pain. What changed: how much to take   lipase/protease/amylase 36000 UNITS Cpep capsule Commonly known as: CREON Take 1 capsule (36,000 Units total) by mouth 3 (three) times daily with meals.   loperamide 2 MG tablet Commonly known as: Imodium A-D Take 1 tablet (2 mg total) by mouth 3 (three) times daily as needed for up to 10 days for diarrhea or loose stools.   pantoprazole 40 MG tablet Commonly known as: PROTONIX Take 1 tablet (40 mg total) by mouth daily.   prochlorperazine 5 MG tablet Commonly known as: COMPAZINE Take 1 tablet (5 mg total) by mouth every 6 (six) hours as needed for nausea or vomiting.        Discharge Instructions:  Diet Recommendation: Soft cardiac diet   @BRDDSCINSTRUCTIONS @  Follow ups:    [facility] [place] [facility] Follow [facility].   Specialty: [place] Why: [facility] physical therapy Contact information: [place] [address] [phone]         Ronnette Juniper, MD. Schedule an appointment as soon as possible for a visit in 3 week(s).   Specialty: Gastroenterology Why: Follow-up on complicated pancreatitis Contact information: [place] [address] [phone]         Gaynelle Arabian, MD Follow up.   Specialty: Family Medicine Contact information: [address] [place] [address] [phone]                 Wound care:     Discharge Exam:   Vitals:   09/21/21 0504 09/21/21 1258 09/21/21 2005 09/22/21 0457  BP: 111/67 107/75 120/77 104/65  Pulse: 60 (!) 50 78 64  Resp: 18 18 16 16    Temp: 98 F (36.7 C) 98 F (36.7 C) 99.1 F (37.3 C) 98.4 F (36.9 C)  TempSrc:  Oral Oral Oral  SpO2: 99% 100% 98% 98%  Weight:      Height:        Body mass index is 22.62 kg/m.  General exam: Pleasant elderly Caucasian male.  Not in distress Skin: No rashes, lesions or ulcers. HEENT: Atraumatic, normocephalic, no obvious bleeding Lungs: Clear to auscultation bilaterally CVS: Regular rate and rhythm, no murmur GI/Abd soft, nontender, nondistended, bowel sound present CNS: Alert, awake, oriented x3 Psychiatry: Mood appropriate Extremities: No pedal edema, no calf tenderness  Time coordinating discharge: 35 minutes   The results of significant diagnostics from this hospitalization (including imaging, microbiology, ancillary and laboratory) are listed below for reference.    Procedures and Diagnostic Studies:   DG Abd 1 View  Result Date: 08/25/2021 CLINICAL DATA:  Feeding tube placement EXAM: ABDOMEN - 1 VIEW COMPARISON:  CT 08/24/2021 FINDINGS: An enteric tube has been placed with tip in the right  upper quadrant consistent with location in the distal stomach or duodenal bulb. Probable biliary stent demonstrated in the right upper quadrant without change in position. Gas-filled nondistended colon, possibly ileus. IMPRESSION: Enteric tube tip is in the right upper quadrant suggesting location in the distal stomach or duodenal bulb region. Electronically Signed   By: Lucienne Capers M.D.   On: 08/25/2021 19:37   CT ABDOMEN PELVIS W CONTRAST  Result Date: 08/24/2021 CLINICAL DATA:  Abdominal pain, fever, recent pancreatitis with ERCP and stent placement, nausea and vomiting EXAM: CT ABDOMEN AND PELVIS WITH CONTRAST TECHNIQUE: Multidetector CT imaging of the abdomen and pelvis was performed using the standard protocol following bolus administration of intravenous contrast. CONTRAST:  180mL OMNIPAQUE IOHEXOL 350 MG/ML SOLN COMPARISON:  08/06/2021, 08/05/2021 FINDINGS: Lower chest:  There are small bilateral pleural effusions volume estimated less than 100 cc each. Minimal dependent lower lobe atelectasis. Hepatobiliary: The gallbladder remnant seen on prior exam is again identified, with multiple calcified gallstones measuring up to 1 cm in size. Dilation of the common hepatic duct measuring 14 mm is unchanged. No significant intrahepatic biliary duct dilation. No evidence of downstream calculi within the common bile duct. Pancreas: Since the previous examination, there has been significant progression of the inflammatory changes surrounding the pancreas. Within the pancreatic head, there has been development of multiloculated fluid collection within the pancreatic parenchyma, with punctate foci of gas, worrisome for necrotic pancreatitis and subsequent abscess. This measures up to 8.0 x 5.2 cm reference image 34/2. Multilocular fluid collections are seen involving the ventral aspect of the pancreatic body and tail, measuring up to 2.4 cm in thickness, consistent with either further necrotic changes or multilocular pseudocyst. There is only minimal normal enhancing pancreatic tissue identified on this study. Pancreatic duct stent is seen extending from the pancreatic head into the duodenal lumen. Spleen: Normal in size without focal abnormality. Adrenals/Urinary Tract: Stable left adrenal adenoma. Right adrenals unremarkable. The kidneys enhance normally and symmetrically, without urinary tract calculi or obstructive uropathy. The bladder is decompressed. Stomach/Bowel: No bowel obstruction or ileus. Normal appendix right lower quadrant. No bowel wall thickening or inflammatory change. Vascular/Lymphatic: The splenic vein is difficult to visualize, likely secondary to extrinsic compression by the inflammatory process within the pancreatic bed. Splenic vein occlusion cannot be excluded, and there are new collateral venous structures in the left upper quadrant consistent with varices. There is  extrinsic compression upon the SMV the near the portal confluence, but the SMV remains patent. The portal vein is widely patent. Mild atherosclerosis of the aorta again noted. No pathologic adenopathy within the abdomen or pelvis. Reproductive: Prostate is unremarkable. Other: There is a small amount of free fluid within the lower pelvis. No free intraperitoneal gas. No abdominal wall hernia. Musculoskeletal: No acute or destructive bony lesions. Reconstructed images demonstrate no additional findings. IMPRESSION: 1. Progressive necrotizing pancreatitis as above, with 8.0 x 5.2 cm suspected abscess replacing the pancreatic head. Multilocular fluid collections along the pancreatic body may reflect further necrotic changes versus organizing pseudocysts. There is only minimal normal enhancing pancreatic tissue identified. 2. Suspected splenic vein thrombosis, with extrinsic compression on the superior mesenteric vein by the pancreatic abscess. Portal vein remains patent. 3. Stable common hepatic duct dilation. Persistent gallstones within the gallbladder remnant, with no evidence of downstream choledocholithiasis. 4. Indwelling pancreatic duct stent extending into the duodenal lumen. 5. Trace pelvic free fluid. 6. Small bilateral pleural effusions. 7.  Aortic Atherosclerosis (ICD10-I70.0). Electronically Signed   By: Diana Eves.D.  On: 08/24/2021 18:50   US LIVER DOPPLER LIMITED (PV OR SINGLE ART/VEIN)  Result Date: 08/25/2021 CLINICAL DATA:  Splenic vein thrombosis. Acute necrotizing pancreatitis. EXAM: DUPLEX ULTRASOUND OF LIVER TECHNIQUE: Color and duplex Doppler ultrasound was performed to evaluate the hepatic in-flow and out-flow vessels. COMPARISON:  CT Abdomen Pelvis, 08/24/2021. FINDINGS: Suboptimal evaluation secondary to poor acoustic penetration. Liver: Normal parenchymal echogenicity. Normal hepatic contour without nodularity. No focal lesion, mass or intrahepatic biliary ductal dilatation. Main  Portal Vein size: 1.0 cm Portal Vein Velocities Main Prox:  22.7 cm/sec Main Mid: 20.4 cm/sec Main Dist:  13.9 cm/sec Right: 10.2 cm/sec Left: 10.8 cm/sec Hepatic Vein Velocities Right:  19.9 cm/sec Middle:  22.7 cm/sec Left:  35.2 cm/sec IVC: Present and patent with normal respiratory phasicity. Hepatic Artery Velocity:  196 cm/sec Splenic Vein Velocity:  15.8 cm/sec Spleen: 8.7 x 11.0 x 5.8 cm with a total volume of 288 cm^3 (411 cm^3 is upper limit normal). Small inferior pole accessory spleen. Portal Vein Occlusion/Thrombus: No Splenic Vein Occlusion/Thrombus: No Ascites: No large volume intra-abdominal ascites. Varices: None Incidental, bilateral pleural effusions. Trace perihepatic ascites. CBD dilation, measuring up to 1.3 cm and likely post cholecystectomy reservoir effect. IMPRESSION: 1. Patent portal and splenic veins. 2. Incidental bilateral pleural effusions and trace perihepatic ascites. No large volume intra-abdominal ascites. Michaelle Birks, MD [facility] Radiology Electronically Signed   By: Michaelle Birks M.D.   On: 08/25/2021 08:08     Labs:   Basic Metabolic Panel: Recent Labs  Lab 09/17/21 0426 09/18/21 0350 09/19/21 0400 09/21/21 0340 09/22/21 0244  NA 138 136 137 143 136  K 4.3 4.1 4.4 4.6 4.0  CL 104 103 102 109 104  CO2 30 30 30 29 27   GLUCOSE 118* 108* 114* 121* 113*  BUN 20 19 21  24* 26*  CREATININE 0.58* 0.58* 0.62 0.57* 0.59*  CALCIUM 8.3* 8.4* 8.4* 8.9 8.5*  MG 2.0 2.0  --  1.9 1.9  PHOS 3.4 3.7  --  3.9 3.6   GFR Estimated Creatinine Clearance: 85.7 mL/min (A) (by C-G formula based on SCr of 0.59 mg/dL (L)). Liver Function Tests: Recent Labs  Lab 09/18/21 0350 09/21/21 0340  AST 57* 20  ALT 72* 28  ALKPHOS 90 75  BILITOT 0.7 0.3  PROT 6.1* 6.5  ALBUMIN 2.6* 2.8*   No results for input(s): LIPASE, AMYLASE in the last 168 hours. No results for input(s): AMMONIA in the last 168 hours. Coagulation  profile No results for input(s): INR, PROTIME in the last 168 hours.  CBC: Recent Labs  Lab 09/21/21 0340  WBC 4.3  NEUTROABS 2.0  HGB 9.7*  HCT 30.1*  MCV 100.7*  PLT 122*   Cardiac Enzymes: No results for input(s): CKTOTAL, CKMB, CKMBINDEX, TROPONINI in the last 168 hours. BNP: Invalid input(s): POCBNP CBG: Recent Labs  Lab 09/21/21 1753 09/22/21 0007 09/22/21 0343 09/22/21 0817 09/22/21 1157  GLUCAP 298* 338* 190* 136* 117*   D-Dimer No results for input(s): DDIMER in the last 72 hours. Hgb A1c Recent Labs    09/22/21 0244  HGBA1C 5.6   Lipid Profile No results for input(s): CHOL, HDL, LDLCALC, TRIG, CHOLHDL, LDLDIRECT in the last 72 hours. Thyroid function studies No results for input(s): TSH, T4TOTAL, T3FREE, THYROIDAB in the last 72 hours.  Invalid input(s): FREET3 Anemia work up No results for input(s): VITAMINB12, FOLATE, FERRITIN, TIBC, IRON, RETICCTPCT in the last 72 hours. Microbiology No results found for this or any previous visit (  from the past 240 hour(s)).   Signed: Terrilee Croak  [facility] 09/22/2021, 2:04 PM

## 2021-09-22 NOTE — Progress Notes (Signed)
Patient's CBG at 0007 was 338. Notified on call provider about patient's CBG. On call provider gave no orders other than told nurse to recheck it at 0300. Patient's CBG at 0343 was 190.

## 2021-09-24 DIAGNOSIS — E441 Mild protein-calorie malnutrition: Secondary | ICD-10-CM | POA: Diagnosis not present

## 2021-09-24 DIAGNOSIS — K5900 Constipation, unspecified: Secondary | ICD-10-CM | POA: Diagnosis not present

## 2021-09-24 DIAGNOSIS — K8020 Calculus of gallbladder without cholecystitis without obstruction: Secondary | ICD-10-CM | POA: Diagnosis not present

## 2021-09-24 DIAGNOSIS — I10 Essential (primary) hypertension: Secondary | ICD-10-CM | POA: Diagnosis not present

## 2021-09-24 DIAGNOSIS — Z9049 Acquired absence of other specified parts of digestive tract: Secondary | ICD-10-CM | POA: Diagnosis not present

## 2021-09-24 DIAGNOSIS — H913 Deaf nonspeaking, not elsewhere classified: Secondary | ICD-10-CM | POA: Diagnosis not present

## 2021-09-24 DIAGNOSIS — D735 Infarction of spleen: Secondary | ICD-10-CM | POA: Diagnosis not present

## 2021-09-24 DIAGNOSIS — D649 Anemia, unspecified: Secondary | ICD-10-CM | POA: Diagnosis not present

## 2021-09-24 DIAGNOSIS — K8591 Acute pancreatitis with uninfected necrosis, unspecified: Secondary | ICD-10-CM | POA: Diagnosis not present

## 2021-10-24 DIAGNOSIS — K8020 Calculus of gallbladder without cholecystitis without obstruction: Secondary | ICD-10-CM | POA: Diagnosis not present

## 2021-10-24 DIAGNOSIS — K8591 Acute pancreatitis with uninfected necrosis, unspecified: Secondary | ICD-10-CM | POA: Diagnosis not present

## 2021-11-16 ENCOUNTER — Ambulatory Visit
Admission: RE | Admit: 2021-11-16 | Discharge: 2021-11-16 | Disposition: A | Discharge status: Home / Self Care | Payer: Medicare Other | Source: Ambulatory Visit | Attending: Gastroenterology | Admitting: Gastroenterology

## 2021-11-16 ENCOUNTER — Other Ambulatory Visit: Payer: Self-pay

## 2021-11-16 DIAGNOSIS — I700 Atherosclerosis of aorta: Secondary | ICD-10-CM | POA: Diagnosis not present

## 2021-11-16 DIAGNOSIS — D3500 Benign neoplasm of unspecified adrenal gland: Secondary | ICD-10-CM | POA: Diagnosis not present

## 2021-11-16 DIAGNOSIS — K8581 Other acute pancreatitis with uninfected necrosis: Secondary | ICD-10-CM

## 2021-11-16 DIAGNOSIS — K859 Acute pancreatitis without necrosis or infection, unspecified: Secondary | ICD-10-CM | POA: Diagnosis not present

## 2021-11-16 DIAGNOSIS — K8689 Other specified diseases of pancreas: Secondary | ICD-10-CM | POA: Diagnosis not present

## 2021-11-16 MED ORDER — IOPAMIDOL (ISOVUE-300) INJECTION 61%
100.0000 mL | Freq: Once | INTRAVENOUS | Status: AC | PRN
  Administered 2021-11-16: 100 mL via INTRAVENOUS

## 2021-11-17 ENCOUNTER — Other Ambulatory Visit: Payer: Self-pay | Admitting: Gastroenterology

## 2021-11-17 DIAGNOSIS — R9389 Abnormal findings on diagnostic imaging of other specified body structures: Secondary | ICD-10-CM

## 2021-12-07 ENCOUNTER — Ambulatory Visit
Admission: RE | Admit: 2021-12-07 | Discharge: 2021-12-07 | Disposition: A | Discharge status: Home / Self Care | Payer: Medicare Other | Source: Ambulatory Visit | Attending: Gastroenterology | Admitting: Gastroenterology

## 2021-12-07 ENCOUNTER — Other Ambulatory Visit: Payer: Self-pay

## 2021-12-07 DIAGNOSIS — D3500 Benign neoplasm of unspecified adrenal gland: Secondary | ICD-10-CM | POA: Diagnosis not present

## 2021-12-07 DIAGNOSIS — M47816 Spondylosis without myelopathy or radiculopathy, lumbar region: Secondary | ICD-10-CM | POA: Diagnosis not present

## 2021-12-07 DIAGNOSIS — K838 Other specified diseases of biliary tract: Secondary | ICD-10-CM | POA: Diagnosis not present

## 2021-12-07 DIAGNOSIS — K8050 Calculus of bile duct without cholangitis or cholecystitis without obstruction: Secondary | ICD-10-CM | POA: Diagnosis not present

## 2021-12-07 DIAGNOSIS — R9389 Abnormal findings on diagnostic imaging of other specified body structures: Secondary | ICD-10-CM

## 2021-12-07 MED ORDER — GADOBENATE DIMEGLUMINE 529 MG/ML IV SOLN
15.0000 mL | Freq: Once | INTRAVENOUS | Status: AC | PRN
  Administered 2021-12-07: 10:00:00 15 mL via INTRAVENOUS

## 2021-12-21 DIAGNOSIS — H9190 Unspecified hearing loss, unspecified ear: Secondary | ICD-10-CM | POA: Diagnosis not present

## 2021-12-21 DIAGNOSIS — Z885 Allergy status to narcotic agent status: Secondary | ICD-10-CM | POA: Diagnosis not present

## 2021-12-21 DIAGNOSIS — M47816 Spondylosis without myelopathy or radiculopathy, lumbar region: Secondary | ICD-10-CM | POA: Diagnosis not present

## 2021-12-21 DIAGNOSIS — I10 Essential (primary) hypertension: Secondary | ICD-10-CM | POA: Diagnosis not present

## 2021-12-21 DIAGNOSIS — D3502 Benign neoplasm of left adrenal gland: Secondary | ICD-10-CM | POA: Diagnosis not present

## 2021-12-21 DIAGNOSIS — M5136 Other intervertebral disc degeneration, lumbar region: Secondary | ICD-10-CM | POA: Diagnosis not present

## 2021-12-21 DIAGNOSIS — Z9049 Acquired absence of other specified parts of digestive tract: Secondary | ICD-10-CM | POA: Diagnosis not present

## 2021-12-21 DIAGNOSIS — K8050 Calculus of bile duct without cholangitis or cholecystitis without obstruction: Secondary | ICD-10-CM | POA: Diagnosis not present

## 2021-12-21 DIAGNOSIS — K8689 Other specified diseases of pancreas: Secondary | ICD-10-CM | POA: Diagnosis not present

## 2021-12-21 DIAGNOSIS — D649 Anemia, unspecified: Secondary | ICD-10-CM | POA: Diagnosis not present

## 2021-12-21 DIAGNOSIS — K838 Other specified diseases of biliary tract: Secondary | ICD-10-CM | POA: Diagnosis not present

## 2021-12-21 DIAGNOSIS — K219 Gastro-esophageal reflux disease without esophagitis: Secondary | ICD-10-CM | POA: Diagnosis not present

## 2022-01-11 DIAGNOSIS — R7301 Impaired fasting glucose: Secondary | ICD-10-CM | POA: Diagnosis not present

## 2022-01-11 DIAGNOSIS — Z23 Encounter for immunization: Secondary | ICD-10-CM | POA: Diagnosis not present

## 2022-01-11 DIAGNOSIS — I10 Essential (primary) hypertension: Secondary | ICD-10-CM | POA: Diagnosis not present

## 2022-01-11 DIAGNOSIS — K831 Obstruction of bile duct: Secondary | ICD-10-CM | POA: Diagnosis not present

## 2022-03-26 IMAGING — MR MR ABDOMEN WO/W CM MRCP
12 of 20 series · 24 of 48 positions shown · IV contrast (15 ML MULTIHANCE)
Comparison: Multiple exams, including 11/16/2021

CLINICAL DATA: Pancreatitis with punctate calcifications along the
pancreatic head on prior CT, for further workup.

EXAM:
MRI ABDOMEN WITHOUT AND WITH CONTRAST (INCLUDING MRCP)
TECHNIQUE: Multiplanar multisequence MR imaging of the abdomen was performed
both before and after the administration of intravenous contrast.
Heavily T2-weighted images of the biliary and pancreatic ducts were
obtained, and three-dimensional MRCP images were rendered by post
processing.
CONTRAST:  15mL MULTIHANCE GADOBENATE DIMEGLUMINE 529 MG/ML IV SOLN

[Series 2: cor haste · coronal · 5.0mm · 0.74mm/px · 2 of 38 slices shown]
[im 1/38]
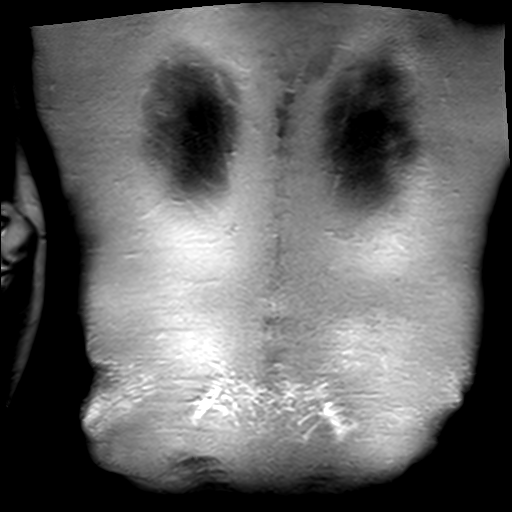
[im 38/38]
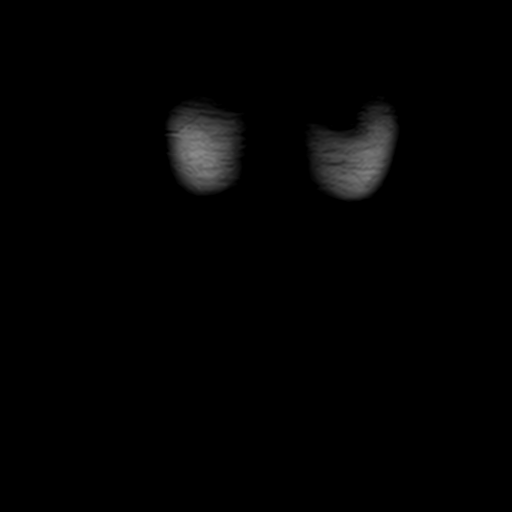

[Series 3: axial haste · axial · 6.0mm · 0.78mm/px · z∈[-62,+162]mm · 2 of 35 slices shown]
[im 1/35]
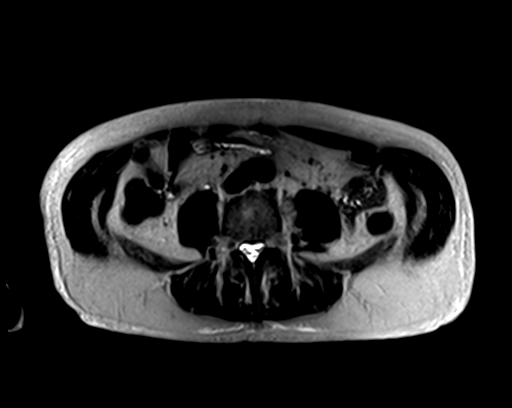
[im 35/35]
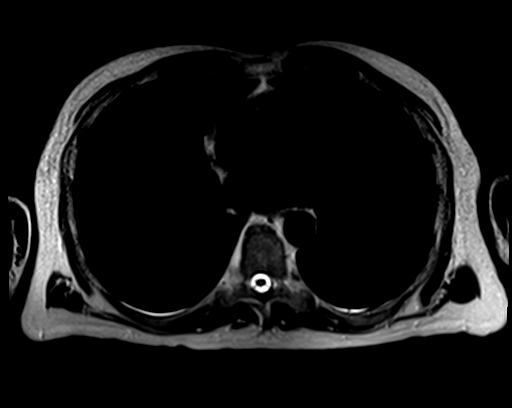

[Series 4: T2 · axial · 6.0mm · 1.12mm/px · 1 of 30 slices shown (1 of 2)]
[im 1/30]
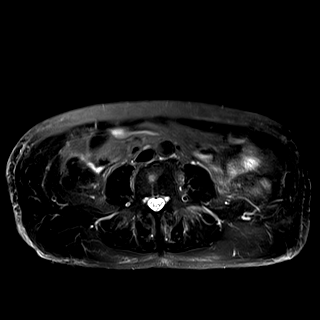

[Series 9: ep2d_[id number]_500_[id number]_trig · axial · 6.0mm · 1.98mm/px · z∈[-61,+148]mm · 3 of 90 slices shown]
[im 1/90]
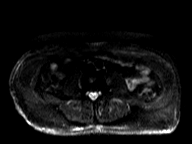
[im 45/90]
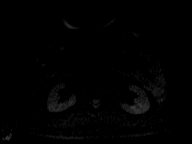
[im 90/90]
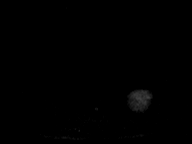

[Series 10: ep2d_[id number]_[id number]_trig_adc · axial · 6.0mm · 1.98mm/px · 1 of 30 slices shown]
[im 1/30]
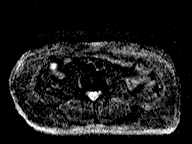

[Series 11: bSSFP · axial · 4.0mm · 0.68mm/px · z∈[-71,+169]mm · 2 of 61 slices shown (1 of 2)]
[im 1/61]
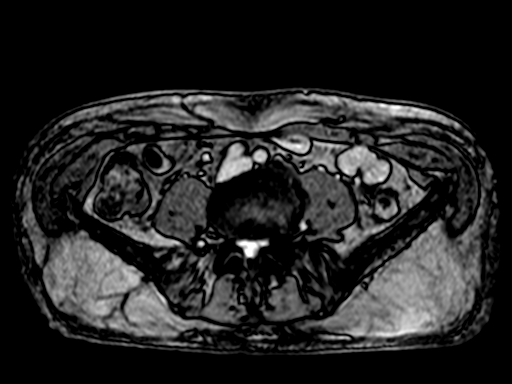
[im 61/61]
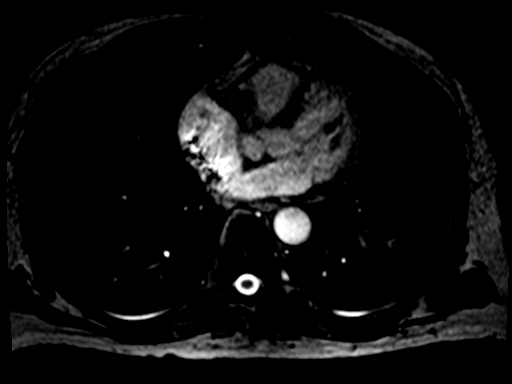

[Series 12: bSSFP · coronal · 5.0mm · 0.78mm/px · 1 of 34 slices shown (2 of 2)]
[im 1/34]
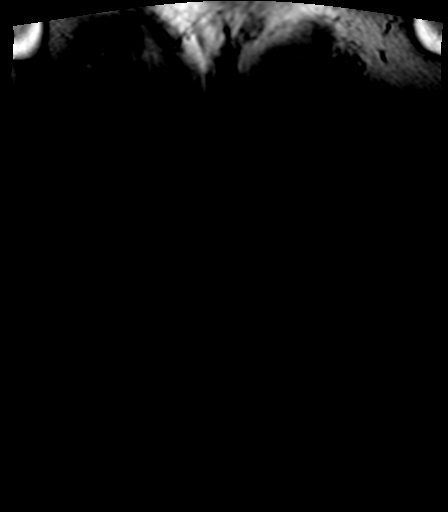

[Series 13: T2 · coronal · 3.0mm · 0.70mm/px · 2 of 48 slices shown (2 of 2)]
[im 1/48]
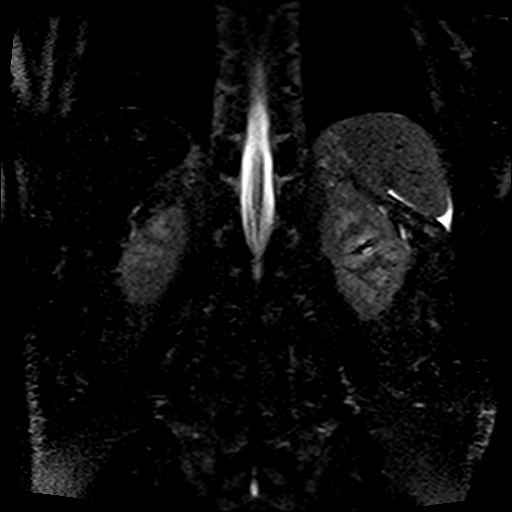
[im 48/48]
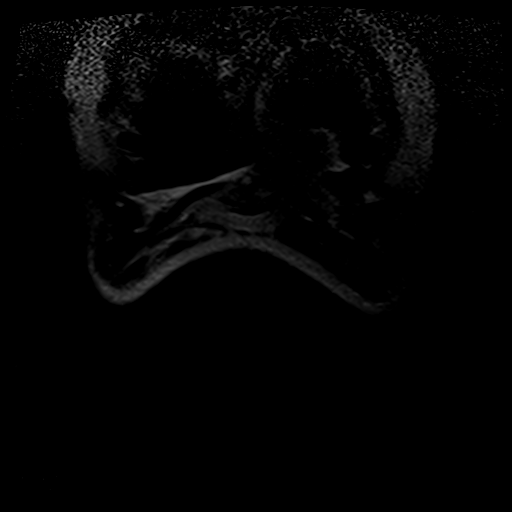

[Series 14: T1 · axial · 6.0mm · 0.74mm/px · z∈[-46,+145]mm · 2 of 60 slices shown]
[im 1/60]
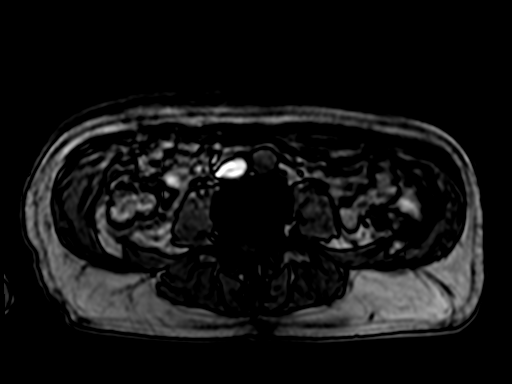
[im 60/60]
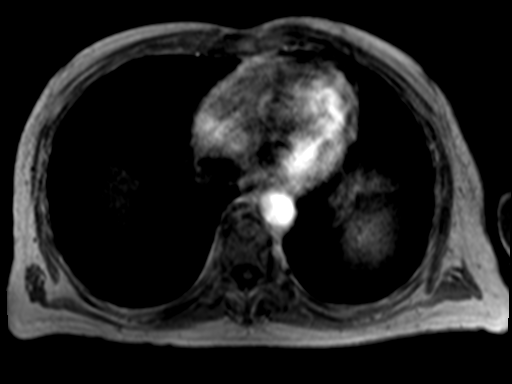

[Series 15: T1 dynamic · axial · non-contrast · 2.5mm · 0.74mm/px · z∈[-47,+151]mm · 3 of 80 slices shown]
[im 1/80]
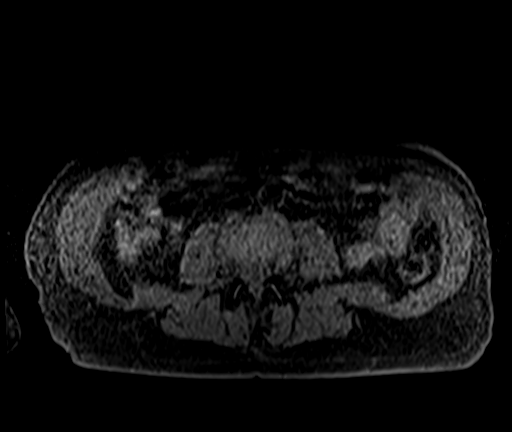
[im 40/80]
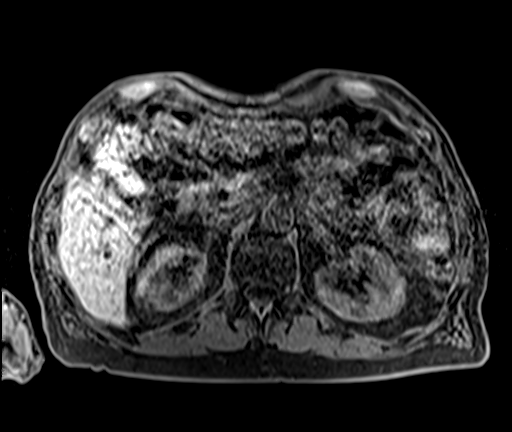
[im 80/80]
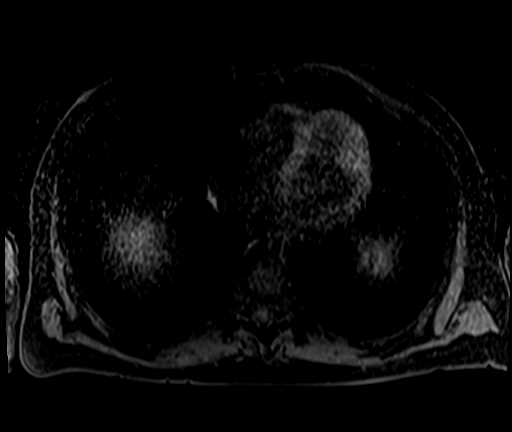

[Series 16: T1 dynamic post-contrast · axial · 2.5mm · 0.74mm/px · z∈[-47,+151]mm · 3 of 80 slices shown (1 of 2)]
[im 1/80]
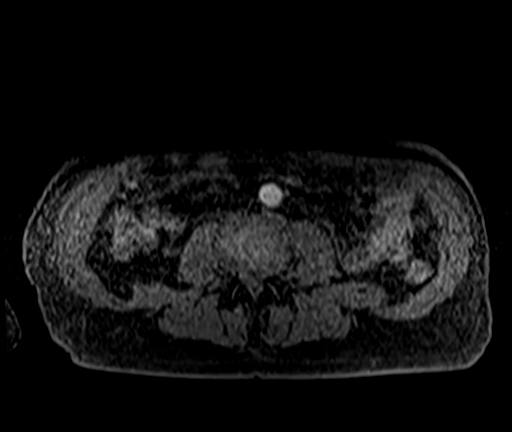
[im 40/80]
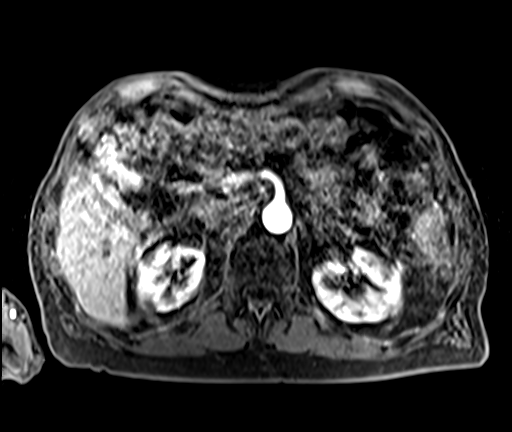
[im 80/80]
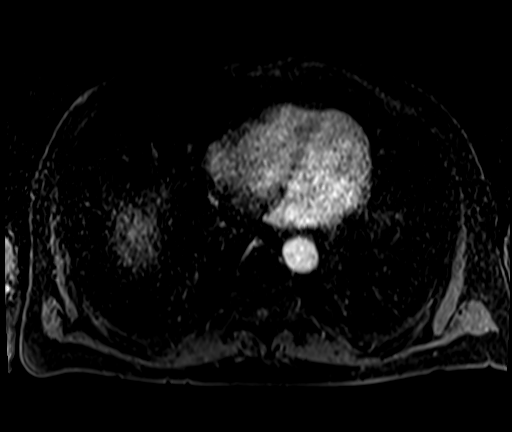

[Series 17: T1 dynamic post-contrast · axial · 2.5mm · 0.74mm/px · z∈[-47,+51]mm · 2 of 80 slices shown (2 of 2)]
[im 1/80]
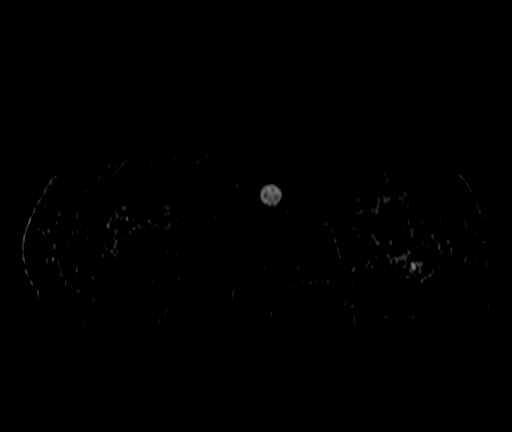
[im 40/80]
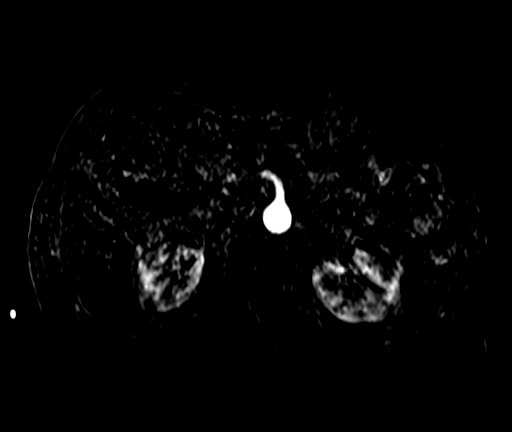

[24 of 48 positions shown; findings below may reference images not displayed]

FINDINGS: Despite efforts by the technologist and patient, motion artifact is
present on today's exam and could not be eliminated. The patient is
hearing impaired and was unable to hear breath hold instructions.
This reduces exam sensitivity and specificity.

Lower chest: Unremarkable

Hepatobiliary: Dilated common hepatic duct with dilated proximal
intrahepatic bile ducts, common hepatic duct diameter 1.6 cm on
image 20 series 5. Common bile duct measures up to 0.9 cm but tapers
distally. Mildly prominent cystic duct remnant as shown on image 13
series 5. There filling defects in the common bile duct shown on
images 14 through 15 of series 5 and also appreciable on images 17
and 18 of series 4, compatible with distal choledocholithiasis.
These correspond with the calcifications seen on recent CT
examination.

Although post-contrast series are notably blurred by motion
artifact, no discrete hepatic parenchymal lesion is identified.

Pancreas: Atrophic pancreas. No dorsal pancreatic duct dilatation or
definite pancreas divisum. No focal pancreatic lesion is identified.
No current substantial peripancreatic edema although there is some
trace fluid interposed between the pancreatic tail and the spleen on
image 12 series 3.

Spleen: Trace perisplenic ascites as noted above, primarily along
the splenic hilum and to a lesser extent laterally. No underlying
splenic lesion is observed.

Adrenals/Urinary Tract: A somewhat cephalad mass of the left adrenal
gland measuring 2.0 by 1.8 cm demonstrates substantial dropout of
signal on out-of-phase images such as image 7 of series [DATE],
compatible with adrenal adenoma. The right adrenal gland appears
normal. No significant renal abnormality observed.

Stomach/Bowel: Unremarkable

Vascular/Lymphatic: Atherosclerosis is present, including aortoiliac
atherosclerotic disease.

Other:  No supplemental non-categorized findings.

Musculoskeletal: Lumbar spondylosis and degenerative disc disease.
IMPRESSION: 1. There are 2 stones in the distal common bile duct, including more
proximal smaller stone, and a larger distal stone that sits in the
vicinity of the ampulla. This is associated with mild intrahepatic
biliary dilatation as well as common hepatic duct dilatation, with
the common bile duct only minimally prominent and tapering distally.
2. No dorsal pancreatic duct dilatation or current findings of
active pancreatitis by imaging. However there is a trace amount of
Peri splenic fluid including some spur fluid along the splenic hilum
near the pancreatic tail.
3. Left adrenal adenoma.
4.  Aortic Atherosclerosis ([id number]).
5. Lumbar spondylosis and degenerative disc disease.
6. Despite efforts by the technologist and patient, motion artifact
is present on today's exam and could not be eliminated. This reduces
exam sensitivity and specificity.

## 2022-08-10 DIAGNOSIS — R7301 Impaired fasting glucose: Secondary | ICD-10-CM | POA: Diagnosis not present

## 2022-08-10 DIAGNOSIS — I10 Essential (primary) hypertension: Secondary | ICD-10-CM | POA: Diagnosis not present

## 2022-08-10 DIAGNOSIS — Z0000 Encounter for general adult medical examination without abnormal findings: Secondary | ICD-10-CM | POA: Diagnosis not present

## 2023-02-04 DIAGNOSIS — R7301 Impaired fasting glucose: Secondary | ICD-10-CM | POA: Diagnosis not present

## 2023-02-04 DIAGNOSIS — I700 Atherosclerosis of aorta: Secondary | ICD-10-CM | POA: Diagnosis not present

## 2023-02-04 DIAGNOSIS — I10 Essential (primary) hypertension: Secondary | ICD-10-CM | POA: Diagnosis not present

## 2023-02-04 DIAGNOSIS — D696 Thrombocytopenia, unspecified: Secondary | ICD-10-CM | POA: Diagnosis not present

## 2023-08-21 DIAGNOSIS — Z23 Encounter for immunization: Secondary | ICD-10-CM | POA: Diagnosis not present

## 2023-08-21 DIAGNOSIS — R7301 Impaired fasting glucose: Secondary | ICD-10-CM | POA: Diagnosis not present

## 2023-08-21 DIAGNOSIS — D696 Thrombocytopenia, unspecified: Secondary | ICD-10-CM | POA: Diagnosis not present

## 2023-08-21 DIAGNOSIS — I700 Atherosclerosis of aorta: Secondary | ICD-10-CM | POA: Diagnosis not present

## 2023-08-21 DIAGNOSIS — Z0000 Encounter for general adult medical examination without abnormal findings: Secondary | ICD-10-CM | POA: Diagnosis not present

## 2023-08-21 DIAGNOSIS — I10 Essential (primary) hypertension: Secondary | ICD-10-CM | POA: Diagnosis not present

## 2024-08-25 DIAGNOSIS — R7303 Prediabetes: Secondary | ICD-10-CM | POA: Diagnosis not present

## 2024-08-25 DIAGNOSIS — Z23 Encounter for immunization: Secondary | ICD-10-CM | POA: Diagnosis not present

## 2024-08-25 DIAGNOSIS — Z1389 Encounter for screening for other disorder: Secondary | ICD-10-CM | POA: Diagnosis not present

## 2024-08-25 DIAGNOSIS — Z13220 Encounter for screening for lipoid disorders: Secondary | ICD-10-CM | POA: Diagnosis not present

## 2024-08-25 DIAGNOSIS — Z136 Encounter for screening for cardiovascular disorders: Secondary | ICD-10-CM | POA: Diagnosis not present

## 2024-08-25 DIAGNOSIS — I10 Essential (primary) hypertension: Secondary | ICD-10-CM | POA: Diagnosis not present

## 2024-08-25 DIAGNOSIS — Z0000 Encounter for general adult medical examination without abnormal findings: Secondary | ICD-10-CM | POA: Diagnosis not present
